# Patient Record
Sex: Male | Born: 1975 | Race: White | Hispanic: No | Marital: Married | Smoking: Current every day smoker
Health system: Southern US, Community
[De-identification: ages and names within clinical notes are randomized; demographics above are authoritative.]

## PROBLEM LIST (undated history)

## (undated) DIAGNOSIS — K6812 Psoas muscle abscess: Secondary | ICD-10-CM

## (undated) DIAGNOSIS — Z87828 Personal history of other (healed) physical injury and trauma: Secondary | ICD-10-CM

## (undated) DIAGNOSIS — A4902 Methicillin resistant Staphylococcus aureus infection, unspecified site: Secondary | ICD-10-CM

## (undated) DIAGNOSIS — F1990 Other psychoactive substance use, unspecified, uncomplicated: Secondary | ICD-10-CM

## (undated) DIAGNOSIS — R7881 Bacteremia: Secondary | ICD-10-CM

## (undated) DIAGNOSIS — A4159 Other Gram-negative sepsis: Secondary | ICD-10-CM

## (undated) DIAGNOSIS — B379 Candidiasis, unspecified: Secondary | ICD-10-CM

## (undated) HISTORY — DX: Other gram-negative sepsis: A41.59

## (undated) HISTORY — PX: SKIN BIOPSY: SHX1

## (undated) HISTORY — DX: Other psychoactive substance use, unspecified, uncomplicated: F19.90

## (undated) HISTORY — DX: Candidiasis, unspecified: B37.9

## (undated) HISTORY — PX: OTHER SURGICAL HISTORY: SHX169

---

## 2002-09-11 ENCOUNTER — Emergency Department (HOSPITAL_COMMUNITY): Admission: EM | Admit: 2002-09-11 | Discharge: 2002-09-11 | Payer: Self-pay | Admitting: Emergency Medicine

## 2003-05-25 ENCOUNTER — Inpatient Hospital Stay (HOSPITAL_COMMUNITY): Admission: AC | Admit: 2003-05-25 | Discharge: 2003-05-26 | Payer: Self-pay

## 2003-06-09 ENCOUNTER — Emergency Department (HOSPITAL_COMMUNITY): Admission: EM | Admit: 2003-06-09 | Discharge: 2003-06-09 | Payer: Self-pay | Admitting: Emergency Medicine

## 2003-07-23 ENCOUNTER — Encounter: Payer: Self-pay | Admitting: Emergency Medicine

## 2003-07-23 ENCOUNTER — Emergency Department (HOSPITAL_COMMUNITY): Admission: EM | Admit: 2003-07-23 | Discharge: 2003-07-23 | Payer: Self-pay | Admitting: Emergency Medicine

## 2007-08-30 ENCOUNTER — Emergency Department (HOSPITAL_COMMUNITY): Admission: EM | Admit: 2007-08-30 | Discharge: 2007-08-30 | Payer: Self-pay | Admitting: Emergency Medicine

## 2007-08-31 ENCOUNTER — Emergency Department (HOSPITAL_COMMUNITY): Admission: EM | Admit: 2007-08-31 | Discharge: 2007-08-31 | Payer: Self-pay | Admitting: Emergency Medicine
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
## 2012-02-10 ENCOUNTER — Emergency Department (HOSPITAL_COMMUNITY): Payer: Self-pay

## 2012-02-10 ENCOUNTER — Emergency Department (HOSPITAL_COMMUNITY)
Admission: EM | Admit: 2012-02-10 | Discharge: 2012-02-11 | Disposition: A | Discharge status: Home / Self Care | Payer: Self-pay | Attending: Emergency Medicine | Admitting: Emergency Medicine

## 2012-02-10 ENCOUNTER — Encounter (HOSPITAL_COMMUNITY): Payer: Self-pay | Admitting: *Deleted

## 2012-02-10 DIAGNOSIS — S8001XA Contusion of right knee, initial encounter: Secondary | ICD-10-CM

## 2012-02-10 DIAGNOSIS — F17200 Nicotine dependence, unspecified, uncomplicated: Secondary | ICD-10-CM | POA: Insufficient documentation

## 2012-02-10 DIAGNOSIS — S8000XA Contusion of unspecified knee, initial encounter: Secondary | ICD-10-CM | POA: Insufficient documentation

## 2012-02-10 DIAGNOSIS — Y990 Civilian activity done for income or pay: Secondary | ICD-10-CM | POA: Insufficient documentation

## 2012-02-10 DIAGNOSIS — Y9289 Other specified places as the place of occurrence of the external cause: Secondary | ICD-10-CM | POA: Insufficient documentation

## 2012-02-10 DIAGNOSIS — M25569 Pain in unspecified knee: Secondary | ICD-10-CM | POA: Insufficient documentation

## 2012-02-10 DIAGNOSIS — W138XXA Fall from, out of or through other building or structure, initial encounter: Secondary | ICD-10-CM | POA: Insufficient documentation

## 2012-02-10 NOTE — ED Notes (Signed)
Pt reports he fell off of a roof 2 days ago, no c/o pain to rt knee

## 2012-02-11 MED ORDER — HYDROCODONE-ACETAMINOPHEN 5-325 MG PO TABS
2.0000 | ORAL_TABLET | Freq: Once | ORAL | Status: AC
  Administered 2012-02-11: 2 via ORAL
  Filled 2012-02-11: qty 2

## 2012-02-11 MED ORDER — HYDROCODONE-ACETAMINOPHEN 5-500 MG PO TABS: 1.0000 | ORAL_TABLET | Freq: Four times a day (QID) | ORAL | Status: AC | PRN

## 2012-02-11 NOTE — ED Notes (Signed)
Discharge instructions given and reviewed with patient.  Prescription given for Vicodin; effects and use explained.  Patient verbalized understanding of sedating effects of Vicodin.  Patient ambulatory; discharged home in good condition. 

## 2012-02-11 NOTE — Discharge Instructions (Signed)
Bone Bruise  A bone bruise is a small hidden fracture of the bone. It typically occurs with bones located close to the surface of the skin.  SYMPTOMS  The pain lasts longer than a normal bruise.   The bruised area is difficult to use.   There may be discoloration or swelling of the bruised area.   When a bone bruise is found with injury to the anterior cruciate ligament (in the knee) there is often an increased:   Amount of fluid in the knee   Time the fluid in the knee lasts.   Number of days until you are walking normally and regaining the motion you had before the injury.   Number of days with pain from the injury.  DIAGNOSIS  It can only be seen on X-rays known as MRIs. This stands for magnetic resonance imaging. A regular X-ray taken of a bone bruise would appear to be normal. A bone bruise is a common injury in the knee and the heel bone (calcaneus). The problems are similar to those produced by stress fractures, which are bone injuries caused by overuse. A bone bruise may also be a sign of other injuries. For example, bone bruises are commonly found where an anterior cruciate ligament (ACL) in the knee has been pulled away from the bone (ruptured). A ligament is a tough fibrous material that connects bones together to make our joints stable. Bruises of the bone last a lot longer than bruises of the muscle or tissues beneath the skin. Bone bruises can last from days to months and are often more severe and painful than other bruises. TREATMENT Because bone bruises are sudden injuries you cannot often prevent them, other than by being extremely careful. Some things you can do to improve the condition are:  Apply ice to the sore area for 15 to 20 minutes, 3 to 4 times per day while awake for the first 2 days. Put the ice in a plastic bag, and place a towel between the bag of ice and your skin.   Keep your bruised area raised (elevated) when possible to lessen swelling.   For activity:     Use crutches when necessary; do not put weight on the injured leg until you are no longer tender.   You may walk on your affected part as the pain allows, or as instructed.   Start weight bearing gradually on the bruised part.   Continue to use crutches or a cane until you can stand without causing pain, or as instructed.   If a plaster splint was applied, wear the splint until you are seen for a follow-up examination. Rest it on nothing harder than a pillow the first 24 hours. Do not put weight on it. Do not get it wet. You may take it off to take a shower or bath.   If an air splint was applied, more air may be blown into or out of the splint as needed for comfort. You may take it off at night and to take a shower or bath.   Wiggle your toes in the splint several times per day if you are able.   You may have been given an elastic bandage to use with the plaster splint or alone. The splint is too tight if you have numbness, tingling or if your foot becomes cold and blue. Adjust the bandage to make it comfortable.   Only take over-the-counter or prescription medicines for pain, discomfort, or fever as directed by   discomfort, or fever as directed by your caregiver.   Follow all instructions for follow up with your caregiver. This includes any orthopedic referrals, physical therapy, and rehabilitation. Any delay in obtaining necessary care could result in a delay or failure of the bones to heal.  SEEK MEDICAL CARE IF:    You have an increase in bruising, swelling, or pain.   You notice coldness of your toes.   You do not get pain relief with medications.  SEEK IMMEDIATE MEDICAL CARE IF:    Your toes are numb or blue.   You have severe pain not controlled with medications.   If any of the problems that caused you to seek care are becoming worse.  Document Released: 01/29/2004 Document Revised: 10/28/2011 Document Reviewed: 06/12/2008  [employer] Patient Information 2012 [employer].

## 2012-02-11 NOTE — ED Provider Notes (Signed)
History     CSN: [id number]  Arrival date & time 02/10/12  2314   First MD Initiated Contact with Patient 02/11/12 0026      Chief Complaint  Patient presents with  . Knee Pain    (Consider location/radiation/quality/duration/timing/severity/associated sxs/prior treatment) HPI Comments: States fell off roof at work 2 days ago.  Landed on knee.  Pain getting worse.    Patient is a 36 y.o. male presenting with knee pain. The history is provided by the patient.  Knee Pain This is a new problem. The current episode started 2 days ago. The problem occurs constantly. The problem has been gradually worsening. The symptoms are aggravated by twisting and walking. The symptoms are relieved by nothing. He has tried nothing for the symptoms.    History reviewed. No pertinent past medical history.  History reviewed. No pertinent past surgical history.  No family history on file.  History  Substance Use Topics  . Smoking status: Current Everyday Smoker  . Smokeless tobacco: Not on file  . Alcohol Use: No      Review of Systems  All other systems reviewed and are negative.    Allergies  Review of patient's allergies indicates no known allergies.  Home Medications  No current outpatient prescriptions on file.  BP 129/77  Pulse 103  Temp(Src) 97.9 F (36.6 C) (Oral)  Resp 20  Ht 6' (1.829 m)  Wt 215 lb (97.523 kg)  BMI 29.16 kg/m2  SpO2 98%  Physical Exam  Nursing note and vitals reviewed. Constitutional: He is oriented to person, place, and time. He appears well-developed and well-nourished.  HENT:  Head: Normocephalic and atraumatic.  Neck: Normal range of motion. Neck supple.  Musculoskeletal:       The right knee appears grossly normal.  There is no deformity or effusion.  There is pain with full extension.  It is stable AP and laterally.  The AP drawer tests are negative.    Neurological: He is alert and oriented to person, place, and time.  Skin: Skin is warm  and dry.    ED Course  Procedures (including critical care time)  Labs Reviewed - No data to display No results found.   No diagnosis found.    MDM  To follow up if not improving in 1-2 weeks.        Geoffery Lyons, MD 02/11/12 [id number]

## 2012-02-24 ENCOUNTER — Encounter (HOSPITAL_COMMUNITY): Payer: Self-pay | Admitting: *Deleted

## 2012-02-24 ENCOUNTER — Emergency Department (HOSPITAL_COMMUNITY)
Admission: EM | Admit: 2012-02-24 | Discharge: 2012-02-24 | Disposition: A | Discharge status: Home / Self Care | Payer: Self-pay | Attending: Emergency Medicine | Admitting: Emergency Medicine

## 2012-02-24 DIAGNOSIS — M25569 Pain in unspecified knee: Secondary | ICD-10-CM | POA: Insufficient documentation

## 2012-02-24 DIAGNOSIS — M25561 Pain in right knee: Secondary | ICD-10-CM

## 2012-02-24 DIAGNOSIS — F17200 Nicotine dependence, unspecified, uncomplicated: Secondary | ICD-10-CM | POA: Insufficient documentation

## 2012-02-24 MED ORDER — PREDNISONE 10 MG PO TABS: ORAL_TABLET | ORAL | Status: DC

## 2012-02-24 MED ORDER — OXYCODONE-ACETAMINOPHEN 5-325 MG PO TABS
1.0000 | ORAL_TABLET | Freq: Once | ORAL | Status: AC
  Administered 2012-02-24: 1 via ORAL
  Filled 2012-02-24: qty 1

## 2012-02-24 MED ORDER — OXYCODONE-ACETAMINOPHEN 5-325 MG PO TABS: 1.0000 | ORAL_TABLET | ORAL | Status: DC | PRN

## 2012-02-24 NOTE — ED Provider Notes (Signed)
History     CSN: [id number]  Arrival date & time 02/24/12  2050   First MD Initiated Contact with Patient 02/24/12 2059      Chief Complaint  Patient presents with  . Knee Pain    (Consider location/radiation/quality/duration/timing/severity/associated sxs/prior treatment) HPI Comments: Patient fell off a roof last month landing directly on his right knee.  He was seen here for this injury and his initial xray was negative for fracture.  He continues to have pain in the knee,  Especially when he works,  The pain worsens after on his feet for several hours.   Patient is a 36 y.o. male presenting with knee pain. The history is provided by the patient.  Knee Pain This is a chronic problem. The current episode started more than 1 month ago. The problem occurs daily. The problem has been unchanged. Associated symptoms include arthralgias and joint swelling. Pertinent negatives include no abdominal pain, chest pain, fever, neck pain, numbness, rash or weakness. The symptoms are aggravated by walking, twisting and standing. He has tried oral narcotics and NSAIDs for the symptoms. The treatment provided mild relief.    History reviewed. No pertinent past medical history.  History reviewed. No pertinent past surgical history.  Family History  Problem Relation Age of Onset  . Cancer Father     History  Substance Use Topics  . Smoking status: Current Everyday Smoker  . Smokeless tobacco: Not on file  . Alcohol Use: No      Review of Systems  Constitutional: Negative for fever.  HENT: Negative for neck pain.   Respiratory: Negative for shortness of breath.   Cardiovascular: Negative for chest pain.  Gastrointestinal: Negative for abdominal pain.  Musculoskeletal: Positive for joint swelling and arthralgias.  Skin: Negative.  Negative for rash and wound.  Neurological: Negative for weakness and numbness.  Psychiatric/Behavioral: Negative.     Allergies  Vicodin  Home  Medications   Current Outpatient Rx  Name Route Sig Dispense Refill  . IBUPROFEN 200 MG PO TABS Oral Take 800 mg by mouth as needed. For pain    . OXYCODONE-ACETAMINOPHEN 5-325 MG PO TABS Oral Take 1 tablet by mouth every 4 (four) hours as needed for pain. 15 tablet 0  . PREDNISONE 10 MG PO TABS  6, 5, 4, 3, 2 then 1 tablet by mouth daily for 6 days total. 21 tablet 0    BP 139/84  Pulse 86  Temp(Src) 97.9 F (36.6 C) (Oral)  Resp 20  Ht 6' (1.829 m)  Wt 210 lb (95.255 kg)  BMI 28.48 kg/m2  SpO2 100%  Physical Exam  Nursing note and vitals reviewed. Constitutional: He is oriented to person, place, and time. He appears well-developed and well-nourished.  HENT:  Head: Normocephalic.  Eyes: Conjunctivae are normal.  Neck: Normal range of motion.  Cardiovascular: Normal rate and intact distal pulses.  Exam reveals no decreased pulses.   Pulses:      Dorsalis pedis pulses are 2+ on the right side, and 2+ on the left side.       Posterior tibial pulses are 2+ on the right side, and 2+ on the left side.  Pulmonary/Chest: Effort normal.  Musculoskeletal: He exhibits tenderness. He exhibits no edema.       Right knee: He exhibits bony tenderness. He exhibits normal range of motion, no effusion, no deformity, no LCL laxity and no MCL laxity. tenderness found. Lateral joint line tenderness noted.  Neurological: He is alert and oriented  to person, place, and time. No sensory deficit.  Skin: Skin is warm, dry and intact.    ED Course  Procedures (including critical care time)  Labs Reviewed - No data to display No results found.   1. Knee pain, right       MDM  Pt given prednisone taper,  Percocet for pain relief.  Referral to Dr. Hilda Lias for further management.          Candis Musa, PA 02/25/12 1151

## 2012-02-24 NOTE — ED Notes (Signed)
Fell off roof 1 month ago ,injurying rt knee.  Here because it continues to hurt.

## 2012-02-24 NOTE — Discharge Instructions (Signed)
Knee Pain The knee is the complex joint between your thigh and your lower leg. It is made up of bones, tendons, ligaments, and cartilage. The bones that make up the knee are:  The femur in the thigh.   The tibia and fibula in the lower leg.   The patella or kneecap riding in the groove on the lower femur.  CAUSES  Knee pain is a common complaint with many causes. A few of these causes are:  Injury, such as:   A ruptured ligament or tendon injury.   Torn cartilage.   Medical conditions, such as:   Gout   Arthritis   Infections   Overuse, over training or overdoing a physical activity.  Knee pain can be minor or severe. Knee pain can accompany debilitating injury. Minor knee problems often respond well to self-care measures or get well on their own. More serious injuries may need medical intervention or even surgery. SYMPTOMS The knee is complex. Symptoms of knee problems can vary widely. Some of the problems are:  Pain with movement and weight bearing.   Swelling and tenderness.   Buckling of the knee.   Inability to straighten or extend your knee.   Your knee locks and you cannot straighten it.   Warmth and redness with pain and fever.   Deformity or dislocation of the kneecap.  DIAGNOSIS  Determining what is wrong may be very straight forward such as when there is an injury. It can also be challenging because of the complexity of the knee. Tests to make a diagnosis may include:  Your caregiver taking a history and doing a physical exam.   Routine X-rays can be used to rule out other problems. X-rays will not reveal a cartilage tear. Some injuries of the knee can be diagnosed by:   Arthroscopy a surgical technique by which a small video camera is inserted through tiny incisions on the sides of the knee. This procedure is used to examine and repair internal knee joint problems. Tiny instruments can be used during arthroscopy to repair the torn knee cartilage  (meniscus).   Arthrography is a radiology technique. A contrast liquid is directly injected into the knee joint. Internal structures of the knee joint then become visible on X-ray film.   An MRI scan is a non x-ray radiology procedure in which magnetic fields and a computer produce two- or three-dimensional images of the inside of the knee. Cartilage tears are often visible using an MRI scanner. MRI scans have largely replaced arthrography in diagnosing cartilage tears of the knee.   Blood work.   Examination of the fluid that helps to lubricate the knee joint (synovial fluid). This is done by taking a sample out using a needle and a syringe.  TREATMENT The treatment of knee problems depends on the cause. Some of these treatments are:  Depending on the injury, proper casting, splinting, surgery or physical therapy care will be needed.   Give yourself adequate recovery time. Do not overuse your joints. If you begin to get sore during workout routines, back off. Slow down or do fewer repetitions.   For repetitive activities such as cycling or running, maintain your strength and nutrition.   Alternate muscle groups. For example if you are a weight lifter, work the upper body on one day and the lower body the next.   Either tight or weak muscles do not give the proper support for your knee. Tight or weak muscles do not absorb the stress placed   on the knee joint. Keep the muscles surrounding the knee strong.   Take care of mechanical problems.   If you have flat feet, orthotics or special shoes may help. See your caregiver if you need help.   Arch supports, sometimes with wedges on the inner or outer aspect of the heel, can help. These can shift pressure away from the side of the knee most bothered by osteoarthritis.   A brace called an "unloader" brace also may be used to help ease the pressure on the most arthritic side of the knee.   If your caregiver has prescribed crutches, braces,  wraps or ice, use as directed. The acronym for this is PRICE. This means protection, rest, ice, compression and elevation.   Nonsteroidal anti-inflammatory drugs (NSAID's), can help relieve pain. But if taken immediately after an injury, they may actually increase swelling. Take NSAID's with food in your stomach. Stop them if you develop stomach problems. Do not take these if you have a history of ulcers, stomach pain or bleeding from the bowel. Do not take without your caregiver's approval if you have problems with fluid retention, heart failure, or kidney problems.   For ongoing knee problems, physical therapy may be helpful.   Glucosamine and chondroitin are over-the-counter dietary supplements. Both may help relieve the pain of osteoarthritis in the knee. These medicines are different from the usual anti-inflammatory drugs. Glucosamine may decrease the rate of cartilage destruction.   Injections of a corticosteroid drug into your knee joint may help reduce the symptoms of an arthritis flare-up. They may provide pain relief that lasts a few months. You may have to wait a few months between injections. The injections do have a small increased risk of infection, water retention and elevated blood sugar levels.   Hyaluronic acid injected into damaged joints may ease pain and provide lubrication. These injections may work by reducing inflammation. A series of shots may give relief for as long as 6 months.   Topical painkillers. Applying certain ointments to your skin may help relieve the pain and stiffness of osteoarthritis. Ask your pharmacist for suggestions. Many over the-counter products are approved for temporary relief of arthritis pain.   In some countries, doctors often prescribe topical NSAID's for relief of chronic conditions such as arthritis and tendinitis. A review of treatment with NSAID creams found that they worked as well as oral medications but without the serious side effects.    PREVENTION  Maintain a healthy weight. Extra pounds put more strain on your joints.   Get strong, stay limber. Weak muscles are a common cause of knee injuries. Stretching is important. Include flexibility exercises in your workouts.   Be smart about exercise. If you have osteoarthritis, chronic knee pain or recurring injuries, you may need to change the way you exercise. This does not mean you have to stop being active. If your knees ache after jogging or playing basketball, consider switching to swimming, water aerobics or other low-impact activities, at least for a few days a week. Sometimes limiting high-impact activities will provide relief.   Make sure your shoes fit well. Choose footwear that is right for your sport.   Protect your knees. Use the proper gear for knee-sensitive activities. Use kneepads when playing volleyball or laying carpet. Buckle your seat belt every time you drive. Most shattered kneecaps occur in car accidents.   Rest when you are tired.  SEEK MEDICAL CARE IF:  You have knee pain that is continual and does not   seem to be getting better.  SEEK IMMEDIATE MEDICAL CARE IF:  Your knee joint feels hot to the touch and you have a high fever. MAKE SURE YOU:   Understand these instructions.   Will watch your condition.   Will get help right away if you are not doing well or get worse.  Document Released: 09/05/2007 Document Revised: 10/28/2011 Document Reviewed: 09/05/2007 [facility] Patient Information 2012 [place], [place].   As discussed call Dr. Hilda Lias for further management if your knee injury.  You may try the prednisone taper which may help relieve inflammation.  Use oxycodone before bed time for pain relief.  You may use at other times also, but use caution as this will make you drowsy.  Do not drive within 4 hours of taking this medication.

## 2012-02-27 NOTE — ED Provider Notes (Signed)
Evaluation and management procedures were performed by the PA/NP/Resident Physician under my supervision/collaboration.   Catarino Vold D Khamia Stambaugh, MD 02/27/12 1605 

## 2012-02-28 ENCOUNTER — Encounter (HOSPITAL_COMMUNITY): Payer: Self-pay | Admitting: *Deleted

## 2012-02-28 ENCOUNTER — Emergency Department (HOSPITAL_COMMUNITY)
Admission: EM | Admit: 2012-02-28 | Discharge: 2012-02-28 | Disposition: A | Discharge status: Home / Self Care | Payer: Self-pay | Attending: Emergency Medicine | Admitting: Emergency Medicine

## 2012-02-28 DIAGNOSIS — M25469 Effusion, unspecified knee: Secondary | ICD-10-CM | POA: Insufficient documentation

## 2012-02-28 DIAGNOSIS — S8990XA Unspecified injury of unspecified lower leg, initial encounter: Secondary | ICD-10-CM | POA: Insufficient documentation

## 2012-02-28 DIAGNOSIS — Y990 Civilian activity done for income or pay: Secondary | ICD-10-CM | POA: Insufficient documentation

## 2012-02-28 DIAGNOSIS — S8991XA Unspecified injury of right lower leg, initial encounter: Secondary | ICD-10-CM

## 2012-02-28 DIAGNOSIS — W138XXA Fall from, out of or through other building or structure, initial encounter: Secondary | ICD-10-CM | POA: Insufficient documentation

## 2012-02-28 DIAGNOSIS — M25569 Pain in unspecified knee: Secondary | ICD-10-CM | POA: Insufficient documentation

## 2012-02-28 DIAGNOSIS — F17200 Nicotine dependence, unspecified, uncomplicated: Secondary | ICD-10-CM | POA: Insufficient documentation

## 2012-02-28 MED ORDER — IBUPROFEN 600 MG PO TABS: 600.0000 mg | ORAL_TABLET | Freq: Four times a day (QID) | ORAL | Status: AC | PRN

## 2012-02-28 MED ORDER — OXYCODONE-ACETAMINOPHEN 5-325 MG PO TABS: 1.0000 | ORAL_TABLET | ORAL | Status: AC | PRN

## 2012-02-28 NOTE — ED Notes (Signed)
Here for recheck of knee, seen here for same and has been referred to ortho.

## 2012-02-28 NOTE — Discharge Instructions (Signed)
Expect to here from Camillia Herter,  Our nursing director,  Who may be able to assist you with getting the specialty orthopedic care you need.  Also,  You may want to contact Newman Pies ,  Our case manager at [phone] to see if you are eligible for a [facility] assistance program for this injury.

## 2012-02-28 NOTE — ED Provider Notes (Signed)
History     CSN: [id number]  Arrival date & time 02/28/12  1825   First MD Initiated Contact with Patient 02/28/12 1943      Chief Complaint  Patient presents with  . Knee Pain    (Consider location/radiation/quality/duration/timing/severity/associated sxs/prior treatment) HPI Comments: Patient presents with acute right knee pain which he sustained one month ago when he fell off a roof at work.  He has been trying to rest the knee as much as possible, and is wearing a brace which he purchased for support.  His pain is not improved he has significant swelling which is worse towards the end of the day and he feels that his knee is unstable at times.  He has attempted to see both orthopedists in [place],  but has been unable to come up with the co-pay in order to get his medical care.  He is unable to work secondary to pain and swelling.  He is very  frustrated today as he wants to go back to work but has been unable to get the medical care that he needs.  He denies any new injury since his original x-rays completed here.  He does take ibuprofen, has used a course of prednisone and uses when necessary oxycodone which has given a fleeting relief of pain.  Patient is a 36 y.o. male presenting with knee pain. The history is provided by the patient and the spouse.  Knee Pain This is a chronic problem. Episode onset: Patient presents for further assistance of right knee pain which he sustained an injury when he fell off a roof over one month ago. The problem occurs constantly. The problem has been unchanged. Associated symptoms include arthralgias and joint swelling. Pertinent negatives include no abdominal pain, chest pain, congestion, fever, headaches, nausea, neck pain, numbness, rash, sore throat or weakness. The symptoms are aggravated by twisting, walking and standing. He has tried NSAIDs and oral narcotics (He is also wearing a brace for support of his right knee.) for the symptoms. The treatment  provided no relief.    History reviewed. No pertinent past medical history.  History reviewed. No pertinent past surgical history.  Family History  Problem Relation Age of Onset  . Cancer Father     History  Substance Use Topics  . Smoking status: Current Everyday Smoker  . Smokeless tobacco: Not on file  . Alcohol Use: No      Review of Systems  Constitutional: Negative for fever.  HENT: Negative for congestion, sore throat and neck pain.   Eyes: Negative.   Respiratory: Negative for chest tightness and shortness of breath.   Cardiovascular: Negative for chest pain.  Gastrointestinal: Negative for nausea and abdominal pain.  Genitourinary: Negative.   Musculoskeletal: Positive for joint swelling and arthralgias.  Skin: Negative.  Negative for rash and wound.  Neurological: Negative for dizziness, weakness, light-headedness, numbness and headaches.  Hematological: Negative.   Psychiatric/Behavioral: Negative.     Allergies  Vicodin  Home Medications   Current Outpatient Rx  Name Route Sig Dispense Refill  . IBUPROFEN 200 MG PO TABS Oral Take 800 mg by mouth as needed. For pain    . IBUPROFEN 600 MG PO TABS Oral Take 1 tablet (600 mg total) by mouth every 6 (six) hours as needed for pain. 30 tablet 0  . OXYCODONE-ACETAMINOPHEN 5-325 MG PO TABS Oral Take 1 tablet by mouth every 4 (four) hours as needed for pain. 15 tablet 0  . OXYCODONE-ACETAMINOPHEN 5-325 MG PO TABS  Oral Take 1 tablet by mouth every 4 (four) hours as needed for pain. 30 tablet 0  . PREDNISONE 10 MG PO TABS  6, 5, 4, 3, 2 then 1 tablet by mouth daily for 6 days total. 21 tablet 0    BP 127/93  Pulse 100  Temp(Src) 98.5 F (36.9 C) (Oral)  Resp 20  Ht 6' (1.829 m)  Wt 210 lb (95.255 kg)  BMI 28.48 kg/m2  SpO2 100%  Physical Exam  Nursing note and vitals reviewed. Constitutional: He is oriented to person, place, and time. He appears well-developed and well-nourished.  HENT:  Head:  Normocephalic.  Eyes: Conjunctivae are normal.  Neck: Normal range of motion.  Cardiovascular: Normal rate and intact distal pulses.  Exam reveals no decreased pulses.   Pulses:      Dorsalis pedis pulses are 2+ on the right side, and 2+ on the left side.       Less than 2 second cap refill.  Pulmonary/Chest: Effort normal.  Musculoskeletal: He exhibits edema and tenderness.       Right knee: He exhibits bony tenderness. He exhibits normal meniscus. tenderness found. Lateral joint line tenderness noted. No MCL and no LCL tenderness noted.  Neurological: He is alert and oriented to person, place, and time. No sensory deficit.  Skin: Skin is warm, dry and intact.    ED Course  Procedures (including critical care time)  Labs Reviewed - No data to display No results found.   1. Right knee injury       MDM  Short-term assistance for patient given including renewal of prescription for oxycodone and ibuprofen 800 mg tablets.  Patient placed in knee immobilizer and given crutches for improved protection of knee while he is attempting to get resolution of this injury.  Also  advised patient that we will let our  nursing director be aware of his difficulty getting in with the specialist and she may be able to help him with this problem.  Patient also given [facility] Parents phone number for assistance for our hospital financial services.    Candis Musa, PA 02/28/12 2043  Candis Musa, PA 02/28/12 2044  Candis Musa, PA 02/28/12 2046

## 2012-02-29 NOTE — ED Provider Notes (Signed)
Medical screening examination/treatment/procedure(s) were performed by non-physician practitioner and as supervising physician I was immediately available for consultation/collaboration. Devoria Albe, MD, FACEP   Ward Givens, MD 02/29/12 [phone]

## 2012-03-10 ENCOUNTER — Encounter (HOSPITAL_COMMUNITY): Payer: Self-pay | Admitting: Emergency Medicine

## 2012-03-10 ENCOUNTER — Emergency Department (HOSPITAL_COMMUNITY)
Admission: EM | Admit: 2012-03-10 | Discharge: 2012-03-11 | Disposition: A | Discharge status: Home / Self Care | Payer: Self-pay | Attending: Emergency Medicine | Admitting: Emergency Medicine

## 2012-03-10 DIAGNOSIS — W138XXA Fall from, out of or through other building or structure, initial encounter: Secondary | ICD-10-CM | POA: Insufficient documentation

## 2012-03-10 DIAGNOSIS — F17200 Nicotine dependence, unspecified, uncomplicated: Secondary | ICD-10-CM | POA: Insufficient documentation

## 2012-03-10 DIAGNOSIS — S8990XA Unspecified injury of unspecified lower leg, initial encounter: Secondary | ICD-10-CM | POA: Insufficient documentation

## 2012-03-10 DIAGNOSIS — S8991XA Unspecified injury of right lower leg, initial encounter: Secondary | ICD-10-CM

## 2012-03-10 DIAGNOSIS — S99929A Unspecified injury of unspecified foot, initial encounter: Secondary | ICD-10-CM | POA: Insufficient documentation

## 2012-03-10 NOTE — ED Notes (Signed)
Pt reports R knee pain described as stabbing for a little over a month after falling off roof; pt reports it has been getting worse since injury; pt reports having xray done immediately after injury- reports was told it was bruised; pt has been using brace at home; was referred to orthopedic but unable to go d/t cost; pt able to bend knee; wants MRI

## 2012-03-11 MED ORDER — OXYCODONE-ACETAMINOPHEN 5-325 MG PO TABS: 2.0000 | ORAL_TABLET | ORAL | Status: DC | PRN

## 2012-03-11 NOTE — ED Provider Notes (Signed)
Medical screening examination/treatment/procedure(s) were performed by non-physician practitioner and as supervising physician I was immediately available for consultation/collaboration.  Aissata Wilmore, MD 03/11/12 0342 

## 2012-03-11 NOTE — ED Provider Notes (Signed)
History     CSN: [id number]  Arrival date & time 03/10/12  2319   First MD Initiated Contact with Patient 03/11/12 [phone]      Chief Complaint  Patient presents with  . Knee Pain    (Consider location/radiation/quality/duration/timing/severity/associated sxs/prior treatment) Patient is a 36 y.o. male presenting with knee pain. The history is provided by the patient and the spouse.  Knee Pain This is a new problem. The current episode started 1 to 4 weeks ago. Associated symptoms comments: He fell from a roof approximately one month ago and landed on flexed right knee. He was evaluated and reports the knee x-ray did not show any fractures. He continues to have severe pain in the knee with bending. He has been able to support his weight and uses the knee immobilizer daily while working. The pain is worse at night when he lies down, and when the knee remains bent for periods of time and he straightens. No swelling.Marland Kitchen    History reviewed. No pertinent past medical history.  Past Surgical History  Procedure Date  . Skin biopsy     Family History  Problem Relation Age of Onset  . Cancer Father     History  Substance Use Topics  . Smoking status: Current Everyday Smoker -- 1.0 packs/day  . Smokeless tobacco: Not on file  . Alcohol Use: No      Review of Systems  Respiratory: Negative.  Negative for shortness of breath.   Musculoskeletal:       See HPI  Skin: Negative.     Allergies  Vicodin  Home Medications   Current Outpatient Rx  Name Route Sig Dispense Refill  . ACETAMINOPHEN 500 MG PO TABS Oral Take 1,000 mg by mouth every 6 (six) hours as needed. For pain    . ASPIRIN-ACETAMINOPHEN-CAFFEINE 250-250-65 MG PO TABS Oral Take 1 tablet by mouth every 6 (six) hours as needed. For pain    . IBUPROFEN 600 MG PO TABS Oral Take 1,200 mg by mouth every 6 (six) hours as needed. For pain      BP 139/96  Pulse 82  Temp(Src) 98.2 F (36.8 C) (Oral)  Resp 20  SpO2  100%  Physical Exam  Constitutional: He appears well-developed and well-nourished.  Neck: Normal range of motion.  Pulmonary/Chest: Effort normal.  Musculoskeletal: Normal range of motion.       Right knee unremarkable in appearance. Joint stable. Point tenderness to lower portion of anterolateral aspect. No calf or thigh tenderness.   Neurological: He is alert.  Skin: Skin is warm and dry.  Psychiatric: He has a normal mood and affect.    ED Course  Procedures (including critical care time)  Labs Reviewed - No data to display No results found.   No diagnosis found. 1. Knee pain and injury   MDM  The patient continues to have significant pain and loss of function due to pain. He has attempted to follow up with orthopedic referral but it has been cost prohibitive. He is currently working with [facility] staff to obtain assistance for follow up as well as applying for Medicaid. No workman's comp. Insurance available. He shows diligence in attempting to obtain care. He uses Percocet only at night for pain relief as he continues to work during the day and cannot use it. Will attempt to arrange outpatient MRI for him to expedite reasonable care until he obtains assistance to be seen by orthopedics.  Rodena Medin, PA-C 03/11/12 0131

## 2012-03-11 NOTE — Discharge Instructions (Signed)
CONTINUE YOUR EFFORTS AT OBTAINING ASSISTANCE FOR ORTHOPEDIC FOLLOW UP THROUGH [facility]. AS AN ALTERNATIVE CHOICE, CALL DR. BLACKMAN'S OFFICE TO INQUIRE ABOUT AN APPOINTMENT FOR FURTHER CARE. YOU CAN EXPECT TO HEAR FROM RADIOLOGY AT  REGARDING THE SCHEDULING OF AN APPOINTMENT TO HAVE AN OUTPATIENT MRI OF YOUR RIGHT KNEE.

## 2012-03-13 ENCOUNTER — Other Ambulatory Visit (HOSPITAL_COMMUNITY): Payer: Self-pay | Admitting: Emergency Medicine

## 2012-03-13 DIAGNOSIS — R52 Pain, unspecified: Secondary | ICD-10-CM

## 2012-03-13 DIAGNOSIS — T1490XA Injury, unspecified, initial encounter: Secondary | ICD-10-CM

## 2012-03-13 NOTE — ED Notes (Signed)
Talked with Marlon Pel, PA about MRI and she states that it should be done without contrast. Calling outpt mri with order.

## 2012-03-16 ENCOUNTER — Ambulatory Visit (HOSPITAL_COMMUNITY)
Admission: RE | Admit: 2012-03-16 | Discharge: 2012-03-16 | Disposition: A | Discharge status: Home / Self Care | Payer: Self-pay | Source: Ambulatory Visit | Attending: Emergency Medicine | Admitting: Emergency Medicine

## 2012-03-16 ENCOUNTER — Other Ambulatory Visit (HOSPITAL_COMMUNITY): Payer: Self-pay | Admitting: Emergency Medicine

## 2012-03-16 DIAGNOSIS — Z9181 History of falling: Secondary | ICD-10-CM | POA: Insufficient documentation

## 2012-03-16 DIAGNOSIS — T1490XA Injury, unspecified, initial encounter: Secondary | ICD-10-CM

## 2012-03-16 DIAGNOSIS — R52 Pain, unspecified: Secondary | ICD-10-CM

## 2012-03-16 DIAGNOSIS — R609 Edema, unspecified: Secondary | ICD-10-CM | POA: Insufficient documentation

## 2012-03-16 DIAGNOSIS — M25569 Pain in unspecified knee: Secondary | ICD-10-CM | POA: Insufficient documentation

## 2012-03-16 DIAGNOSIS — M7120 Synovial cyst of popliteal space [Baker], unspecified knee: Secondary | ICD-10-CM | POA: Insufficient documentation

## 2012-03-17 ENCOUNTER — Emergency Department (HOSPITAL_COMMUNITY)
Admission: EM | Admit: 2012-03-17 | Discharge: 2012-03-17 | Disposition: A | Discharge status: Home / Self Care | Payer: Self-pay

## 2012-03-17 ENCOUNTER — Encounter (HOSPITAL_COMMUNITY): Payer: Self-pay | Admitting: *Deleted

## 2012-03-17 ENCOUNTER — Emergency Department (HOSPITAL_COMMUNITY)
Admission: EM | Admit: 2012-03-17 | Discharge: 2012-03-18 | Disposition: A | Discharge status: Home / Self Care | Payer: Self-pay | Attending: Emergency Medicine | Admitting: Emergency Medicine

## 2012-03-17 DIAGNOSIS — M2420 Disorder of ligament, unspecified site: Secondary | ICD-10-CM | POA: Insufficient documentation

## 2012-03-17 DIAGNOSIS — Z79899 Other long term (current) drug therapy: Secondary | ICD-10-CM | POA: Insufficient documentation

## 2012-03-17 DIAGNOSIS — M7630 Iliotibial band syndrome, unspecified leg: Secondary | ICD-10-CM

## 2012-03-17 DIAGNOSIS — M25569 Pain in unspecified knee: Secondary | ICD-10-CM | POA: Insufficient documentation

## 2012-03-17 DIAGNOSIS — M629 Disorder of muscle, unspecified: Secondary | ICD-10-CM | POA: Insufficient documentation

## 2012-03-17 NOTE — ED Notes (Signed)
No answer in waiting room 

## 2012-03-17 NOTE — ED Notes (Signed)
The pt has had rt knee pain for one month when he fell off a roof.   He has a mri  Report with him.  He is c/o pain and he wants something done for this

## 2012-03-17 NOTE — ED Notes (Signed)
No answer in waiting room X3 

## 2012-03-17 NOTE — ED Notes (Signed)
No answer in triage.

## 2012-03-18 MED ORDER — NAPROXEN 500 MG PO TABS: 500.0000 mg | ORAL_TABLET | Freq: Two times a day (BID) | ORAL | Status: DC

## 2012-03-18 MED ORDER — NAPROXEN 250 MG PO TABS
500.0000 mg | ORAL_TABLET | Freq: Once | ORAL | Status: AC
  Administered 2012-03-18: 500 mg via ORAL
  Filled 2012-03-18: qty 2

## 2012-03-18 MED ORDER — OXYCODONE-ACETAMINOPHEN 5-325 MG PO TABS: 2.0000 | ORAL_TABLET | ORAL | Status: AC | PRN

## 2012-03-18 MED ORDER — PREDNISONE 20 MG PO TABS: 60.0000 mg | ORAL_TABLET | Freq: Every day | ORAL | Status: AC

## 2012-03-18 MED ORDER — PREDNISONE 20 MG PO TABS
60.0000 mg | ORAL_TABLET | Freq: Once | ORAL | Status: AC
  Administered 2012-03-18: 60 mg via ORAL
  Filled 2012-03-18: qty 3

## 2012-03-18 MED ORDER — OXYCODONE-ACETAMINOPHEN 5-325 MG PO TABS
2.0000 | ORAL_TABLET | Freq: Once | ORAL | Status: AC
  Administered 2012-03-18: 2 via ORAL
  Filled 2012-03-18: qty 2

## 2012-03-18 NOTE — ED Provider Notes (Signed)
History     CSN: [id number]  Arrival date & time 03/17/12  2101   First MD Initiated Contact with Patient 03/17/12 2309      Chief Complaint  Patient presents with  . Knee Pain    (Consider location/radiation/quality/duration/timing/severity/associated sxs/prior treatment) HPI 36 year old male presents emergency department complaining of persistent right knee pain. Patient reports he fell off a roof approximately one month ago landing on a rock. Since that time patient has had persistent pain in the lateral aspect of his right knee. Pain is worse at night and worse with flexion and extension of the knee. Patient had MRI done yesterday which showed soft tissue swelling around the IT band. Patient has been unable to followup with orthopedics due to not having cash up front. Patient is frustrated with the medical system, is having persistent pain and limited mobility and "wants something done about it" History reviewed. No pertinent past medical history.  Past Surgical History  Procedure Date  . Skin biopsy     Family History  Problem Relation Age of Onset  . Cancer Father     History  Substance Use Topics  . Smoking status: Current Everyday Smoker -- 1.0 packs/day  . Smokeless tobacco: Not on file  . Alcohol Use: No      Review of Systems  All other systems reviewed and are negative.    Allergies  Vicodin  Home Medications   Current Outpatient Rx  Name Route Sig Dispense Refill  . ASPIRIN-ACETAMINOPHEN-CAFFEINE 250-250-65 MG PO TABS Oral Take 1 tablet by mouth every 6 (six) hours as needed. For pain    . IBUPROFEN 600 MG PO TABS Oral Take 600 mg by mouth every 6 (six) hours as needed. For pain    . OXYCODONE-ACETAMINOPHEN 5-325 MG PO TABS Oral Take 1 tablet by mouth once.    Marland Kitchen NAPROXEN 500 MG PO TABS Oral Take 1 tablet (500 mg total) by mouth 2 (two) times daily. 30 tablet 0  . OXYCODONE-ACETAMINOPHEN 5-325 MG PO TABS Oral Take 2 tablets by mouth every 4 (four)  hours as needed for pain. 30 tablet 0  . PREDNISONE 20 MG PO TABS Oral Take 3 tablets (60 mg total) by mouth daily. 15 tablet 0    BP 120/75  Pulse 94  Temp(Src) 98.1 F (36.7 C) (Oral)  Resp 18  SpO2 100%  Physical Exam  Constitutional: He appears well-developed and well-nourished.       Uncomfortable appearing  HENT:  Head: Normocephalic and atraumatic.  Musculoskeletal: He exhibits tenderness (tenderness to palpation over right patellar tendon and right lateral knee without effusion crepitus deformity noted. Patient with scar over right lateral knee without induration warmth or fluctuance pain with flexion and extension of the knee no laxity neg).  Skin: Skin is warm and dry. No rash noted. No erythema.  Psychiatric: He has a normal mood and affect. His behavior is normal. Judgment and thought content normal.  neg ant/post drawer  ED Course  Procedures (including critical care time)  Labs Reviewed - No data to display Dg Eye Foreign Body  03/16/2012  *RADIOLOGY REPORT*  Clinical Data: Metal working/exposure; clearance prior to MRI  ORBITS FOR FOREIGN BODY - 2 VIEW  Comparison:  None.  Findings:  There is no evidence of metallic foreign body within the orbits.  No significant bone abnormality identified.  IMPRESSION: No evidence of metallic foreign body within the orbits.  Original Report Authenticated By: Camelia Phenes, M.D.   Mr Knee Right  Wo Contrast  03/17/2012  *RADIOLOGY REPORT*  Clinical Data: Status post fall 1 month ago.  Continued pain, mainly in the lateral side.  MRI OF THE RIGHT KNEE WITHOUT CONTRAST  Technique:  Multiplanar, multisequence MR imaging was performed. No intravenous contrast was administered.  Comparison: Plain films 02/23/2012 and 02/10/2012.  Findings: The menisci, anterior and posterior cruciate ligaments, medial and lateral collateral ligament complexes and extensor mechanism of the knee are all intact.  There is soft tissue edema deep to the iliotibial  band which is likely due to contusion given history of trauma.  Bone marrow signal is normal.  Hyaline cartilage surfaces are preserved.  Only trace amount of joint fluid is present.  The patient has a very small Baker's cyst.  IMPRESSION:  1.  Soft tissue edema deep to the iliotibial band is likely due to contusion given history of trauma.  IT band syndrome is within the differential. 2.  Negative for meniscal or ligament tear or.  No fracture or bone contusion. 3.  Small Baker's cyst.  Original Report Authenticated By: Bernadene Bell. D'ALESSIO, M.D.     1. Knee pain   2. IT band syndrome       MDM  Patient's MRI shows no meniscal or ligamental damage. Patient with soft tissue swelling deep to the IT band as probable source of pain. I have encouraged patient to continue to try to see orthopedics. We'll place on short course of steroids and other anti-inflammatories as well as pain medication to help with healing        Olivia Mackie, MD 03/18/12 [phone]

## 2012-03-18 NOTE — ED Notes (Signed)
DR. Tollie Eth WITH PT. AT THIS TIME ON PLAN OF CARE.

## 2012-03-18 NOTE — Discharge Instructions (Signed)
Please take medications as prescribed. It is very important for you to followup with orthopedics for further treatment of your knee pain. Rest as much as possible. Given your MRI result is not expect that you'll require surgery and the soft tissue swelling will resolve with time.  Iliotibial Band Syndrome Iliotibial band syndrome is pain in the outer, upper thigh. The pain is due to an inflammation (soreness) of the iliotibial band. This is a band of thick fibrous tissue that runs down the outside of the thigh. The iliotibial band begins at the hip. It extends to the outer side of the shin bone (tibia) just below the knee joint. The band works with the thigh muscles. Together they provide stability to the outside of the knee joint. Iliotibial band syndrome (ITBS) occurs when there is inflammation to this band of tissue. The irritation usually occurs over the outside of the knee joint, at the lateral epicondyle (the end of the femur bone.) The iliotibial band crosses bone and muscle at this point. Between these structures is a bursa (a cushioning sac). The bursa should make possible a smooth gliding motion. However, when inflamed, the iliotibial band does not glide easily. When inflamed, there is pain with motion of the knee. Usually the pain worsens with continued movement and the pain goes away with rest. This problem usually arises when there is a sudden increase in sports activities involving the lower extremities (your legs). Running, and playing soccer or basketball are examples of activities causing this. Others who are prone to ITBS include individuals with mechanical problems such as leg length differences, abnormality of walking, bowed legs etc. This diagnosis (learning what is wrong) is made by examination. X-rays are usually normal if only soft tissue inflammation is present. Treatment of ITBS begins with proper footwear, icing the area of pain, stretching, and resting for a period of time.  Incorporating low-impact cross-training activities may also help. Your caregiver may prescribe anti-inflammatory medications as well. HOME CARE INSTRUCTIONS   Apply ice to the sore area for 15 to 20 minutes, 3 to 4 times per day. Put the ice in a plastic bag and place a towel between the bag of ice and your skin.   Limit excessive training or eliminate training until pain goes away.   While pain is present, you may use gentle range of motion. Do not resume regular use until instructed by your caregiver. Begin use gradually. Do not increase use to the point of pain. If pain does develop, decrease use and continue the above measures. Gradually increase activities that do not cause discomfort. Do this until you finally achieve normal use.   Perform low impact activities while pain is present. Wear proper footwear.   Only take over-the-counter or prescription medicines for pain, discomfort, or fever as directed by your caregiver.  SEEK MEDICAL CARE IF:   Your pain increases or pain is not controlled with medications.   You develop new, unexplained symptoms (problems), or an increase of the symptoms that brought you to your caregiver.   Your pain and symptoms are not improving or are getting worse.  Document Released: 04/30/2002 Document Revised: 10/28/2011 Document Reviewed: 11/08/2005 [facility] Patient Information 2012 [place], [place].

## 2012-03-18 NOTE — ED Notes (Signed)
Pt refused to have vitals updated. States there is nothing wrong with his vitals, and he needs to see a doctor.

## 2012-04-18 ENCOUNTER — Emergency Department (HOSPITAL_BASED_OUTPATIENT_CLINIC_OR_DEPARTMENT_OTHER)
Admission: EM | Admit: 2012-04-18 | Discharge: 2012-04-19 | Disposition: A | Discharge status: Home / Self Care | Payer: Self-pay | Attending: Emergency Medicine | Admitting: Emergency Medicine

## 2012-04-18 ENCOUNTER — Encounter (HOSPITAL_BASED_OUTPATIENT_CLINIC_OR_DEPARTMENT_OTHER): Payer: Self-pay | Admitting: *Deleted

## 2012-04-18 DIAGNOSIS — L089 Local infection of the skin and subcutaneous tissue, unspecified: Secondary | ICD-10-CM | POA: Insufficient documentation

## 2012-04-18 NOTE — ED Notes (Signed)
Abscess on the back of his neck for about a week. Painful, red, and hot to touch.

## 2012-04-19 MED ORDER — SULFAMETHOXAZOLE-TRIMETHOPRIM 800-160 MG PO TABS: 1.0000 | ORAL_TABLET | Freq: Two times a day (BID) | ORAL | Status: AC

## 2012-04-19 MED ORDER — CEPHALEXIN 250 MG PO CAPS
500.0000 mg | ORAL_CAPSULE | Freq: Once | ORAL | Status: AC
  Administered 2012-04-19: 500 mg via ORAL
  Filled 2012-04-19: qty 2

## 2012-04-19 MED ORDER — OXYCODONE-ACETAMINOPHEN 5-325 MG PO TABS
2.0000 | ORAL_TABLET | Freq: Once | ORAL | Status: AC
  Administered 2012-04-19: 2 via ORAL
  Filled 2012-04-19: qty 2

## 2012-04-19 MED ORDER — SULFAMETHOXAZOLE-TMP DS 800-160 MG PO TABS
1.0000 | ORAL_TABLET | Freq: Once | ORAL | Status: AC
  Administered 2012-04-19: 1 via ORAL
  Filled 2012-04-19: qty 1

## 2012-04-19 MED ORDER — OXYCODONE-ACETAMINOPHEN 5-325 MG PO TABS: 2.0000 | ORAL_TABLET | Freq: Four times a day (QID) | ORAL | Status: AC | PRN

## 2012-04-19 MED ORDER — CEPHALEXIN 500 MG PO CAPS: 500.0000 mg | ORAL_CAPSULE | Freq: Four times a day (QID) | ORAL | Status: AC

## 2012-04-19 NOTE — Discharge Instructions (Signed)
Skin Infections  Hold a warm clean washcloth over the painful area of your neck 4 times daily, 30 minutes at a time. Take Advil or Tylenol for mild pain, or the pain medicine prescribed for bad pain. Return in 2 days if you feel that you are not improving, or return sooner if you feel worse for any reason A skin infection usually develops as a result of disruption of the skin barrier.  CAUSES  A skin infection might occur following:  Trauma or an injury to the skin such as a cut or insect sting.   Inflammation (as in eczema).   Breaks in the skin between the toes (as in athlete's foot).   Swelling (edema).  SYMPTOMS  The legs are the most common site affected. Usually there is:  Redness.   Swelling.   Pain.   There may be red streaks in the area of the infection.  TREATMENT   Minor skin infections may be treated with topical antibiotics, but if the skin infection is severe, hospital care and intravenous (IV) antibiotic treatment may be needed.   Most often skin infections can be treated with oral antibiotic medicine as well as proper rest and elevation of the affected area until the infection improves.   If you are prescribed oral antibiotics, it is important to take them as directed and to take all the pills even if you feel better before you have finished all of the medicine.   You may apply warm compresses to the area for 20-30 minutes 4 times daily.  You might need a tetanus shot now if:  You have no idea when you had the last one.   You have never had a tetanus shot before.   Your wound had dirt in it.  If you need a tetanus shot and you decide not to get one, there is a rare chance of getting tetanus. Sickness from tetanus can be serious. If you get a tetanus shot, your arm may swell and become red and warm at the shot site. This is common and not a problem. SEEK MEDICAL CARE IF:  The pain and swelling from your infection do not improve within 2 days.  SEEK IMMEDIATE  MEDICAL CARE IF:  You develop a fever, chills, or other serious problems.  Document Released: 12/16/2004 Document Revised: 10/28/2011 Document Reviewed: 10/28/2008 [facility] Patient Information 2012 [place], [place].

## 2012-04-19 NOTE — ED Notes (Signed)
Dr. Jacubowitz at bedside 

## 2012-04-19 NOTE — ED Provider Notes (Signed)
History     CSN: [id number]  Arrival date & time 04/18/12  2205   First MD Initiated Contact with Patient 04/19/12 0024      Chief Complaint  Patient presents with  . Abscess    (Consider location/radiation/quality/duration/timing/severity/associated sxs/prior treatment) HPI Complains of painful swollen area to back of neck onset approximately one week ago. Pain is worse with palpation not improved by anything. He attempted to treat the area by squeezing it, no drainage or pus came out. No other associated symptoms no other complaint. Pain is nonradiating moderate in History reviewed. No pertinent past medical history.  Past Surgical History  Procedure Date  . Skin biopsy     Family History  Problem Relation Age of Onset  . Cancer Father     History  Substance Use Topics  . Smoking status: Current Everyday Smoker -- 1.0 packs/day  . Smokeless tobacco: Not on file  . Alcohol Use: No      Review of Systems  Constitutional: Negative.   HENT: Negative.   Respiratory: Negative.   Cardiovascular: Negative.   Gastrointestinal: Negative.   Musculoskeletal: Negative.   Skin: Positive for wound.  Neurological: Negative.   Hematological: Negative.   Psychiatric/Behavioral: Negative.   All other systems reviewed and are negative.    Allergies  Vicodin  Home Medications   Current Outpatient Rx  Name Route Sig Dispense Refill  . ASPIRIN-ACETAMINOPHEN-CAFFEINE 250-250-65 MG PO TABS Oral Take 2 tablets by mouth every 6 (six) hours as needed. For pain    . IBUPROFEN 200 MG PO TABS Oral Take 200 mg by mouth every 6 (six) hours as needed. For pain    . TRIPLE ANTIBIOTIC [phone] EX OINT Topical Apply 1 application topically 3 (three) times daily as needed. For wound on neck      BP 118/64  Pulse 73  Temp(Src) 98.2 F (36.8 C) (Oral)  Resp 20  SpO2 97%  Physical Exam  Nursing note and vitals reviewed. Constitutional: He appears well-developed and  well-nourished.  HENT:  Head: Normocephalic.  Eyes: EOM are normal.  Neck: Normal range of motion. Neck supple.       Is a dime-sized raised lesion with central scab approximately 2 mm in diameter, tender mildly reddened, not fluctuant,  Cardiovascular: Normal rate.   Pulmonary/Chest: Effort normal.  Abdominal: Soft. Bowel sounds are normal.  Musculoskeletal: Normal range of motion. He exhibits no edema.  Lymphadenopathy:    He has no cervical adenopathy.  Skin: Skin is warm.       Skin lesion at posterior neck as noted otherwise without lesion  Psychiatric: He has a normal mood and affect.    ED Course  Procedures (including critical care time)  Labs Reviewed - No data to display No results found.   No diagnosis found.    MDM  No obvious fluctuance Plan prescription Percocet, Keflex, Bactrim, warm soaks, recheck in 2 days Diagnosis soft tissue infection of neck        Doug Sou, MD 04/19/12 0221

## 2012-10-09 ENCOUNTER — Encounter (HOSPITAL_COMMUNITY): Payer: Self-pay | Admitting: Emergency Medicine

## 2012-10-09 ENCOUNTER — Emergency Department (HOSPITAL_COMMUNITY)
Admission: EM | Admit: 2012-10-09 | Discharge: 2012-10-09 | Disposition: A | Discharge status: Home / Self Care | Payer: Self-pay | Attending: Emergency Medicine | Admitting: Emergency Medicine

## 2012-10-09 DIAGNOSIS — L03211 Cellulitis of face: Secondary | ICD-10-CM | POA: Insufficient documentation

## 2012-10-09 DIAGNOSIS — L0201 Cutaneous abscess of face: Secondary | ICD-10-CM | POA: Insufficient documentation

## 2012-10-09 DIAGNOSIS — F17200 Nicotine dependence, unspecified, uncomplicated: Secondary | ICD-10-CM | POA: Insufficient documentation

## 2012-10-09 MED ORDER — OXYCODONE-ACETAMINOPHEN 5-325 MG PO TABS
2.0000 | ORAL_TABLET | Freq: Once | ORAL | Status: AC
  Administered 2012-10-09: 2 via ORAL
  Filled 2012-10-09: qty 2

## 2012-10-09 MED ORDER — LIDOCAINE HCL (PF) 1 % IJ SOLN
INTRAMUSCULAR | Status: AC
  Administered 2012-10-09: 04:00:00
  Filled 2012-10-09: qty 5

## 2012-10-09 MED ORDER — DOXYCYCLINE HYCLATE 100 MG PO CAPS: 100.0000 mg | ORAL_CAPSULE | Freq: Two times a day (BID) | ORAL | Status: DC

## 2012-10-09 MED ORDER — BACITRACIN 500 UNIT/GM EX OINT
1.0000 "application " | TOPICAL_OINTMENT | Freq: Two times a day (BID) | CUTANEOUS | Status: DC
  Administered 2012-10-09: 1 via TOPICAL
  Filled 2012-10-09 (×4): qty 0.9

## 2012-10-09 MED ORDER — IBUPROFEN 800 MG PO TABS
800.0000 mg | ORAL_TABLET | Freq: Once | ORAL | Status: AC
  Administered 2012-10-09: 800 mg via ORAL
  Filled 2012-10-09: qty 1

## 2012-10-09 MED ORDER — IBUPROFEN 800 MG PO TABS: 800.0000 mg | ORAL_TABLET | Freq: Three times a day (TID) | ORAL | Status: DC

## 2012-10-09 MED ORDER — DOXYCYCLINE HYCLATE 100 MG PO TABS
100.0000 mg | ORAL_TABLET | Freq: Once | ORAL | Status: AC
  Administered 2012-10-09: 100 mg via ORAL
  Filled 2012-10-09: qty 1

## 2012-10-09 MED ORDER — OXYCODONE-ACETAMINOPHEN 5-325 MG PO TABS: 2.0000 | ORAL_TABLET | ORAL | Status: DC | PRN

## 2012-10-09 NOTE — ED Provider Notes (Signed)
History     CSN: [id number]  Arrival date & time 10/09/12  0120   First MD Initiated Contact with Patient 10/09/12 0135      Chief Complaint  Patient presents with  . Abscess    right side of face    (Consider location/radiation/quality/duration/timing/severity/associated sxs/prior treatment) HPI   [name] is a 36 y.o. male who presents to the Emergency Department complaining of a raised red papule to the right side of his face that began 4 days ago and has gotten larger and more painful. He tried to squeeze it but got not drainage. Denies fever, chills.    History reviewed. No pertinent past medical history.  Past Surgical History  Procedure Date  . Skin biopsy     Family History  Problem Relation Age of Onset  . Cancer Father     History  Substance Use Topics  . Smoking status: Current Every Day Smoker -- 1.0 packs/day  . Smokeless tobacco: Not on file  . Alcohol Use: No      Review of Systems  Constitutional: Negative for fever.       10 Systems reviewed and are negative for acute change except as noted in the HPI.  HENT: Negative for congestion.   Eyes: Negative for discharge and redness.  Respiratory: Negative for cough and shortness of breath.   Cardiovascular: Negative for chest pain.  Gastrointestinal: Negative for vomiting and abdominal pain.  Musculoskeletal: Negative for back pain.  Skin: Negative for rash.       Facial abscess  Neurological: Negative for syncope, numbness and headaches.  Psychiatric/Behavioral:       No behavior change.    Allergies  Vicodin  Home Medications   Current Outpatient Rx  Name  Route  Sig  Dispense  Refill  . ASPIRIN-ACETAMINOPHEN-CAFFEINE 250-250-65 MG PO TABS   Oral   Take 2 tablets by mouth every 6 (six) hours as needed. For pain         . DOXYCYCLINE HYCLATE 100 MG PO CAPS   Oral   Take 1 capsule (100 mg total) by mouth 2 (two) times daily.   20 capsule   0   . IBUPROFEN 200 MG PO TABS  Oral   Take 200 mg by mouth every 6 (six) hours as needed. For pain         . IBUPROFEN 800 MG PO TABS   Oral   Take 1 tablet (800 mg total) by mouth 3 (three) times daily.   21 tablet   0   . TRIPLE ANTIBIOTIC [phone] EX OINT   Topical   Apply 1 application topically 3 (three) times daily as needed. For wound on neck         . OXYCODONE-ACETAMINOPHEN 5-325 MG PO TABS   Oral   Take 2 tablets by mouth every 4 (four) hours as needed for pain.   15 tablet   0     BP 135/83  Pulse 92  Temp 98.2 F (36.8 C) (Oral)  Resp 20  Ht 6' (1.829 m)  Wt 195 lb (88.451 kg)  BMI 26.45 kg/m2  SpO2 95%  Physical Exam  Nursing note and vitals reviewed. Constitutional: He appears well-developed and well-nourished.       Awake, alert, nontoxic appearance.  HENT:  Head: Atraumatic.  Right Ear: External ear normal.  Left Ear: External ear normal.  Mouth/Throat: Oropharynx is clear and moist.       2 cm x 1 cm abscess  ti right cheek. Patient is growing a beard.  Eyes: Right eye exhibits no discharge. Left eye exhibits no discharge.  Neck: Neck supple.  Cardiovascular: Normal heart sounds.   Pulmonary/Chest: Effort normal and breath sounds normal. He exhibits no tenderness.  Abdominal: Soft. There is no tenderness. There is no rebound.  Musculoskeletal: He exhibits no tenderness.       Baseline ROM, no obvious new focal weakness.  Neurological:       Mental status and motor strength appears baseline for patient and situation.  Skin: No rash noted.  Psychiatric: He has a normal mood and affect.    ED Course  Procedures (including critical care time)  INCISION AND DRAINAGE Performed by: Annamarie Dawley Consent: Verbal consent obtained. Risks and benefits: risks, benefits and alternatives were discussed Type: absces Body area: right facial cheek Anesthesia: local infiltration Local anesthetic: lidocaine 1%  W/o epinephrine Anesthetic total: 3 ml Complexity: complex Blunt  dissection to break up loculations Drainage: purulent Drainage amount: moderate Patient tolerance: Patient tolerated the procedure well with no immediate complications. Time 30 minutes    1. Facial abscess       MDM  Patient with facial abscess. I&D of abscess. Initiated antibiotic therapy for MRSA. Gi en ibuprofen and analgesic. Pt stable in ED with no significant deterioration in condition.The patient appears reasonably screened and/or stabilized for discharge and I doubt any other medical condition or other [facility] requiring further screening, evaluation, or treatment in the ED at this time prior to discharge.  MDM Reviewed: nursing note and vitals           Nicoletta Dress. Colon Branch, MD 10/09/12 [phone]

## 2012-10-09 NOTE — ED Notes (Signed)
Pt states noticed swelling to the right side of the face 4 days ago.  Has continued to swell and become red and painful.

## 2012-11-22 ENCOUNTER — Encounter (HOSPITAL_BASED_OUTPATIENT_CLINIC_OR_DEPARTMENT_OTHER): Payer: Self-pay | Admitting: *Deleted

## 2012-11-22 ENCOUNTER — Emergency Department (HOSPITAL_BASED_OUTPATIENT_CLINIC_OR_DEPARTMENT_OTHER)
Admission: EM | Admit: 2012-11-22 | Discharge: 2012-11-22 | Disposition: A | Discharge status: Home / Self Care | Payer: Self-pay | Attending: Emergency Medicine | Admitting: Emergency Medicine

## 2012-11-22 ENCOUNTER — Emergency Department (HOSPITAL_BASED_OUTPATIENT_CLINIC_OR_DEPARTMENT_OTHER): Payer: Self-pay

## 2012-11-22 DIAGNOSIS — Y9389 Activity, other specified: Secondary | ICD-10-CM | POA: Insufficient documentation

## 2012-11-22 DIAGNOSIS — Z791 Long term (current) use of non-steroidal anti-inflammatories (NSAID): Secondary | ICD-10-CM | POA: Insufficient documentation

## 2012-11-22 DIAGNOSIS — Z79899 Other long term (current) drug therapy: Secondary | ICD-10-CM | POA: Insufficient documentation

## 2012-11-22 DIAGNOSIS — R296 Repeated falls: Secondary | ICD-10-CM | POA: Insufficient documentation

## 2012-11-22 DIAGNOSIS — S39012A Strain of muscle, fascia and tendon of lower back, initial encounter: Secondary | ICD-10-CM

## 2012-11-22 DIAGNOSIS — S335XXA Sprain of ligaments of lumbar spine, initial encounter: Secondary | ICD-10-CM | POA: Insufficient documentation

## 2012-11-22 DIAGNOSIS — Z7982 Long term (current) use of aspirin: Secondary | ICD-10-CM | POA: Insufficient documentation

## 2012-11-22 DIAGNOSIS — Y929 Unspecified place or not applicable: Secondary | ICD-10-CM | POA: Insufficient documentation

## 2012-11-22 DIAGNOSIS — F17200 Nicotine dependence, unspecified, uncomplicated: Secondary | ICD-10-CM | POA: Insufficient documentation

## 2012-11-22 HISTORY — DX: Personal history of other (healed) physical injury and trauma: Z87.828

## 2012-11-22 MED ORDER — CYCLOBENZAPRINE HCL 10 MG PO TABS: 10.0000 mg | ORAL_TABLET | Freq: Two times a day (BID) | ORAL | Status: DC | PRN

## 2012-11-22 MED ORDER — HYDROMORPHONE HCL PF 2 MG/ML IJ SOLN
2.0000 mg | Freq: Once | INTRAMUSCULAR | Status: AC
  Administered 2012-11-22: 2 mg via INTRAMUSCULAR
  Filled 2012-11-22: qty 1

## 2012-11-22 MED ORDER — OXYCODONE-ACETAMINOPHEN 5-325 MG PO TABS: 2.0000 | ORAL_TABLET | ORAL | Status: AC | PRN

## 2012-11-22 MED ORDER — ONDANSETRON HCL 4 MG/2ML IJ SOLN
4.0000 mg | Freq: Once | INTRAMUSCULAR | Status: AC
  Administered 2012-11-22: 4 mg via INTRAMUSCULAR
  Filled 2012-11-22: qty 2

## 2012-11-22 NOTE — ED Provider Notes (Signed)
Medical screening examination/treatment/procedure(s) were performed by non-physician practitioner and as supervising physician I was immediately available for consultation/collaboration.   Gavin Pound. Oletta Lamas, MD 11/22/12 2339

## 2012-11-22 NOTE — ED Notes (Signed)
Pt states he was coming down out of a tree stand with a cable system and it snapped and he injured his right lower back. Pain radiates down right leg. Ambulatory to ED.

## 2012-11-22 NOTE — ED Provider Notes (Signed)
History     CSN: [id number]  Arrival date & time 11/22/12  1555   First MD Initiated Contact with Patient 11/22/12 1829      Chief Complaint  Patient presents with  . Back Injury    (Consider location/radiation/quality/duration/timing/severity/associated sxs/prior treatment) Patient is a 37 y.o. male presenting with back pain. The history is provided by the patient. No language interpreter was used.  Back Pain  This is a new problem. The current episode started 3 to 5 hours ago. The problem occurs constantly. The problem has been gradually worsening. The pain is associated with falling. The pain is present in the lumbar spine. The quality of the pain is described as stabbing, shooting and aching. The pain does not radiate. The pain is at a severity of 8/10. The pain is moderate. The pain is the same all the time. Stiffness is present all day. Pertinent negatives include no abdominal pain, no paresthesias, no paresis and no weakness. He has tried nothing for the symptoms.  Pt complains of pain in his low back.  Pt reports he fell about 6 foot down a tree while using a tree climber.   Pt reports bucket hit the ground and he twisted to the right   Past Medical History  Diagnosis Date  . H/O back injury     Past Surgical History  Procedure Date  . Skin biopsy     Family History  Problem Relation Age of Onset  . Cancer Father     History  Substance Use Topics  . Smoking status: Current Every Day Smoker -- 0.5 packs/day    Types: Cigarettes  . Smokeless tobacco: Never Used  . Alcohol Use: No      Review of Systems  Gastrointestinal: Negative for abdominal pain.  Musculoskeletal: Positive for back pain.  Neurological: Negative for weakness and paresthesias.  All other systems reviewed and are negative.    Allergies  Vicodin  Home Medications   Current Outpatient Rx  Name  Route  Sig  Dispense  Refill  . IBUPROFEN 200 MG PO TABS   Oral   Take 200 mg by mouth every  6 (six) hours as needed. For pain         . TRIPLE ANTIBIOTIC [phone] EX OINT   Topical   Apply 1 application topically 3 (three) times daily as needed. For wound on neck         . ASPIRIN-ACETAMINOPHEN-CAFFEINE 250-250-65 MG PO TABS   Oral   Take 2 tablets by mouth every 6 (six) hours as needed. For pain         . DOXYCYCLINE HYCLATE 100 MG PO CAPS   Oral   Take 1 capsule (100 mg total) by mouth 2 (two) times daily.   20 capsule   0   . IBUPROFEN 800 MG PO TABS   Oral   Take 1 tablet (800 mg total) by mouth 3 (three) times daily.   21 tablet   0   . OXYCODONE-ACETAMINOPHEN 5-325 MG PO TABS   Oral   Take 2 tablets by mouth every 4 (four) hours as needed for pain.   15 tablet   0     BP 130/82  Pulse 72  Temp 98.4 F (36.9 C) (Oral)  Resp 18  Ht 6' (1.829 m)  Wt 210 lb (95.255 kg)  BMI 28.48 kg/m2  SpO2 100%  Physical Exam  Nursing note and vitals reviewed. Constitutional: He is oriented to person, place, and time.  He appears well-developed and well-nourished.  HENT:  Head: Normocephalic and atraumatic.  Eyes: Conjunctivae normal and EOM are normal. Pupils are equal, round, and reactive to light.  Neck: Normal range of motion.  Cardiovascular: Normal rate.   Pulmonary/Chest: Effort normal.  Abdominal: Soft.  Musculoskeletal: He exhibits tenderness.       Tender low back l4-l5 sacral area,  Decreased range of motion,  nv and ns intact  Neurological: He is alert and oriented to person, place, and time. He has normal reflexes.  Skin: Skin is warm.    ED Course  Procedures (including critical care time)  Labs Reviewed - No data to display Dg Lumbar Spine Complete  11/22/2012  *RADIOLOGY REPORT*  Clinical Data: Back pain after falling out of tree scan today  LUMBAR SPINE - COMPLETE 4+ VIEW  Comparison: None.  Findings: AP, lateral and bilateral oblique views of the lumbosacral spine demonstrate no acute fracture, or malalignment. Vertebral body heights  and intervertebral disc spaces are maintained.  There is mild increased sclerosis of the facets at L5- S1 likely representing mild degenerative arthropathy.  The visualized bowel gas pattern is unremarkable.  On the lateral view, there is loss of the normal lumbar lordosis which can be seen with muscle spasm.  IMPRESSION:  1.  No acute fracture or malalignment. 2.  Mild straightening of the normal lumbar lordosis which can be seen with muscle spasm 3.  Probable mild L5-S1 facet arthropathy   Original Report Authenticated By: Malachy Moan, M.D.      No diagnosis found.    MDM  Percocet, flexeril.    Pt advised to schedule to see Dr. Pearletha Forge for recheck.  Return if any problems.        Lonia Skinner Brogan, [place] 11/22/12 1940

## 2012-11-22 NOTE — ED Notes (Signed)
Clydie Braun EDPA at bedside.

## 2013-01-02 ENCOUNTER — Emergency Department (HOSPITAL_COMMUNITY): Payer: Self-pay

## 2013-01-02 ENCOUNTER — Encounter (HOSPITAL_COMMUNITY): Payer: Self-pay | Admitting: Emergency Medicine

## 2013-01-02 ENCOUNTER — Emergency Department (HOSPITAL_COMMUNITY)
Admission: EM | Admit: 2013-01-02 | Discharge: 2013-01-03 | Disposition: A | Discharge status: Home / Self Care | Payer: Self-pay | Attending: Emergency Medicine | Admitting: Emergency Medicine

## 2013-01-02 DIAGNOSIS — M25569 Pain in unspecified knee: Secondary | ICD-10-CM

## 2013-01-02 DIAGNOSIS — W010XXA Fall on same level from slipping, tripping and stumbling without subsequent striking against object, initial encounter: Secondary | ICD-10-CM | POA: Insufficient documentation

## 2013-01-02 DIAGNOSIS — F17200 Nicotine dependence, unspecified, uncomplicated: Secondary | ICD-10-CM | POA: Insufficient documentation

## 2013-01-02 DIAGNOSIS — Z87828 Personal history of other (healed) physical injury and trauma: Secondary | ICD-10-CM | POA: Insufficient documentation

## 2013-01-02 DIAGNOSIS — S8990XA Unspecified injury of unspecified lower leg, initial encounter: Secondary | ICD-10-CM | POA: Insufficient documentation

## 2013-01-02 DIAGNOSIS — Z791 Long term (current) use of non-steroidal anti-inflammatories (NSAID): Secondary | ICD-10-CM | POA: Insufficient documentation

## 2013-01-02 DIAGNOSIS — Y9289 Other specified places as the place of occurrence of the external cause: Secondary | ICD-10-CM | POA: Insufficient documentation

## 2013-01-02 DIAGNOSIS — M549 Dorsalgia, unspecified: Secondary | ICD-10-CM | POA: Insufficient documentation

## 2013-01-02 DIAGNOSIS — S99919A Unspecified injury of unspecified ankle, initial encounter: Secondary | ICD-10-CM | POA: Insufficient documentation

## 2013-01-02 DIAGNOSIS — G8929 Other chronic pain: Secondary | ICD-10-CM | POA: Insufficient documentation

## 2013-01-02 DIAGNOSIS — Y9301 Activity, walking, marching and hiking: Secondary | ICD-10-CM | POA: Insufficient documentation

## 2013-01-02 DIAGNOSIS — Y990 Civilian activity done for income or pay: Secondary | ICD-10-CM | POA: Insufficient documentation

## 2013-01-02 NOTE — ED Notes (Signed)
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  He states that he fell about 6 feet today which he thinks may have inflammed the site.  No swelling noted.  There is a small abrasion on his R knee cap.

## 2013-01-02 NOTE — ED Notes (Signed)
PT. LOST BALANCED AND FELL THIS EVENING REPORTS PAIN AT RIGHT KNEE, STATES HISTORY AT OF RIGHT KNEE CYST .

## 2013-01-02 NOTE — ED Provider Notes (Signed)
History  This chart was scribed for [name], nurse practitioner working with Sunnie Nielsen, MD by Bennett Scrape, ED Scribe. This patient was seen in room TR10C/TR10C and the patient's care was started at 11:51 PM.  CSN: [id number]  Arrival date & time 01/02/13  2155   First MD Initiated Contact with Patient 01/02/13 2351      Chief Complaint  Patient presents with  . Knee Pain     Patient is a 37 y.o. male presenting with knee pain. The history is provided by the patient. No language interpreter was used.  Knee Pain Location:  Knee Injury: yes   Mechanism of injury: fall   Fall:    Fall occurred:  Walking   Impact surface:  [employer] of impact:  Knees   Entrapped after fall: no   Knee location:  R knee Pain details:    Quality:  Throbbing   Radiates to:  Does not radiate   Severity:  Moderate   Onset quality:  Sudden   Timing:  Constant   Progression:  Unchanged Chronicity:  Chronic Dislocation: no   Foreign body present:  No foreign bodies Prior injury to area:  Yes Worsened by:  Bearing weight and extension Ineffective treatments:  None tried Associated symptoms: no back pain     [name] is a 37 y.o. male who presents to the Emergency Department complaining of sudden onset, non-changing, constant right knee pain described as throbbing after a fall this evening at work. Pt states that he was walking, lost his balance and fell onto his right knee on gravel. He reports that he was seen in the ED for similar pain and diagnosed with a baker cyst through MRI. He states that he tried to follow up with Dr. Hilda Lias but states that he could not afford the visit. He reports that he took pain medication and antiinflammatories with resolvement in the pain until it returned after he fell onto the right knee this evening. He denies any other symptoms, head trauma and LOC as associated symptoms. He has a h/o chronic back pain and is a current everyday smoker but denies  alcohol use.  Past Medical History  Diagnosis Date  . H/O back injury     Past Surgical History  Procedure Laterality Date  . Skin biopsy      Family History  Problem Relation Age of Onset  . Cancer Father     History  Substance Use Topics  . Smoking status: Current Every Day Smoker -- 0.50 packs/day    Types: Cigarettes  . Smokeless tobacco: Never Used  . Alcohol Use: No      Review of Systems  Cardiovascular: Negative for chest pain.  Gastrointestinal: Negative for abdominal pain.  Musculoskeletal: Negative for back pain.       Positive for right knee pain  All other systems reviewed and are negative.    Allergies  Vicodin  Home Medications   Current Outpatient Rx  Name  Route  Sig  Dispense  Refill  . aspirin-acetaminophen-caffeine (EXCEDRIN MIGRAINE) 250-250-65 MG per tablet   Oral   Take 2 tablets by mouth every 6 (six) hours as needed. For pain         . ibuprofen (ADVIL,MOTRIN) 800 MG tablet   Oral   Take 1 tablet (800 mg total) by mouth 3 (three) times daily.   21 tablet   0     Triage Vitals: BP 119/83  Pulse 92  Temp(Src)  97.9 F (36.6 C) (Oral)  Resp 16  SpO2 99%  Physical Exam  Nursing note and vitals reviewed. Constitutional: He is oriented to person, place, and time. He appears well-developed and well-nourished. No distress.  HENT:  Head: Normocephalic and atraumatic.  Eyes: Conjunctivae and EOM are normal.  Neck: Neck supple. No tracheal deviation present.  Cardiovascular: Normal rate, regular rhythm and intact distal pulses.   Pulmonary/Chest: Effort normal and breath sounds normal. No respiratory distress.  Abdominal: Soft. Bowel sounds are normal.  Musculoskeletal: Normal range of motion.  Pain with extension of the right knee, no edema or ecchymosis noted, no ligament instability   Neurological: He is alert and oriented to person, place, and time. He has normal strength. No sensory deficit. Cranial nerve deficit:   He  displays no seizure activity.  Skin: Skin is warm and dry.  Psychiatric: He has a normal mood and affect.    ED Course  Procedures (including critical care time)  DIAGNOSTIC STUDIES: Oxygen Saturation is 99% on room air, normal by my interpretation.    COORDINATION OF CARE: 11:53 PM- Advised pt of radiology results. Discussed discharge plan which includes antiinflammatories and ice with pt and pt agreed to plan. Also advised pt to follow up with orthopedist and pt agreed.   Labs Reviewed - No data to display Dg Knee Complete 4 Views Right  01/02/2013  *RADIOLOGY REPORT*  Clinical Data: Patient fell 6 feet onto a bed of rocks.  RIGHT KNEE - COMPLETE 4+ VIEW  Comparison: None.  Findings: No effusion.  No fractures.  Joint spaces normally maintained.  No bone or joint or soft tissue abnormalities.  IMPRESSION: No evidence of acute trauma.   Original Report Authenticated By: Sander Radon, M.D.      No diagnosis found.    MDM      I personally performed the services described in this documentation, which was scribed in my presence. The recorded information has been reviewed and is accurate.     Jimmye Norman, NP 01/03/13 [phone]

## 2013-01-03 MED ORDER — OXYCODONE-ACETAMINOPHEN 5-325 MG PO TABS: 1.0000 | ORAL_TABLET | Freq: Four times a day (QID) | ORAL | Status: DC | PRN

## 2013-01-03 MED ORDER — NAPROXEN 500 MG PO TABS: 500.0000 mg | ORAL_TABLET | Freq: Two times a day (BID) | ORAL | Status: DC

## 2013-01-03 NOTE — Progress Notes (Signed)
Orthopedic Tech Progress Note Patient Details:  [name] 1976-09-22 [id number]  Ortho Devices Type of Ortho Device: Knee Immobilizer   [name] 01/03/2013, 12:11 AM

## 2013-01-03 NOTE — ED Provider Notes (Signed)
Medical screening examination/treatment/procedure(s) were performed by non-physician practitioner and as supervising physician I was immediately available for consultation/collaboration.  Vedanth Sirico, MD 01/03/13 0528 

## 2013-01-27 ENCOUNTER — Emergency Department (HOSPITAL_COMMUNITY)
Admission: EM | Admit: 2013-01-27 | Discharge: 2013-01-28 | Disposition: A | Discharge status: Home / Self Care | Payer: Self-pay | Attending: Emergency Medicine | Admitting: Emergency Medicine

## 2013-01-27 ENCOUNTER — Encounter (HOSPITAL_COMMUNITY): Payer: Self-pay

## 2013-01-27 DIAGNOSIS — M25569 Pain in unspecified knee: Secondary | ICD-10-CM | POA: Insufficient documentation

## 2013-01-27 DIAGNOSIS — G8929 Other chronic pain: Secondary | ICD-10-CM | POA: Insufficient documentation

## 2013-01-27 DIAGNOSIS — F17200 Nicotine dependence, unspecified, uncomplicated: Secondary | ICD-10-CM | POA: Insufficient documentation

## 2013-01-27 NOTE — ED Provider Notes (Signed)
History  This chart was scribed for Adrian Nielsen, MD by [name], ED Scribe. This patient was seen in room APA12/APA12 and the patient's care was started at 23:21.   CSN: [id number]  Arrival date & time 01/27/13  2253   First MD Initiated Contact with Patient 01/27/13 2321      Chief Complaint  Patient presents with  . Knee Pain    (Consider location/radiation/quality/duration/timing/severity/associated sxs/prior treatment) The history is provided by the patient. No language interpreter was used.   [name] is a 37 y.o. male who presents to the Emergency Department complaining of chronic pounding right knee pain, aggravated since yesterday. Pt has been evaluated several times for the same complaint. He had an MRI 6 months ago that showed damage and a cyst but no need for surgery. Pt has been treating the pain with a heating pad. Pt has no h/o heart or lung issues. He smokes cigarettes.  Past Medical History  Diagnosis Date  . H/O back injury     Past Surgical History  Procedure Laterality Date  . Skin biopsy      Family History  Problem Relation Age of Onset  . Cancer Father     History  Substance Use Topics  . Smoking status: Current Every Day Smoker -- 0.50 packs/day    Types: Cigarettes  . Smokeless tobacco: Never Used  . Alcohol Use: No      Review of Systems A complete 10 system review of systems was obtained and all systems are negative except as noted in the HPI and PMH.    Allergies  Vicodin  Home Medications   Current Outpatient Rx  Name  Route  Sig  Dispense  Refill  . aspirin-acetaminophen-caffeine (EXCEDRIN MIGRAINE) 250-250-65 MG per tablet   Oral   Take 2 tablets by mouth every 6 (six) hours as needed. For pain         . ibuprofen (ADVIL,MOTRIN) 800 MG tablet   Oral   Take 1 tablet (800 mg total) by mouth 3 (three) times daily.   21 tablet   0   . naproxen (NAPROSYN) 500 MG tablet   Oral   Take 1 tablet (500 mg total) by mouth  2 (two) times daily with a meal.   20 tablet   0   . oxyCODONE-acetaminophen (PERCOCET/ROXICET) 5-325 MG per tablet   Oral   Take 1 tablet by mouth every 6 (six) hours as needed for pain.   10 tablet   0     BP 127/76  Pulse 78  Temp(Src) 98.1 F (36.7 C) (Oral)  Resp 18  Ht 6' (1.829 m)  Wt 210 lb (95.255 kg)  BMI 28.47 kg/m2  SpO2 95%  Physical Exam  Nursing note and vitals reviewed. Constitutional: He is oriented to person, place, and time. He appears well-developed and well-nourished. No distress.  HENT:  Head: Normocephalic and atraumatic.  Eyes: EOM are normal. Pupils are equal, round, and reactive to light.  Neck: Neck supple. No tracheal deviation present.  Cardiovascular: Normal rate, regular rhythm and normal heart sounds.   Pulmonary/Chest: Effort normal and breath sounds normal. No respiratory distress. He has no wheezes.  Abdominal: Soft. He exhibits no distension.  Musculoskeletal: Normal range of motion. He exhibits no edema.  Right knee: tenderness over patella, mild effusion, erythema, no increased wartm to touch. Decreased ROM secondary to pain. No calf tenderness. Distally neurovascular intact.  Neurological: He is alert and oriented to person, place, and time.  Skin: Skin is warm and dry.  Psychiatric: He has a normal mood and affect.    ED Course  Injection of joint Date/Time: 01/28/2013 3:40 AM Performed by: [name] Authorized by: [name] Consent: Verbal consent obtained. Risks and benefits: risks, benefits and alternatives were discussed Consent given by: patient Patient understanding: patient states understanding of the procedure being performed Patient consent: the patient's understanding of the procedure matches consent given Procedure consent: procedure consent matches procedure scheduled Required items: required blood products, implants, devices, and special equipment available Patient identity confirmed: verbally with  patient Time out: Immediately prior to procedure a "time out" was called to verify the correct patient, procedure, equipment, support staff and site/side marked as required. Preparation: Patient was prepped and draped in the usual sterile fashion. Local anesthesia used: yes Anesthesia: local infiltration Local anesthetic: lidocaine 1% without epinephrine Anesthetic total: 2 ml Patient tolerance: Patient tolerated the procedure well with no immediate complications. Comments: R knee sterile prep betadine, local injection lido and superiorlateral approach injection of 40mg  Depo-medrol IA. No bleeding or complications.     (including critical care time) DIAGNOSTIC STUDIES: Oxygen Saturation is 95% on room air, adequate by my interpretation.    COORDINATION OF CARE: 23:54--I evaluated the patient and we discussed a treatment plan including knee x-ray and steroid injection to which the pt agreed.    Dg Knee Complete 4 Views Right  01/28/2013  *RADIOLOGY REPORT*  Clinical Data: Right knee pain.  RIGHT KNEE - COMPLETE 4+ VIEW  Comparison: Right knee radiographs performed 01/02/2013  Findings: There is no evidence of fracture or dislocation.  The joint spaces are preserved.  No significant degenerative change is seen; the patellofemoral joint is grossly unremarkable in appearance.  A small knee joint effusion is seen.  The visualized soft tissues are otherwise unremarkable in appearance.  IMPRESSION:  1.  No evidence of fracture or dislocation. 2.  Small knee joint effusion noted.   Original Report Authenticated By: [name], M.D.     Plan RX and outpatient referral. PT has knee sleeve/ brace with him. Will continue ice and alieve   MDM  R knee recurrent pain no trauma  Xray  Intra-articular steroid injection  VS and nursing notes and old records reviewed    I personally performed the services described in this documentation, which was scribed in my presence. The recorded information  has been reviewed and is accurate.       Adrian Nielsen, MD 01/28/13 [phone]

## 2013-01-27 NOTE — ED Notes (Signed)
Right knee pain, had a cyst in there and it will go away, but then it will come back per pt.

## 2013-01-28 ENCOUNTER — Emergency Department (HOSPITAL_COMMUNITY): Payer: Self-pay

## 2013-01-28 MED ORDER — NAPROXEN 500 MG PO TABS: 500.0000 mg | ORAL_TABLET | Freq: Two times a day (BID) | ORAL | Status: DC

## 2013-01-28 MED ORDER — OXYCODONE-ACETAMINOPHEN 5-325 MG PO TABS: 1.0000 | ORAL_TABLET | Freq: Four times a day (QID) | ORAL | Status: DC | PRN

## 2013-01-28 MED ORDER — LIDOCAINE HCL (PF) 1 % IJ SOLN
INTRAMUSCULAR | Status: AC
  Administered 2013-01-28: 02:00:00
  Filled 2013-01-28: qty 5

## 2013-01-28 MED ORDER — METHYLPREDNISOLONE ACETATE 40 MG/ML IJ SUSP
40.0000 mg | Freq: Once | INTRAMUSCULAR | Status: AC
  Administered 2013-01-28: 40 mg via INTRA_ARTICULAR
  Filled 2013-01-28: qty 5

## 2013-01-28 NOTE — ED Notes (Signed)
Items requested by MD at bedside.

## 2013-02-05 ENCOUNTER — Emergency Department (HOSPITAL_BASED_OUTPATIENT_CLINIC_OR_DEPARTMENT_OTHER)
Admission: EM | Admit: 2013-02-05 | Discharge: 2013-02-05 | Disposition: A | Discharge status: Home / Self Care | Payer: Self-pay | Attending: Emergency Medicine | Admitting: Emergency Medicine

## 2013-02-05 ENCOUNTER — Encounter (HOSPITAL_BASED_OUTPATIENT_CLINIC_OR_DEPARTMENT_OTHER): Payer: Self-pay | Admitting: *Deleted

## 2013-02-05 ENCOUNTER — Emergency Department (HOSPITAL_COMMUNITY)
Admission: EM | Admit: 2013-02-05 | Discharge: 2013-02-05 | Discharge status: Against Medical Advice | Payer: Self-pay | Attending: Emergency Medicine | Admitting: Emergency Medicine

## 2013-02-05 DIAGNOSIS — K089 Disorder of teeth and supporting structures, unspecified: Secondary | ICD-10-CM | POA: Insufficient documentation

## 2013-02-05 DIAGNOSIS — Z87828 Personal history of other (healed) physical injury and trauma: Secondary | ICD-10-CM | POA: Insufficient documentation

## 2013-02-05 DIAGNOSIS — F17200 Nicotine dependence, unspecified, uncomplicated: Secondary | ICD-10-CM | POA: Insufficient documentation

## 2013-02-05 DIAGNOSIS — K029 Dental caries, unspecified: Secondary | ICD-10-CM | POA: Insufficient documentation

## 2013-02-05 MED ORDER — OXYCODONE-ACETAMINOPHEN 5-325 MG PO TABS: 1.0000 | ORAL_TABLET | Freq: Four times a day (QID) | ORAL | Status: DC | PRN

## 2013-02-05 MED ORDER — PENICILLIN V POTASSIUM 250 MG PO TABS: 250.0000 mg | ORAL_TABLET | Freq: Four times a day (QID) | ORAL | Status: AC

## 2013-02-05 MED ORDER — BUPIVACAINE-EPINEPHRINE (PF) 0.5% -1:200000 IJ SOLN
INTRAMUSCULAR | Status: AC
  Administered 2013-02-05: 9 mg
  Filled 2013-02-05: qty 1.8

## 2013-02-05 NOTE — ED Notes (Signed)
Needs pain medication for toothache x the past year.

## 2013-02-05 NOTE — ED Notes (Signed)
Tooth pain for 1 yr

## 2013-02-05 NOTE — ED Provider Notes (Cosign Needed)
Patient left AMA before provider could evaluate.  Arthor Captain, PA-C 02/05/13 1537

## 2013-02-05 NOTE — ED Provider Notes (Signed)
History     CSN: [id number]  Arrival date & time 02/05/13  1319   First MD Initiated Contact with Patient 02/05/13 1349      Chief Complaint  Patient presents with  . Dental Pain    (Consider location/radiation/quality/duration/timing/severity/associated sxs/prior treatment) Patient is a 37 y.o. male presenting with tooth pain. The history is provided by the patient.  Dental PainThe primary symptoms include mouth pain. Primary symptoms do not include headaches, fever, sore throat or angioedema. The symptoms began 2 days ago. The symptoms are worsening. The symptoms are new. The symptoms occur constantly.  Additional symptoms include: dental sensitivity to temperature, gum tenderness and jaw pain. Additional symptoms do not include: gum swelling, trismus, facial swelling, trouble swallowing, pain with swallowing, drooling and ear pain. Medical issues include: smoking and periodontal disease.    Past Medical History  Diagnosis Date  . H/O back injury     Past Surgical History  Procedure Laterality Date  . Skin biopsy      Family History  Problem Relation Age of Onset  . Cancer Father     History  Substance Use Topics  . Smoking status: Current Every Day Smoker -- 0.50 packs/day    Types: Cigarettes  . Smokeless tobacco: Never Used  . Alcohol Use: No      Review of Systems  Constitutional: Negative for fever.  HENT: Negative for ear pain, sore throat, facial swelling, drooling and trouble swallowing.   Neurological: Negative for headaches.    Allergies  Vicodin  Home Medications   Current Outpatient Rx  Name  Route  Sig  Dispense  Refill  . aspirin-acetaminophen-caffeine (EXCEDRIN MIGRAINE) 250-250-65 MG per tablet   Oral   Take 2 tablets by mouth every 6 (six) hours as needed. For pain         . ibuprofen (ADVIL,MOTRIN) 800 MG tablet   Oral   Take 1 tablet (800 mg total) by mouth 3 (three) times daily.   21 tablet   0   . naproxen (NAPROSYN) 500 MG  tablet   Oral   Take 1 tablet (500 mg total) by mouth 2 (two) times daily with a meal.   20 tablet   0   . naproxen (NAPROSYN) 500 MG tablet   Oral   Take 1 tablet (500 mg total) by mouth 2 (two) times daily.   30 tablet   0   . oxyCODONE-acetaminophen (PERCOCET/ROXICET) 5-325 MG per tablet   Oral   Take 1 tablet by mouth every 6 (six) hours as needed for pain.   10 tablet   0   . oxyCODONE-acetaminophen (PERCOCET/ROXICET) 5-325 MG per tablet   Oral   Take 1 tablet by mouth every 6 (six) hours as needed for pain.   10 tablet   0     BP 122/86  Pulse 86  Temp(Src) 98 F (36.7 C) (Oral)  Resp 20  Wt 210 lb (95.255 kg)  BMI 28.47 kg/m2  SpO2 97%  Physical Exam  Nursing note and vitals reviewed. Constitutional: He is oriented to person, place, and time. He appears well-developed and well-nourished. He appears distressed.  HENT:  Head: Normocephalic and atraumatic. No trismus in the jaw.  Mouth/Throat: No oral lesions. Dental caries present. No edematous.    Eyes: EOM are normal. Pupils are equal, round, and reactive to light.  Cardiovascular: Normal rate.   Pulmonary/Chest: Effort normal.  Lymphadenopathy:    He has no cervical adenopathy.  Neurological: He is alert and  oriented to person, place, and time.  Skin: Skin is warm and dry. No rash noted. No erythema.  Psychiatric: He has a normal mood and affect.    ED Course  Dental Date/Time: 02/05/2013 2:11 PM Performed by: Gwyneth Sprout Authorized by: Gwyneth Sprout Consent: Verbal consent obtained. Risks and benefits: risks, benefits and alternatives were discussed Consent given by: patient Local anesthesia used: yes Anesthesia: local infiltration Local anesthetic: bupivacaine 0.5% without epinephrine Anesthetic total: 2 ml Patient sedated: no Patient tolerance: Patient tolerated the procedure well with no immediate complications. Comments: Improvement in pain after procedure   (including  critical care time)  Labs Reviewed - No data to display No results found.   1. Dental caries       MDM    Pt with dental caries and no facial swelling.  No signs of ludwig's angina or difficulty swallowing and no systemic symptoms. Will treat with PCN and have pt f/u with dentist.         Gwyneth Sprout, MD 02/05/13 [phone]

## 2013-02-06 MED FILL — Bupivacaine Inj 0.5% w/ Epinephrine 1:200000 (PF): INTRAMUSCULAR | Qty: 1.8 | Status: AC

## 2013-03-01 ENCOUNTER — Encounter (HOSPITAL_COMMUNITY): Payer: Self-pay

## 2013-03-01 ENCOUNTER — Emergency Department (HOSPITAL_COMMUNITY)
Admission: EM | Admit: 2013-03-01 | Discharge: 2013-03-01 | Disposition: A | Discharge status: Home / Self Care | Payer: Self-pay | Attending: Emergency Medicine | Admitting: Emergency Medicine

## 2013-03-01 DIAGNOSIS — K0889 Other specified disorders of teeth and supporting structures: Secondary | ICD-10-CM

## 2013-03-01 DIAGNOSIS — Z87828 Personal history of other (healed) physical injury and trauma: Secondary | ICD-10-CM | POA: Insufficient documentation

## 2013-03-01 DIAGNOSIS — K089 Disorder of teeth and supporting structures, unspecified: Secondary | ICD-10-CM | POA: Insufficient documentation

## 2013-03-01 DIAGNOSIS — G8929 Other chronic pain: Secondary | ICD-10-CM | POA: Insufficient documentation

## 2013-03-01 DIAGNOSIS — F17200 Nicotine dependence, unspecified, uncomplicated: Secondary | ICD-10-CM | POA: Insufficient documentation

## 2013-03-01 DIAGNOSIS — K1370 Unspecified lesions of oral mucosa: Secondary | ICD-10-CM | POA: Insufficient documentation

## 2013-03-01 MED ORDER — OXYCODONE-ACETAMINOPHEN 5-325 MG PO TABS
1.0000 | ORAL_TABLET | Freq: Once | ORAL | Status: AC
  Administered 2013-03-01: 1 via ORAL
  Filled 2013-03-01: qty 1

## 2013-03-01 MED ORDER — TRAMADOL HCL 50 MG PO TABS: 50.0000 mg | ORAL_TABLET | Freq: Four times a day (QID) | ORAL | Status: DC | PRN

## 2013-03-01 NOTE — ED Provider Notes (Signed)
History     CSN: [id number]  Arrival date & time 03/01/13  1301   First MD Initiated Contact with Patient 03/01/13 1311      Chief Complaint  Patient presents with  . Dental Pain    (Consider location/radiation/quality/duration/timing/severity/associated sxs/prior treatment) Patient is a 37 y.o. male presenting with tooth pain. The history is provided by the patient.  Dental PainThe primary symptoms include mouth pain. Primary symptoms do not include dental injury, oral bleeding, oral lesions, headaches, fever, shortness of breath, sore throat, angioedema or cough. The symptoms began more than 1 month ago. The symptoms are worsening. The symptoms are chronic. The symptoms occur constantly.  Mouth pain occurs constantly. Mouth pain is worsening. Affected locations include: teeth.  Additional symptoms include: dental sensitivity to temperature and gum tenderness. Additional symptoms do not include: gum swelling, purulent gums, trismus, jaw pain, facial swelling, trouble swallowing, drooling and swollen glands. Medical issues include: smoking and periodontal disease.    Past Medical History  Diagnosis Date  . H/O back injury     Past Surgical History  Procedure Laterality Date  . Skin biopsy      Family History  Problem Relation Age of Onset  . Cancer Father     History  Substance Use Topics  . Smoking status: Current Every Day Smoker -- 0.50 packs/day    Types: Cigarettes  . Smokeless tobacco: Never Used  . Alcohol Use: No      Review of Systems  Constitutional: Negative for fever and appetite change.  HENT: Positive for dental problem. Negative for congestion, sore throat, facial swelling, drooling, trouble swallowing, neck pain and neck stiffness.   Eyes: Negative for pain and visual disturbance.  Respiratory: Negative for cough and shortness of breath.   Neurological: Negative for dizziness, facial asymmetry and headaches.  Hematological: Negative for adenopathy.    All other systems reviewed and are negative.    Allergies  Vicodin  Home Medications   Current Outpatient Rx  Name  Route  Sig  Dispense  Refill  . ibuprofen (ADVIL,MOTRIN) 800 MG tablet   Oral   Take 1 tablet (800 mg total) by mouth 3 (three) times daily.   21 tablet   0     BP 137/87  Pulse 97  Temp(Src) 97.7 F (36.5 C) (Oral)  Resp 20  Ht 6' (1.829 m)  Wt 220 lb (99.791 kg)  BMI 29.83 kg/m2  SpO2 100%  Physical Exam  Nursing note and vitals reviewed. Constitutional: He is oriented to person, place, and time. He appears well-developed and well-nourished. No distress.  HENT:  Head: Normocephalic and atraumatic. No trismus in the jaw.  Right Ear: Tympanic membrane and ear canal normal.  Left Ear: Tympanic membrane and ear canal normal.  Mouth/Throat: Uvula is midline, oropharynx is clear and moist and mucous membranes are normal. Dental caries present. No dental abscesses or edematous.    Dental caries w/o abscess, trismus facial edema or sublingual abnml.    Neck: Normal range of motion. Neck supple.  Cardiovascular: Normal rate, regular rhythm and normal heart sounds.   No murmur heard. Pulmonary/Chest: Effort normal and breath sounds normal.  Musculoskeletal: Normal range of motion.  Lymphadenopathy:    He has no cervical adenopathy.  Neurological: He is alert and oriented to person, place, and time. He exhibits normal muscle tone. Coordination normal.  Skin: Skin is warm and dry.    ED Course  Procedures (including critical care time)  Labs Reviewed - No data  to display No results found.      MDM   Previous ED charts reviewed by me.  Patient also reviewed on the [place] narcotics database,   Pt advised to f/u with dentist.  referal info given  The patient appears reasonably screened and/or stabilized for discharge and I doubt any other medical condition or other [facility] requiring further screening, evaluation, or treatment in the ED at this time prior  to discharge.       Jameelah Watts L. Janifer Gieselman, PA-C 03/04/13 1326

## 2013-03-01 NOTE — ED Notes (Signed)
Pt reports dental pain for 2 months, has no dentist. Pain worse for last few days.

## 2013-03-04 NOTE — ED Provider Notes (Signed)
Medical screening examination/treatment/procedure(s) were performed by non-physician practitioner and as supervising physician I was immediately available for consultation/collaboration.  Almalik Weissberg, MD 03/04/13 1423 

## 2013-04-23 ENCOUNTER — Emergency Department (HOSPITAL_COMMUNITY)
Admission: EM | Admit: 2013-04-23 | Discharge: 2013-04-24 | Disposition: A | Discharge status: Home / Self Care | Payer: Self-pay | Attending: Emergency Medicine | Admitting: Emergency Medicine

## 2013-04-23 ENCOUNTER — Encounter (HOSPITAL_COMMUNITY): Payer: Self-pay | Admitting: *Deleted

## 2013-04-23 DIAGNOSIS — F17200 Nicotine dependence, unspecified, uncomplicated: Secondary | ICD-10-CM | POA: Insufficient documentation

## 2013-04-23 DIAGNOSIS — L03116 Cellulitis of left lower limb: Secondary | ICD-10-CM

## 2013-04-23 DIAGNOSIS — Z87828 Personal history of other (healed) physical injury and trauma: Secondary | ICD-10-CM | POA: Insufficient documentation

## 2013-04-23 DIAGNOSIS — L02419 Cutaneous abscess of limb, unspecified: Secondary | ICD-10-CM | POA: Insufficient documentation

## 2013-04-23 DIAGNOSIS — L02416 Cutaneous abscess of left lower limb: Secondary | ICD-10-CM

## 2013-04-23 MED ORDER — SULFAMETHOXAZOLE-TMP DS 800-160 MG PO TABS
1.0000 | ORAL_TABLET | Freq: Once | ORAL | Status: AC
  Administered 2013-04-24: 1 via ORAL
  Filled 2013-04-23: qty 1

## 2013-04-23 MED ORDER — CEPHALEXIN 500 MG PO CAPS
500.0000 mg | ORAL_CAPSULE | Freq: Once | ORAL | Status: AC
  Administered 2013-04-24: 500 mg via ORAL
  Filled 2013-04-23: qty 1

## 2013-04-23 NOTE — ED Provider Notes (Signed)
History  This chart was scribed for Jaynie Crumble, PA-C working with Flint Melter, MD by Jiles Prows, ED scribe. This patient was seen in room WTR7/WTR7 and the patient's care was started at 11:34 PM.  CSN: [id number]  Arrival date & time 04/23/13  2245  Chief Complaint  Patient presents with  . Abscess   The history is provided by the patient and medical records. No language interpreter was used.   HPI Comments: [name] is a 37 y.o. male who presents to the Emergency Department complaining of constant worsening pain to upper left thigh that began 4 days ago.  He states ibuprofen and warm compresses have not offered pain relief. He states he is unable to wear pants other than swim shorts dur to irritation of fabric on wound.  Pt thinks it could be a bug bite, as he was camping in the woods last week.  He denies headache, diaphoresis, fever, chills, nausea, vomiting, diarrhea, weakness, cough, SOB and any other pain.   Past Medical History  Diagnosis Date  . H/O back injury     Past Surgical History  Procedure Laterality Date  . Skin biopsy      Family History  Problem Relation Age of Onset  . Cancer Father     History  Substance Use Topics  . Smoking status: Current Every Day Smoker -- 0.50 packs/day    Types: Cigarettes  . Smokeless tobacco: Never Used  . Alcohol Use: No     Review of Systems  Constitutional: Negative for fever and chills.  HENT: Negative for congestion, sore throat and rhinorrhea.   Respiratory: Negative for cough and shortness of breath.   Gastrointestinal: Negative for nausea, vomiting and diarrhea.  Skin: Positive for wound (left upper thigh).  Neurological: Negative for dizziness and weakness.  All other systems reviewed and are negative.   Allergies  Vicodin  Home Medications  No current outpatient prescriptions on file.  Triage Vitals: BP 145/85  Pulse 103  Temp(Src) 98.4 F (36.9 C) (Oral)  Resp 20  Ht 6' (1.829 m)  Wt  220 lb (99.791 kg)  BMI 29.83 kg/m2  SpO2 100%  Physical Exam  Nursing note and vitals reviewed. Constitutional: He is oriented to person, place, and time. He appears well-developed and well-nourished. No distress.  HENT:  Head: Normocephalic and atraumatic.  Eyes: EOM are normal.  Neck: Neck supple. No tracheal deviation present.  Cardiovascular: Normal rate.   Pulmonary/Chest: Effort normal. No respiratory distress.  Musculoskeletal: Normal range of motion.  Neurological: He is alert and oriented to person, place, and time.  Skin: Skin is warm and dry.  4 x 4 cm abscess to left anterior thigh with surrounding cellulitis.  No drainage.  Psychiatric: He has a normal mood and affect. His behavior is normal.    ED Course  Procedures (including critical care time) DIAGNOSTIC STUDIES: Oxygen Saturation is 100% on RA, normnal by my interpretation.    COORDINATION OF CARE: 11:45 PM - Discussed ED treatment with pt at bedside including antibiotics and abscess drainage and pt agrees.  11:53 PM - Drainage of abscess.  INCISION AND DRAINAGE Performed by: Jaynie Crumble A Consent: Verbal consent obtained. Risks and benefits: risks, benefits and alternatives were discussed Type: abscess  Body area: left thigh Anesthesia: local infiltration  Incision was made with a scalpel.  Local anesthetic: lidocaine 2 w/pinephrine  Anesthetic total: 3ml  Complexity: complex Blunt dissection to break up loculations  Drainage: purulent  Drainage amount:  large Packing material: 1/4 in iodoform gauze  Patient tolerance: Patient tolerated the procedure well with no immediate complications.     1. Abscess of left thigh   2. Cellulitis of left thigh      MDM  Pt with abscess to left thigh with surrounding cellulitis. Abscess I&Ded. Cellulitic area marked with a permanent marker. Pt is afebrile, non toxic. Home with ibuprofen, ultram, keflex and bactrim. Follow up in  2 days.    Filed Vitals:   04/23/13 2314  BP: 145/85  Pulse: 103  Temp: 98.4 F (36.9 C)  TempSrc: Oral  Resp: 20  Height: 6' (1.829 m)  Weight: 220 lb (99.791 kg)  SpO2: 100%    I personally performed the services described in this documentation, which was scribed in my presence. The recorded information has been reviewed and is accurate.   Lottie Mussel, PA-C 04/24/13 [phone]

## 2013-04-23 NOTE — ED Notes (Signed)
Pt in c/o abscess or possible bug bite to left upper thigh first noted 4 days ago, raised area with redness and swelling

## 2013-04-24 MED ORDER — SULFAMETHOXAZOLE-TRIMETHOPRIM 800-160 MG PO TABS: 1.0000 | ORAL_TABLET | Freq: Two times a day (BID) | ORAL | Status: DC

## 2013-04-24 MED ORDER — TRAMADOL HCL 50 MG PO TABS: 50.0000 mg | ORAL_TABLET | Freq: Four times a day (QID) | ORAL | Status: DC | PRN

## 2013-04-24 MED ORDER — IBUPROFEN 800 MG PO TABS: 800.0000 mg | ORAL_TABLET | Freq: Three times a day (TID) | ORAL | Status: DC

## 2013-04-24 MED ORDER — CEPHALEXIN 500 MG PO CAPS: 500.0000 mg | ORAL_CAPSULE | Freq: Two times a day (BID) | ORAL | Status: DC

## 2013-04-24 NOTE — ED Provider Notes (Signed)
Medical screening examination/treatment/procedure(s) were performed by non-physician practitioner and as supervising physician I was immediately available for consultation/collaboration.  Flint Melter, MD 04/24/13 1530

## 2013-06-13 ENCOUNTER — Encounter (HOSPITAL_BASED_OUTPATIENT_CLINIC_OR_DEPARTMENT_OTHER): Payer: Self-pay | Admitting: *Deleted

## 2013-06-13 ENCOUNTER — Emergency Department (HOSPITAL_BASED_OUTPATIENT_CLINIC_OR_DEPARTMENT_OTHER)
Admission: EM | Admit: 2013-06-13 | Discharge: 2013-06-13 | Disposition: A | Discharge status: Home / Self Care | Payer: Self-pay | Attending: Emergency Medicine | Admitting: Emergency Medicine

## 2013-06-13 DIAGNOSIS — L02419 Cutaneous abscess of limb, unspecified: Secondary | ICD-10-CM | POA: Insufficient documentation

## 2013-06-13 DIAGNOSIS — Z87828 Personal history of other (healed) physical injury and trauma: Secondary | ICD-10-CM | POA: Insufficient documentation

## 2013-06-13 DIAGNOSIS — F17200 Nicotine dependence, unspecified, uncomplicated: Secondary | ICD-10-CM | POA: Insufficient documentation

## 2013-06-13 MED ORDER — OXYCODONE-ACETAMINOPHEN 5-325 MG PO TABS
2.0000 | ORAL_TABLET | Freq: Once | ORAL | Status: AC
  Administered 2013-06-13: 2 via ORAL
  Filled 2013-06-13 (×2): qty 2

## 2013-06-13 MED ORDER — OXYCODONE-ACETAMINOPHEN 5-325 MG PO TABS: 1.0000 | ORAL_TABLET | Freq: Four times a day (QID) | ORAL | Status: DC | PRN

## 2013-06-13 MED ORDER — SULFAMETHOXAZOLE-TMP DS 800-160 MG PO TABS
2.0000 | ORAL_TABLET | Freq: Once | ORAL | Status: AC
  Administered 2013-06-13: 2 via ORAL
  Filled 2013-06-13: qty 2

## 2013-06-13 MED ORDER — SULFAMETHOXAZOLE-TRIMETHOPRIM 800-160 MG PO TABS: 2.0000 | ORAL_TABLET | Freq: Two times a day (BID) | ORAL | Status: DC

## 2013-06-13 NOTE — ED Provider Notes (Signed)
   History    CSN: [id number] Arrival date & time 06/13/13  0254  First MD Initiated Contact with Patient 06/13/13 [phone]     Chief Complaint  Patient presents with  . Abscess   (Consider location/radiation/quality/duration/timing/severity/associated sxs/prior Treatment) HPI Is a 37 year old male with a tender swollen area to the inferior aspect of his left calf. There is associated surrounding erythema and edema. The lesion is very painful and worse with palpation, movement or ambulation.  Past Medical History  Diagnosis Date  . H/O back injury    Past Surgical History  Procedure Laterality Date  . Skin biopsy     Family History  Problem Relation Age of Onset  . Cancer Father    History  Substance Use Topics  . Smoking status: Current Every Day Smoker -- 0.50 packs/day    Types: Cigarettes  . Smokeless tobacco: Never Used  . Alcohol Use: No    Review of Systems  All other systems reviewed and are negative.    Allergies  Vicodin  Home Medications   Current Outpatient Rx  Name  Route  Sig  Dispense  Refill  . cephALEXin (KEFLEX) 500 MG capsule   Oral   Take 1 capsule (500 mg total) by mouth 2 (two) times daily.   20 capsule   0   . ibuprofen (ADVIL,MOTRIN) 800 MG tablet   Oral   Take 1 tablet (800 mg total) by mouth 3 (three) times daily.   21 tablet   0   . sulfamethoxazole-trimethoprim (SEPTRA DS) 800-160 MG per tablet   Oral   Take 1 tablet by mouth every 12 (twelve) hours.   20 tablet   0   . traMADol (ULTRAM) 50 MG tablet   Oral   Take 1 tablet (50 mg total) by mouth every 6 (six) hours as needed for pain.   15 tablet   0    BP 130/83  Pulse 103  Temp(Src) 98.7 F (37.1 C) (Oral)  Resp 18  Ht 6' (1.829 m)  Wt 205 lb (92.987 kg)  BMI 27.8 kg/m2  SpO2 100%  Physical Exam General: Well-developed, well-nourished male in no acute distress; appearance consistent with age of record HENT: normocephalic, atraumatic Eyes: Normal  appearance Neck: supple Heart: regular rate and rhythm Lungs: Normal respiratory effort and excursion Abdomen: soft; nondistended Extremities: No deformity; full range of motion Neurologic: Awake, alert and oriented; motor function intact in all extremities and symmetric; no facial droop Skin: Pointing abscess left distal calf with surrounding erythema, tenderness and edema Psychiatric: Normal mood and affect    ED Course  Procedures (including critical care time)  INCISION AND DRAINAGE Performed by: Paula Libra L Consent: Verbal consent obtained. Risks and benefits: risks, benefits and alternatives were discussed Type: abscess  Body area: Left distal calf  Anesthesia: local infiltration  Incision was made with a scalpel.  Local anesthetic: lidocaine 2 % with epinephrine  Anesthetic total: 1 ml  Complexity: complex Blunt dissection to break up loculations  Drainage: purulent  Drainage amount: Moderate   Packing material: 1/4 in iodoform gauze  Patient tolerance: Patient tolerated the procedure well with no immediate complications.     MDM  Because of surrounding erythema consistent with cellulitis we will treat with Bactrim for likely MRSA infection. He was advised to remove the packing in 48 hours. He was advised to return should symptoms worsen instead of improve during that time.  Hanley Seamen, MD 06/13/13 [phone]

## 2013-06-13 NOTE — ED Notes (Signed)
MD at bedside. 

## 2013-06-13 NOTE — ED Notes (Signed)
Pt c/o abscess to left lower, posterior leg x2 days. Same is red and warm to the touch.

## 2013-07-10 ENCOUNTER — Emergency Department (HOSPITAL_BASED_OUTPATIENT_CLINIC_OR_DEPARTMENT_OTHER)
Admission: EM | Admit: 2013-07-10 | Discharge: 2013-07-10 | Disposition: A | Discharge status: Home / Self Care | Payer: Self-pay | Attending: Emergency Medicine | Admitting: Emergency Medicine

## 2013-07-10 ENCOUNTER — Encounter (HOSPITAL_BASED_OUTPATIENT_CLINIC_OR_DEPARTMENT_OTHER): Payer: Self-pay | Admitting: Emergency Medicine

## 2013-07-10 DIAGNOSIS — L03115 Cellulitis of right lower limb: Secondary | ICD-10-CM

## 2013-07-10 DIAGNOSIS — Z87828 Personal history of other (healed) physical injury and trauma: Secondary | ICD-10-CM | POA: Insufficient documentation

## 2013-07-10 DIAGNOSIS — F17200 Nicotine dependence, unspecified, uncomplicated: Secondary | ICD-10-CM | POA: Insufficient documentation

## 2013-07-10 DIAGNOSIS — L02419 Cutaneous abscess of limb, unspecified: Secondary | ICD-10-CM | POA: Insufficient documentation

## 2013-07-10 MED ORDER — VANCOMYCIN HCL IN DEXTROSE 1-5 GM/200ML-% IV SOLN
INTRAVENOUS | Status: AC
  Administered 2013-07-10: 1000 mg via INTRAVENOUS
  Filled 2013-07-10: qty 200

## 2013-07-10 MED ORDER — SULFAMETHOXAZOLE-TRIMETHOPRIM 800-160 MG PO TABS: 2.0000 | ORAL_TABLET | Freq: Two times a day (BID) | ORAL | Status: DC

## 2013-07-10 MED ORDER — OXYCODONE-ACETAMINOPHEN 5-325 MG PO TABS
1.0000 | ORAL_TABLET | Freq: Once | ORAL | Status: AC
  Administered 2013-07-10: 1 via ORAL
  Filled 2013-07-10 (×2): qty 1

## 2013-07-10 MED ORDER — OXYCODONE-ACETAMINOPHEN 5-325 MG PO TABS: 1.0000 | ORAL_TABLET | Freq: Four times a day (QID) | ORAL | Status: DC | PRN

## 2013-07-10 MED ORDER — VANCOMYCIN HCL IN DEXTROSE 1-5 GM/200ML-% IV SOLN
1000.0000 mg | Freq: Once | INTRAVENOUS | Status: AC
  Administered 2013-07-10: 1000 mg via INTRAVENOUS

## 2013-07-10 NOTE — ED Provider Notes (Signed)
CSN: [id number]     Arrival date & time 07/10/13  0306 History     First MD Initiated Contact with Patient 07/10/13 0319     Chief Complaint  Patient presents with  . 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 He is here with a tender swollen lesion of the right anterior knee. It began 4 days ago and has gradually worsened. There is surrounding erythema. This lesion is tender, pain worse wit palpation or flexion of the right knee. The pain is moderate to severe at its worst. He has not had any systemic symptoms such as fever or chills.  Past Medical History  Diagnosis Date  . H/O back injury    Past Surgical History  Procedure Laterality Date  . Skin biopsy     Family History  Problem Relation Age of Onset  . Cancer Father    History  Substance Use Topics  . Smoking status: Current Every Day Smoker -- 0.50 packs/day    Types: Cigarettes  . Smokeless tobacco: Never Used  . Alcohol Use: No    Review of Systems  All other systems reviewed and are negative.    Allergies  Vicodin  Home Medications   Current Outpatient Rx  Name  Route  Sig  Dispense  Refill  . cephALEXin (KEFLEX) 500 MG capsule   Oral   Take 1 capsule (500 mg total) by mouth 2 (two) times daily.   20 capsule   0   . ibuprofen (ADVIL,MOTRIN) 800 MG tablet   Oral   Take 1 tablet (800 mg total) by mouth 3 (three) times daily.   21 tablet   0   . oxyCODONE-acetaminophen (PERCOCET) 5-325 MG per tablet   Oral   Take 1-2 tablets by mouth every 6 (six) hours as needed for pain.   10 tablet   0   . sulfamethoxazole-trimethoprim (SEPTRA DS) 800-160 MG per tablet   Oral   Take 2 tablets by mouth 2 (two) times daily.   20 tablet   0   . traMADol (ULTRAM) 50 MG tablet   Oral   Take 1 tablet (50 mg total) by mouth every 6 (six) hours as  needed for pain.   15 tablet   0    BP 125/81  Pulse 81  Temp(Src) 98.4 F (36.9 C) (Oral)  Resp 20  SpO2 100%  Physical Exam General: Well-developed, well-nourished male in no acute distress; appearance consistent with age of record HENT: normocephalic, atraumatic Eyes: pupils equal round and reactive to light; extraocular muscles intact Neck: supple Heart: regular rate and rhythm Lungs: clear to auscultation bilaterally Abdomen: soft; nondistended Extremities: No deformity; full range of motion; pulses normal Neurologic: Awake, alert and oriented; motor function intact in all extremities and symmetric; no facial droop Skin: Warm and dry; tender, indurated lesion right anterior knee consistent with early abscess with surrounding irregularly shaped erythema approximately 15cm x 15 cm Psychiatric: Normal mood and affect    ED Course   Procedures (including critical care time)  No pus pocket identified with bedside ultrasound. I&D not indicated at this time. We will administer 1g of vancomycin IV in the ED and discharge him on Bactrim. He has had successful treatment with Bactrim in the past.   MDM  We will have him return in 48 hours if symptoms are worsening his I&D may be required at that time.  Hanley Seamen, MD 07/10/13 [phone]

## 2013-07-10 NOTE — ED Notes (Signed)
Edematous area to right proximal anterior knee, erythematous,  Pt reports having area abscessed to right calf that was lanced and drained 1 month ago

## 2013-07-26 ENCOUNTER — Ambulatory Visit: Payer: Self-pay | Admitting: Internal Medicine

## 2013-08-31 ENCOUNTER — Encounter (HOSPITAL_BASED_OUTPATIENT_CLINIC_OR_DEPARTMENT_OTHER): Payer: Self-pay | Admitting: Emergency Medicine

## 2013-08-31 ENCOUNTER — Emergency Department (HOSPITAL_BASED_OUTPATIENT_CLINIC_OR_DEPARTMENT_OTHER)
Admission: EM | Admit: 2013-08-31 | Discharge: 2013-08-31 | Disposition: A | Discharge status: Home / Self Care | Payer: Self-pay | Attending: Emergency Medicine | Admitting: Emergency Medicine

## 2013-08-31 DIAGNOSIS — Z87828 Personal history of other (healed) physical injury and trauma: Secondary | ICD-10-CM | POA: Insufficient documentation

## 2013-08-31 DIAGNOSIS — L0390 Cellulitis, unspecified: Secondary | ICD-10-CM

## 2013-08-31 DIAGNOSIS — L0201 Cutaneous abscess of face: Secondary | ICD-10-CM | POA: Insufficient documentation

## 2013-08-31 DIAGNOSIS — F17200 Nicotine dependence, unspecified, uncomplicated: Secondary | ICD-10-CM | POA: Insufficient documentation

## 2013-08-31 DIAGNOSIS — L03211 Cellulitis of face: Secondary | ICD-10-CM | POA: Insufficient documentation

## 2013-08-31 MED ORDER — TRAMADOL HCL 50 MG PO TABS: 50.0000 mg | ORAL_TABLET | Freq: Four times a day (QID) | ORAL | Status: DC | PRN

## 2013-08-31 MED ORDER — CLINDAMYCIN HCL 300 MG PO CAPS: 300.0000 mg | ORAL_CAPSULE | Freq: Four times a day (QID) | ORAL | Status: DC

## 2013-08-31 MED ORDER — GUAIFENESIN ER 600 MG PO TB12: 1200.0000 mg | ORAL_TABLET | Freq: Two times a day (BID) | ORAL | Status: DC

## 2013-08-31 MED ORDER — GUAIFENESIN 100 MG/5ML PO LIQD
100.0000 mg | ORAL | Status: DC | PRN
Start: 2013-08-31 — End: 2013-08-31

## 2013-08-31 MED ORDER — IBUPROFEN 600 MG PO TABS: 600.0000 mg | ORAL_TABLET | Freq: Four times a day (QID) | ORAL | Status: DC | PRN

## 2013-08-31 NOTE — ED Provider Notes (Signed)
CSN: [id number]     Arrival date & time 08/31/13  0013 History   First MD Initiated Contact with Patient 08/31/13 0050     Chief Complaint  Patient presents with  . Abscess   (Consider location/radiation/quality/duration/timing/severity/associated sxs/prior Treatment) Patient is a 37 y.o. male presenting with abscess. The history is provided by the patient.  Abscess Location:  Head/neck and face Facial abscess location:  Nose Abscess quality: induration and painful   Abscess quality: not draining, no fluctuance and not weeping   Red streaking: no   Progression:  Worsening Pain details:    Quality:  Aching   Timing:  Constant   Progression:  Worsening Context: not diabetes   Relieved by:  Nothing Worsened by:  Nothing tried Ineffective treatments:  None tried Associated symptoms: no anorexia     Patient has pain and states right side of the noses feels swollen.    Past Medical History  Diagnosis Date  . H/O back injury    Past Surgical History  Procedure Laterality Date  . Skin biopsy     Family History  Problem Relation Age of Onset  . Cancer Father    History  Substance Use Topics  . Smoking status: Current Every Day Smoker -- 0.50 packs/day    Types: Cigarettes  . Smokeless tobacco: Never Used  . Alcohol Use: No    Review of Systems  Gastrointestinal: Negative for anorexia.  All other systems reviewed and are negative.    Allergies  Vicodin and Adhesive  Home Medications   Current Outpatient Rx  Name  Route  Sig  Dispense  Refill  . cephALEXin (KEFLEX) 500 MG capsule   Oral   Take 1 capsule (500 mg total) by mouth 2 (two) times daily.   20 capsule   0   . clindamycin (CLEOCIN) 300 MG capsule   Oral   Take 1 capsule (300 mg total) by mouth 4 (four) times daily. X 7 days   28 capsule   0   . guaiFENesin (ROBITUSSIN) 100 MG/5ML liquid   Oral   Take 5-10 mLs (100-200 mg total) by mouth every 4 (four) hours as needed for cough.   60 mL   0   . ibuprofen (ADVIL,MOTRIN) 600 MG tablet   Oral   Take 1 tablet (600 mg total) by mouth every 6 (six) hours as needed for pain.   30 tablet   0   . ibuprofen (ADVIL,MOTRIN) 800 MG tablet   Oral   Take 1 tablet (800 mg total) by mouth 3 (three) times daily.   21 tablet   0   . oxyCODONE-acetaminophen (PERCOCET) 5-325 MG per tablet   Oral   Take 1-2 tablets by mouth every 6 (six) hours as needed for pain.   10 tablet   0   . sulfamethoxazole-trimethoprim (SEPTRA DS) 800-160 MG per tablet   Oral   Take 2 tablets by mouth 2 (two) times daily.   28 tablet   0   . traMADol (ULTRAM) 50 MG tablet   Oral   Take 1 tablet (50 mg total) by mouth every 6 (six) hours as needed for pain.   15 tablet   0    BP 133/82  Pulse 99  Temp(Src) 98.3 F (36.8 C) (Oral)  Resp 16  Ht 6' (1.829 m)  Wt 212 lb (96.163 kg)  BMI 28.75 kg/m2  SpO2 100% Physical Exam  Constitutional: He is oriented to person, place, and time. He appears well-developed  and well-nourished. No distress.  HENT:  Head: Normocephalic and atraumatic.  Nose: Mucosal edema and sinus tenderness present. No nasal deformity or nasal septal hematoma.    Mouth/Throat: Oropharynx is clear and moist.  Boggy nasal turbimnates   Eyes: Conjunctivae are normal. Pupils are equal, round, and reactive to light.  Neck: Normal range of motion. Neck supple.  Cardiovascular: Normal rate and regular rhythm.   Pulmonary/Chest: Effort normal and breath sounds normal. He has no wheezes. He has no rales.  Abdominal: Soft. Bowel sounds are normal.  Musculoskeletal: Normal range of motion.  Neurological: He is alert and oriented to person, place, and time.  Skin: Skin is warm and dry.  Psychiatric: He has a normal mood and affect.    ED Course  Procedures (including critical care time) Labs Review Labs Reviewed - No data to display Imaging Review No results found.  EKG Interpretation   None       MDM   1. Cellulitis     Will treat for cellulitis, has inflamed boggy turbinates.  Patient thinks that is an abscess.  No abscess at this time    Adrian Speers K Zigmond Trela-Rasch, MD 08/31/13 0110

## 2013-08-31 NOTE — ED Notes (Signed)
Swelling to right side of nose.  Pt sts he believes it is an abscess.  Hx of many abscesses.

## 2013-10-14 ENCOUNTER — Encounter (HOSPITAL_BASED_OUTPATIENT_CLINIC_OR_DEPARTMENT_OTHER): Payer: Self-pay | Admitting: Emergency Medicine

## 2013-10-14 ENCOUNTER — Emergency Department (HOSPITAL_BASED_OUTPATIENT_CLINIC_OR_DEPARTMENT_OTHER)
Admission: EM | Admit: 2013-10-14 | Discharge: 2013-10-14 | Disposition: A | Discharge status: Home / Self Care | Payer: Self-pay | Attending: Emergency Medicine | Admitting: Emergency Medicine

## 2013-10-14 DIAGNOSIS — L0291 Cutaneous abscess, unspecified: Secondary | ICD-10-CM

## 2013-10-14 DIAGNOSIS — IMO0002 Reserved for concepts with insufficient information to code with codable children: Secondary | ICD-10-CM | POA: Insufficient documentation

## 2013-10-14 DIAGNOSIS — Z87828 Personal history of other (healed) physical injury and trauma: Secondary | ICD-10-CM | POA: Insufficient documentation

## 2013-10-14 DIAGNOSIS — F17200 Nicotine dependence, unspecified, uncomplicated: Secondary | ICD-10-CM | POA: Insufficient documentation

## 2013-10-14 MED ORDER — SULFAMETHOXAZOLE-TRIMETHOPRIM 800-160 MG PO TABS: 1.0000 | ORAL_TABLET | Freq: Two times a day (BID) | ORAL | Status: AC

## 2013-10-14 MED ORDER — OXYCODONE-ACETAMINOPHEN 5-325 MG PO TABS: 1.0000 | ORAL_TABLET | ORAL | Status: DC | PRN

## 2013-10-14 NOTE — ED Notes (Signed)
Patient here with left axilla abscess and left ac abscess. Axilla draining, hx of same. Pain and swelling to both x 1 week.

## 2013-10-14 NOTE — ED Provider Notes (Signed)
CSN: [id number]     Arrival date & time 10/14/13  1610 History   First MD Initiated Contact with Patient 10/14/13 1656     Chief Complaint  Patient presents with  . Abscess   (Consider location/radiation/quality/duration/timing/severity/associated sxs/prior Treatment) The history is provided by the patient.   Abscess: Patient presents for evaluation of  2 cutaneous abscess. Lesion is located in the left axilla and Left cubital fossa. Onset was a few days ago. Symptoms have gradually worsened. Abscess has associated symptoms of pain. Patient does have previous history of cutaneous abscesses. Patient does not have diabetes. Patient denies any fever, chest pain, or dyspnea. Denies IVDU.   Past Medical History  Diagnosis Date  . H/O back injury    Past Surgical History  Procedure Laterality Date  . Skin biopsy     Family History  Problem Relation Age of Onset  . Cancer Father    History  Substance Use Topics  . Smoking status: Current Every Day Smoker -- 0.50 packs/day    Types: Cigarettes  . Smokeless tobacco: Never Used  . Alcohol Use: No    Review of Systems  Constitutional: Negative for fever and chills.  Respiratory: Negative for shortness of breath.   Cardiovascular: Negative for chest pain.  Skin: Positive for wound.  Neurological: Negative for dizziness and light-headedness.  All other systems reviewed and are negative.    Allergies  Vicodin and Adhesive  Home Medications   Current Outpatient Rx  Name  Route  Sig  Dispense  Refill  . oxyCODONE-acetaminophen (PERCOCET/ROXICET) 5-325 MG per tablet   Oral   Take 1-2 tablets by mouth every 4 (four) hours as needed for severe pain.   15 tablet   0   . sulfamethoxazole-trimethoprim (BACTRIM DS,SEPTRA DS) 800-160 MG per tablet   Oral   Take 1 tablet by mouth 2 (two) times daily.   20 tablet   0    BP 121/75  Pulse 117  Temp(Src) 99.5 F (37.5 C) (Oral)  Resp 16  SpO2 97% Physical Exam    Constitutional: He is oriented to person, place, and time. He appears well-developed and well-nourished. No distress.  HENT:  Head: Normocephalic and atraumatic.  Neck: Normal range of motion. Neck supple.  Cardiovascular: Normal rate and regular rhythm.  Exam reveals no gallop and no friction rub.   No murmur heard. Pulmonary/Chest: Effort normal and breath sounds normal.  Musculoskeletal: Normal range of motion.       Left elbow: He exhibits normal range of motion.  Neurological: He is alert and oriented to person, place, and time.  Skin: Skin is warm and dry.     Psychiatric: He has a normal mood and affect. His behavior is normal.    ED Course  Procedures (including critical care time) Labs Review Labs Reviewed  WOUND CULTURE   Imaging Review No results found.  EKG Interpretation   None      INCISION AND DRAINAGE Performed by: Rudene Anda Consent: Verbal consent obtained. Risks and benefits: risks, benefits and alternatives were discussed Type: abscess  Body area: Left antecubital fossa   Anesthesia: local infiltration  Incision was made with a scalpel.  Local anesthetic: lidocaine 2% w/o epinephrine  Anesthetic total:  3 ml  Complexity: complex Blunt dissection to break up loculations  Drainage: purulent  Drainage amount: 5 ml  Packing material: 1/4 in iodoform gauze  Patient tolerance: Patient tolerated the procedure well with no immediate complications.  Culture obtained.  INCISION AND DRAINAGE Performed by: Rudene Anda Consent: Verbal consent obtained. Risks and benefits: risks, benefits and alternatives were discussed Type: abscess  Body area: Left axilla  Anesthesia: local infiltration  Incision was made with a scalpel.  Local anesthetic: lidocaine 2% w/o epinephrine  Anesthetic total: 2 ml  Complexity: complex Blunt dissection to break up loculations  Drainage: purulent  Drainage amount: 2 ml  Packing  material: 1/4 in iodoform gauze  Patient tolerance: Patient tolerated the procedure well with no immediate complications.     MDM   1. Abscess    Patient presents for I&D of 2 abscesses (Left axilla; Left antecubital fossa). Consent was obtained prior to procedure. Both abscesses were drained successfully without complication. Patient tolerated procedure well. Culture obtained. Patient vitals rechecked prior to discharge. HR returned to pre-procedure baseline. Discussed good hygiene and use of antibacterial soaps with patient. Pt scheduled to follow up in 2 days for wound recheck and packing removal. Pt discharged with pain medicine and antibiotics to cover for MRSA. Advised to return to ED sooner if should develop fever, chills, or symptoms worsen.     Rudene Anda, PA-C 10/16/13 0031  Rudene Anda, PA-C 10/16/13 [phone]

## 2013-10-16 NOTE — ED Provider Notes (Signed)
Medical screening examination/treatment/procedure(s) were performed by non-physician practitioner and as supervising physician I was immediately available for consultation/collaboration.  EKG Interpretation   None         Charles B. Sheldon, MD 10/16/13 2018 

## 2013-10-18 LAB — WOUND CULTURE

## 2013-10-19 ENCOUNTER — Telehealth (HOSPITAL_COMMUNITY): Payer: Self-pay | Admitting: Emergency Medicine

## 2013-10-19 NOTE — ED Notes (Signed)
Post ED Visit - Positive Culture Follow-up  Culture report reviewed by antimicrobial stewardship pharmacist: []  Wes Dulaney, Pharm.D., BCPS [x]  Celedonio Miyamoto, Pharm.D., BCPS []  Georgina Pillion, Pharm.D., BCPS []  [place], [place].D., BCPS, AAHIVP []  Estella Husk, Pharm.D., BCPS, AAHIVP  Positive wound culture Treated with Sulfa-Trimeth, organism sensitive to the same and no further patient follow-up is required at this time.  Kylie A Holland 10/19/2013, 11:00 AM

## 2013-11-01 ENCOUNTER — Encounter (HOSPITAL_BASED_OUTPATIENT_CLINIC_OR_DEPARTMENT_OTHER): Payer: Self-pay | Admitting: Emergency Medicine

## 2013-11-01 ENCOUNTER — Emergency Department (HOSPITAL_BASED_OUTPATIENT_CLINIC_OR_DEPARTMENT_OTHER)
Admission: EM | Admit: 2013-11-01 | Discharge: 2013-11-01 | Disposition: A | Discharge status: Home / Self Care | Payer: Self-pay | Attending: Emergency Medicine | Admitting: Emergency Medicine

## 2013-11-01 ENCOUNTER — Emergency Department (HOSPITAL_BASED_OUTPATIENT_CLINIC_OR_DEPARTMENT_OTHER): Payer: Self-pay

## 2013-11-01 DIAGNOSIS — F17200 Nicotine dependence, unspecified, uncomplicated: Secondary | ICD-10-CM | POA: Insufficient documentation

## 2013-11-01 DIAGNOSIS — Z8614 Personal history of Methicillin resistant Staphylococcus aureus infection: Secondary | ICD-10-CM | POA: Insufficient documentation

## 2013-11-01 DIAGNOSIS — S6732XA Crushing injury of left wrist, initial encounter: Secondary | ICD-10-CM

## 2013-11-01 DIAGNOSIS — Y990 Civilian activity done for income or pay: Secondary | ICD-10-CM | POA: Insufficient documentation

## 2013-11-01 DIAGNOSIS — Y9389 Activity, other specified: Secondary | ICD-10-CM | POA: Insufficient documentation

## 2013-11-01 DIAGNOSIS — Y9289 Other specified places as the place of occurrence of the external cause: Secondary | ICD-10-CM | POA: Insufficient documentation

## 2013-11-01 DIAGNOSIS — S6730XA Crushing injury of unspecified wrist, initial encounter: Secondary | ICD-10-CM | POA: Insufficient documentation

## 2013-11-01 DIAGNOSIS — W230XXA Caught, crushed, jammed, or pinched between moving objects, initial encounter: Secondary | ICD-10-CM | POA: Insufficient documentation

## 2013-11-01 HISTORY — DX: Methicillin resistant Staphylococcus aureus infection, unspecified site: A49.02

## 2013-11-01 MED ORDER — OXYCODONE-ACETAMINOPHEN 5-325 MG PO TABS
1.0000 | ORAL_TABLET | Freq: Once | ORAL | Status: AC
  Administered 2013-11-01: 1 via ORAL
  Filled 2013-11-01: qty 1

## 2013-11-01 MED ORDER — OXYCODONE-ACETAMINOPHEN 5-325 MG PO TABS: 1.0000 | ORAL_TABLET | Freq: Four times a day (QID) | ORAL | Status: DC | PRN

## 2013-11-01 NOTE — ED Provider Notes (Signed)
CSN: [id number]     Arrival date & time 11/01/13  2233 History  This chart was scribed for Hanley Seamen, MD by Carl Best, ED Scribe. This patient was seen in room MH06/MH06 and the patient's care was started at 11:01 PM.     Chief Complaint  Patient presents with  . Wrist Injury    Patient is a 37 y.o. male presenting with wrist injury. The history is provided by the patient. No language interpreter was used.  Wrist Injury  HPI Comments: OVILA LEPAGE is a 37 y.o. male who presents to the Emergency Department complaining of constant swelling and pain to his to his dorsal left wrist that started yesterday morning at 9 AM after the patient jammed his left wrist with a 24 foot scaffold while the wrist was partially extended.  The patient states that movement and extension aggravates the pain. Pain is moderate and worse with movement or palpation. The patient is right hand dominant.     Past Medical History  Diagnosis Date  . H/O back injury   . MRSA (methicillin resistant Staphylococcus aureus)    Past Surgical History  Procedure Laterality Date  . Skin biopsy     Family History  Problem Relation Age of Onset  . Cancer Father    History  Substance Use Topics  . Smoking status: Heavy Tobacco Smoker -- 2.00 packs/day    Types: Cigarettes  . Smokeless tobacco: Never Used  . Alcohol Use: No    Review of Systems  Musculoskeletal: Positive for arthralgias (left wrist) and joint swelling (left hand and wrist).  All other systems reviewed and are negative.    Allergies  Vicodin and Adhesive  Home Medications   Current Outpatient Rx  Name  Route  Sig  Dispense  Refill  . oxyCODONE-acetaminophen (PERCOCET/ROXICET) 5-325 MG per tablet   Oral   Take 1-2 tablets by mouth every 4 (four) hours as needed for severe pain.   15 tablet   0    Triage Vitals: BP 135/74  Pulse 88  Temp(Src) 99.1 F (37.3 C) (Oral)  Resp 18  Ht 6' (1.829 m)  Wt 210 lb (95.255 kg)  BMI  28.47 kg/m2  SpO2 99%  Physical Exam  Nursing note and vitals reviewed. Constitutional: He is oriented to person, place, and time. He appears well-developed and well-nourished. No distress.  HENT:  Head: Normocephalic and atraumatic.  Right Ear: External ear normal.  Left Ear: External ear normal.  Nose: Nose normal.  Mouth/Throat: Oropharynx is clear and moist.  Eyes: EOM are normal. Pupils are equal, round, and reactive to light.  Neck: Normal range of motion. Neck supple. No tracheal deviation present.  Cardiovascular: Normal rate and regular rhythm.  Exam reveals no gallop and no friction rub.   No murmur heard. Good capillary refill.  Pulmonary/Chest: Effort normal and breath sounds normal. No respiratory distress.  Abdominal: Soft. There is no tenderness. There is no rebound.  Musculoskeletal: Normal range of motion.  Snuff box tenderness on the left.  Pain on movement of thumb.  Thumb is non-tender.  Tenderness of dorsal wrist.  Pain with flexion of the second and third finger of the left hand.  Neurological: He is alert and oriented to person, place, and time.  Good sensation intact.  Skin: Skin is warm and dry.  Psychiatric: He has a normal mood and affect. His behavior is normal.    ED Course  Procedures (including critical care time)  DIAGNOSTIC STUDIES:  Oxygen Saturation is 99% on room air, normal by my interpretation.    COORDINATION OF CARE: 11:03 PM- Discussed a clinical suspicion of the patient's Navicular bone in his left wrist with the patient.  Discussed referring the patient to a hand specialist to have it rex-rayed in a week.  The patient agreed to the treatment plan.     MDM  Nursing notes and vitals signs, including pulse oximetry, reviewed.  Summary of this visit's results, reviewed by myself:  Imaging Studies: Dg Wrist Complete Left  11/16/2013   CLINICAL DATA:  Injury, mid posterior wrist pain  EXAM: LEFT WRIST - COMPLETE 3+ VIEW  COMPARISON:   None.  FINDINGS: There is no evidence of fracture or dislocation. There is no evidence of arthropathy or other focal bone abnormality. Soft tissues are unremarkable.  IMPRESSION: Negative.   Electronically Signed   By: Rise Mu M.D.   On: 16-Nov-2013 23:15      I personally performed the services described in this documentation, which was scribed in my presence.  The recorded information has been reviewed and is accurate.   Hanley Seamen, MD 11-16-13 [phone]

## 2013-11-01 NOTE — ED Notes (Signed)
Dropped heavy object on left wrist yesterday at work. Swelling of hand and wrist. CMS intact to fingers.

## 2014-02-02 ENCOUNTER — Encounter (HOSPITAL_BASED_OUTPATIENT_CLINIC_OR_DEPARTMENT_OTHER): Payer: Self-pay | Admitting: Emergency Medicine

## 2014-02-02 ENCOUNTER — Emergency Department (HOSPITAL_BASED_OUTPATIENT_CLINIC_OR_DEPARTMENT_OTHER)
Admission: EM | Admit: 2014-02-02 | Discharge: 2014-02-03 | Disposition: A | Discharge status: Home / Self Care | Payer: Self-pay | Attending: Emergency Medicine | Admitting: Emergency Medicine

## 2014-02-02 DIAGNOSIS — L02419 Cutaneous abscess of limb, unspecified: Secondary | ICD-10-CM

## 2014-02-02 DIAGNOSIS — IMO0002 Reserved for concepts with insufficient information to code with codable children: Secondary | ICD-10-CM | POA: Insufficient documentation

## 2014-02-02 DIAGNOSIS — Z8614 Personal history of Methicillin resistant Staphylococcus aureus infection: Secondary | ICD-10-CM | POA: Insufficient documentation

## 2014-02-02 DIAGNOSIS — Z87828 Personal history of other (healed) physical injury and trauma: Secondary | ICD-10-CM | POA: Insufficient documentation

## 2014-02-02 DIAGNOSIS — F17200 Nicotine dependence, unspecified, uncomplicated: Secondary | ICD-10-CM | POA: Insufficient documentation

## 2014-02-02 NOTE — ED Notes (Signed)
Abscess under left arm.  Denies fever.  Onset three days ago.

## 2014-02-03 MED ORDER — OXYCODONE-ACETAMINOPHEN 5-325 MG PO TABS: 1.0000 | ORAL_TABLET | ORAL | Status: DC | PRN

## 2014-02-03 MED ORDER — SULFAMETHOXAZOLE-TMP DS 800-160 MG PO TABS: 1.0000 | ORAL_TABLET | Freq: Two times a day (BID) | ORAL | Status: DC

## 2014-02-03 MED ORDER — SULFAMETHOXAZOLE-TMP DS 800-160 MG PO TABS
1.0000 | ORAL_TABLET | Freq: Once | ORAL | Status: AC
  Administered 2014-02-03: 1 via ORAL
  Filled 2014-02-03: qty 1

## 2014-02-03 MED ORDER — OXYCODONE-ACETAMINOPHEN 5-325 MG PO TABS
1.0000 | ORAL_TABLET | Freq: Once | ORAL | Status: AC
  Administered 2014-02-03: 1 via ORAL
  Filled 2014-02-03: qty 1

## 2014-02-03 NOTE — Discharge Instructions (Signed)
Abscess  An abscess is an infected area that contains a collection of pus and debris.It can occur in almost any part of the body. An abscess is also known as a furuncle or boil.  CAUSES   An abscess occurs when tissue gets infected. This can occur from blockage of oil or sweat glands, infection of hair follicles, or a minor injury to the skin. As the body tries to fight the infection, pus collects in the area and creates pressure under the skin. This pressure causes pain. People with weakened immune systems have difficulty fighting infections and get certain abscesses more often.   SYMPTOMS  Usually an abscess develops on the skin and becomes a painful mass that is red, warm, and tender. If the abscess forms under the skin, you may feel a moveable soft area under the skin. Some abscesses break open (rupture) on their own, but most will continue to get worse without care. The infection can spread deeper into the body and eventually into the bloodstream, causing you to feel ill.   DIAGNOSIS   Your caregiver will take your medical history and perform a physical exam. A sample of fluid may also be taken from the abscess to determine what is causing your infection.  TREATMENT   Your caregiver may prescribe antibiotic medicines to fight the infection. However, taking antibiotics alone usually does not cure an abscess. Your caregiver may need to make a small cut (incision) in the abscess to drain the pus. In some cases, gauze is packed into the abscess to reduce pain and to continue draining the area.  HOME CARE INSTRUCTIONS    Only take over-the-counter or prescription medicines for pain, discomfort, or fever as directed by your caregiver.   If you were prescribed antibiotics, take them as directed. Finish them even if you start to feel better.   If gauze is used, follow your caregiver's directions for changing the gauze.   To avoid spreading the infection:   Keep your draining abscess covered with a  bandage.   Wash your hands well.   Do not share personal care items, towels, or whirlpools with others.   Avoid skin contact with others.   Keep your skin and clothes clean around the abscess.   Keep all follow-up appointments as directed by your caregiver.  SEEK MEDICAL CARE IF:    You have increased pain, swelling, redness, fluid drainage, or bleeding.   You have muscle aches, chills, or a general ill feeling.   You have a fever.  MAKE SURE YOU:    Understand these instructions.   Will watch your condition.   Will get help right away if you are not doing well or get worse.  Document Released: 08/18/2005 Document Revised: 05/09/2012 Document Reviewed: 01/21/2012  [employer] Patient Information 2014 [employer]  MRSA Overview  MRSA stands for methicillin-resistant Staphylococcus aureus. It is a type of bacteria that is resistant to some common antibiotics. It can cause infections in the skin and many other places in the body. Staphylococcus aureus, often called "staph," is a bacteria that normally lives on the skin or in the nose. Staph on the surface of the skin or in the nose does not cause problems. However, if the staph enters the body through a cut, wound, or break in the skin, an infection can happen.  Up until recently, infections with the MRSA type of staph mainly occurred in hospitals and other healthcare settings. There are now increasing problems with MRSA infections in the   community as well. Infections with MRSA may be very serious or even life-threatening. Most MRSA infections are acquired in one of two ways:   Healthcare-associated MRSA (HA-MRSA)   This can be acquired by people in any healthcare setting. MRSA can be a big problem for hospitalized people, people in nursing homes, people in rehabilitation facilities, people with weakened immune systems, dialysis patients, and those who have had surgery.   Community-associated MRSA (CA-MRSA)   Community spread of MRSA is becoming more  common. It is known to spread in crowded settings, in jails and prisons, and in situations where there is close skin-to-skin contact, such as during sporting events or in locker rooms. MRSA can be spread through shared items, such as children's toys, razors, towels, or sports equipment.  CAUSES   All staph, including MRSA, are normally harmless unless they enter the body through a scratch, cut, or wound, such as with surgery. All staph, including MRSA, can be spread from person-to-person by touching contaminated objects or through direct contact.  SPECIAL GROUPS  MRSA can present problems for special groups of people. Some of these groups include:   Breastfeeding women.   The most common problem is MRSA infection of the breast (mastitis). There is evidence that MRSA can be passed to an infant from infected breast milk. Your caregiver may recommend that you stop breastfeeding until the mastitis is under control.   If you are breastfeeding and have a MRSA infection in a place other than the breast, you may usually continue breastfeeding while under treatment. If taking antibiotics, ask your caregiver if it is safe to continue breastfeeding while taking your prescribed medicines.   Neonates (babies from birth to 1 month old) and infants (babies from 1 month to 1 year old).   There is evidence that MRSA can be passed to a newborn at birth if the mother has MRSA on the skin, in or around the birth canal, or an infection in the uterus, cervix, or vagina. MRSA infection can have the same appearance as a normal newborn or infant rash or several other skin infections. This can make it hard to diagnose MRSA.   Immune compromised people.   If you have an immune system problem, you may have a higher chance of developing a MRSA infection.   People after any type of surgery.   Staph in general, including MRSA, is the most common cause of infections occurring at the site of recent surgery.   People on long-term steroid  medicines.   These kinds of medicines can lower your resistance to infection. This can increase your chance of getting MRSA.   People who have had frequent hospitalizations, live in nursing homes or other residential care facilities, have venous or urinary catheters, or have taken multiple courses of antibiotic therapy for any reason.  DIAGNOSIS   Diagnosis of MRSA is done by cultures of fluid samples that may come from:   Swabs taken from cuts or wounds in infected areas.   Nasal swabs.   Saliva or deep cough specimens from the lungs (sputum).   Urine.   Blood.  Many people are "colonized" with MRSA but have no signs of infection. This means that people carry the MRSA germ on their skin or in their nose and may never develop MRSA infection.   TREATMENT   Treatment varies and is based on how serious, how deep, or how extensive the infection is. For example:   Some skin infections, such as a small boil   or abscess, may be treated by draining yellowish-white fluid (pus) from the site of the infection.   Deeper or more widespread soft tissue infections are usually treated with surgery to drain pus and with antibiotic medicine given by vein or by mouth. This may be recommended even if you are pregnant.   Serious infections may require a hospital stay.  If antibiotics are given, they may be needed for several weeks.  PREVENTION   Because many people are colonized with staph, including MRSA, preventing the spread of the bacteria from person-to-person is most important. The best way to prevent the spread of bacteria and other germs is through proper hand washing or by using alcohol-based hand disinfectants. The following are other ways to help prevent MRSA infection within the hospital and community settings.    Healthcare settings:   Strict hand washing or hand disinfection procedures need to be followed before and after touching every patient.   Patients infected with MRSA are placed in isolation to prevent  the spread of the bacteria.   Healthcare workers need to wear disposable gowns and gloves when touching or caring for patients infected with MRSA. Visitors may also be asked to wear a gown and gloves.   Hospital surfaces need to be disinfected frequently.   Community settings:   Wash your hands frequently with soap and water for at least 15 seconds. Otherwise, use alcohol-based hand disinfectants when soap and water is not available.   Make sure people who live with you wash their hands often, too.   Do not share personal items. For example, avoid sharing razors and other personal hygiene items, towels, clothing, and athletic equipment.   Wash and dry your clothes and bedding at the warmest temperatures recommended on the labels.   Keep wounds covered. Pus from infected sores may contain MRSA and other bacteria. Keep cuts and abrasions clean and covered with germ-free (sterile), dry bandages until they are healed.   If you have a wound that appears infected, ask your caregiver if a culture for MRSA and other bacteria should be done.   If you are breastfeeding, talk to your caregiver about MRSA. You may be asked to temporarily stop breastfeeding.  HOME CARE INSTRUCTIONS    Take your antibiotics as directed. Finish them even if you start to feel better.   Avoid close contact with those around you as much as possible. Do not use towels, razors, toothbrushes, bedding, or other items that will be used by others.   To fight the infection, follow your caregiver's instructions for wound care. Wash your hands before and after changing your bandages.   If you have an intravascular device, such as a catheter, make sure you know how to care for it.   Be sure to tell any healthcare providers that you have MRSA so they are aware of your infection.  SEEK IMMEDIATE MEDICAL CARE IF:    The infection appears to be getting worse. Signs include:   Increased warmth, redness, or tenderness around the wound site.   A red  line that extends from the infection site.   A dark color in the area around the infection.   Wound drainage that is tan, yellow, or green.   A bad smell coming from the wound.   You feel sick to your stomach (nauseous) and throw up (vomit) or cannot keep medicine down.   You have a fever.   Your baby is older than 3 months with a rectal temperature of 102   F (38.9 C) or higher.   Your baby is 3 months old or younger with a rectal temperature of 100.4 F (38 C) or higher.   You have difficulty breathing.  MAKE SURE YOU:    Understand these instructions.   Will watch your condition.   Will get help right away if you are not doing well or get worse.  Document Released: 11/08/2005 Document Revised: 01/31/2012 Document Reviewed: 02/10/2011  [employer] Patient Information 2014 [employer]

## 2014-02-03 NOTE — ED Provider Notes (Signed)
CSN: [id number]     Arrival date & time 02/02/14  2147 History   First MD Initiated Contact with Patient 02/02/14 2240     Chief Complaint  Patient presents with  . Abscess     (Consider location/radiation/quality/duration/timing/severity/associated sxs/prior Treatment) HPI Comments: Patient is 38 year old male with PMHx significant for recurrent MRSA infections and abscesses presents with 3 day history of left axillary abscess.  States started with small pimple - reports increase in pain and swelling - no drainage at this time - denies fever, chills, nausea, vomiting, headaches.  [name] presenting with abscess. The history is provided by the patient. No language interpreter was used.  Abscess Location:  Torso Torso abscess location:  L axilla Abscess quality: fluctuance, induration, painful and redness   Abscess quality: not draining and not weeping   Red streaking: no   Duration:  3 days Progression:  Worsening Pain details:    Quality:  Sharp and throbbing   Severity:  Moderate   Duration:  3 days   Timing:  Constant   Progression:  Worsening Chronicity:  Recurrent Relieved by:  Nothing Worsened by:  Nothing tried Ineffective treatments:  None tried Associated symptoms: no anorexia, no fatigue, no fever, no headaches, no nausea and no vomiting   Risk factors: hx of MRSA and prior abscess     Past Medical History  Diagnosis Date  . H/O back injury   . MRSA (methicillin resistant Staphylococcus aureus)    Past Surgical History  Procedure Laterality Date  . Skin biopsy     Family History  Problem Relation Age of Onset  . Cancer Father    History  Substance Use Topics  . Smoking status: Heavy Tobacco Smoker -- 2.00 packs/day    Types: Cigarettes  . Smokeless tobacco: Never Used  . Alcohol Use: No    Review of Systems  Constitutional: Negative for fever and fatigue.  Gastrointestinal: Negative for nausea, vomiting and anorexia.   Neurological: Negative for headaches.  All other systems reviewed and are negative.      Allergies  Vicodin and Adhesive  Home Medications   Current Outpatient Rx  Name  Route  Sig  Dispense  Refill  . oxyCODONE-acetaminophen (PERCOCET/ROXICET) 5-325 MG per tablet   Oral   Take 1-2 tablets by mouth every 6 (six) hours as needed (for pain).   20 tablet   0    BP 128/77  Pulse 98  Temp(Src) 98.8 F (37.1 C) (Oral)  Resp 14  Ht 6' (1.829 m)  Wt 200 lb (90.719 kg)  BMI 27.12 kg/m2  SpO2 99% Physical Exam  Nursing note and vitals reviewed. Constitutional: He is oriented to person, place, and time. He appears well-developed and well-nourished. No distress.  HENT:  Head: Normocephalic and atraumatic.  Mouth/Throat: Oropharynx is clear and moist.  Eyes: Conjunctivae are normal. No scleral icterus.  Pulmonary/Chest: Effort normal.  Musculoskeletal: Normal range of motion. He exhibits no edema and no tenderness.  Neurological: He is alert and oriented to person, place, and time. He exhibits normal muscle tone. Coordination normal.  Skin: Skin is warm and dry. No rash noted. There is erythema. No pallor.  2cm area of erythema and induration with fluctuance.  Psychiatric: He has a normal mood and affect. His behavior is normal. Judgment and thought content normal.    ED Course  Procedures (including critical care time) Labs Review Labs Reviewed - No data to display Imaging Review No results  found.   EKG Interpretation None     INCISION AND DRAINAGE Performed by: Cherrie DistanceSANFORD,Baylei Siebels C. Consent: Verbal consent obtained. Risks and benefits: risks, benefits and alternatives were discussed Type: abscess  Body area: left axilla  Anesthesia: local infiltration  Incision was made with a scalpel.  Local anesthetic: lidocaine 1% without epinephrine  Anesthetic total: 3 ml  Complexity: complex Blunt dissection to break up loculations  Drainage: purulent  Drainage  amount: large  Packing material: 1/4 in iodoform gauze  Patient tolerance: Patient tolerated the procedure well with no immediate complications.   MDM   Left axillary abscess  Patient here with left axillary abscess - history of same, I&D without difficulty.  No fever,chills, signs of systemic infection.    Izola PriceFrances C. Marisue HumbleSanford, PA-C 02/03/14 [phone]

## 2014-02-03 NOTE — ED Notes (Signed)
D/c home with ride- wound care discussed- Rx x 2 given at d/c for percocet and bactrim- Pt medicated for pain prior to d/c

## 2014-02-03 NOTE — ED Provider Notes (Signed)
Medical screening examination/treatment/procedure(s) were performed by non-physician practitioner and as supervising physician I was immediately available for consultation/collaboration.   EKG Interpretation None       [name] K Marcell Pfeifer-Rasch, MD 02/03/14 [phone]

## 2014-02-03 NOTE — ED Notes (Signed)
PA at bedside for I & D.

## 2014-12-11 ENCOUNTER — Emergency Department (HOSPITAL_BASED_OUTPATIENT_CLINIC_OR_DEPARTMENT_OTHER)
Admission: EM | Admit: 2014-12-11 | Discharge: 2014-12-11 | Disposition: A | Discharge status: Home / Self Care | Payer: Self-pay | Attending: Emergency Medicine | Admitting: Emergency Medicine

## 2014-12-11 ENCOUNTER — Encounter (HOSPITAL_BASED_OUTPATIENT_CLINIC_OR_DEPARTMENT_OTHER): Payer: Self-pay | Admitting: *Deleted

## 2014-12-11 DIAGNOSIS — Z720 Tobacco use: Secondary | ICD-10-CM | POA: Insufficient documentation

## 2014-12-11 DIAGNOSIS — L03113 Cellulitis of right upper limb: Secondary | ICD-10-CM | POA: Insufficient documentation

## 2014-12-11 DIAGNOSIS — Z8614 Personal history of Methicillin resistant Staphylococcus aureus infection: Secondary | ICD-10-CM | POA: Insufficient documentation

## 2014-12-11 DIAGNOSIS — Z792 Long term (current) use of antibiotics: Secondary | ICD-10-CM | POA: Insufficient documentation

## 2014-12-11 DIAGNOSIS — Z79899 Other long term (current) drug therapy: Secondary | ICD-10-CM | POA: Insufficient documentation

## 2014-12-11 DIAGNOSIS — Z87828 Personal history of other (healed) physical injury and trauma: Secondary | ICD-10-CM | POA: Insufficient documentation

## 2014-12-11 DIAGNOSIS — G5601 Carpal tunnel syndrome, right upper limb: Secondary | ICD-10-CM | POA: Insufficient documentation

## 2014-12-11 MED ORDER — OXYCODONE-ACETAMINOPHEN 5-325 MG PO TABS: 1.0000 | ORAL_TABLET | Freq: Four times a day (QID) | ORAL | Status: DC | PRN

## 2014-12-11 MED ORDER — CEPHALEXIN 500 MG PO CAPS: 500.0000 mg | ORAL_CAPSULE | Freq: Four times a day (QID) | ORAL | Status: DC

## 2014-12-11 NOTE — ED Notes (Signed)
Pt c/o right hand swelling and redness x 1 day denies injury

## 2014-12-11 NOTE — ED Provider Notes (Signed)
CSN: [id number]     Arrival date & time 12/11/14  1539 History   First MD Initiated Contact with Patient 12/11/14 1550     Chief Complaint  Patient presents with  . Hand Pain     (Consider location/radiation/quality/duration/timing/severity/associated sxs/prior Treatment) HPI   PCP: Pcp Not In System Blood pressure 130/85, pulse 90, temperature 98 F (36.7 C), temperature source Oral, resp. rate 16, height 6' (1.829 m), weight 200 lb (90.719 kg), SpO2 100 %.  [name] is a 39 y.o.male with a significant PMH of MRSA, h/o back injury presents to the ER with complaints of right wrist pain and hand redness. He reports needing to use his hand for same repetitive motion recently for work. After a long day of work he came home and his wrist has been significantly hurting since then. He does not want to use ROM. He has been placing heat on it extensively for pain control because ibuprofen and tylenol have not been working. He denies any acute injury to the wrist. The redness does not overlay the area of pain. The redness is hot but not tender to the touch. He has not had any fevers, nausea, vomiting, diarrhea, weakness. Denies hx of pain to the right wrist.    Past Medical History  Diagnosis Date  . H/O back injury   . MRSA (methicillin resistant Staphylococcus aureus)    Past Surgical History  Procedure Laterality Date  . Skin biopsy     Family History  Problem Relation Age of Onset  . Cancer Father    History  Substance Use Topics  . Smoking status: Heavy Tobacco Smoker -- 2.00 packs/day    Types: Cigarettes  . Smokeless tobacco: Never Used  . Alcohol Use: No    Review of Systems  10 Systems reviewed and are negative for acute change except as noted in the HPI.   Allergies  Vicodin and Adhesive  Home Medications   Prior to Admission medications   Medication Sig Start Date End Date Taking? Authorizing Provider  cephALEXin (KEFLEX) 500 MG capsule Take 1 capsule (500  mg total) by mouth 4 (four) times daily. 12/11/14   Dorthula Matas, PA-C  oxyCODONE-acetaminophen (PERCOCET/ROXICET) 5-325 MG per tablet Take 1-2 tablets by mouth every 6 (six) hours as needed (for pain). 11/01/13   Carlisle Beers Molpus, MD  oxyCODONE-acetaminophen (PERCOCET/ROXICET) 5-325 MG per tablet Take 1 tablet by mouth every 4 (four) hours as needed for severe pain. 02/03/14   Izola Price. Sanford, PA-C  oxyCODONE-acetaminophen (PERCOCET/ROXICET) 5-325 MG per tablet Take 1 tablet by mouth every 6 (six) hours as needed for severe pain. 12/11/14   Hildegard Hlavac Irine Seal, PA-C  sulfamethoxazole-trimethoprim (BACTRIM DS) 800-160 MG per tablet Take 1 tablet by mouth 2 (two) times daily. 02/03/14   Izola Price. Sanford, PA-C   BP 130/85 mmHg  Pulse 90  Temp(Src) 98 F (36.7 C) (Oral)  Resp 16  Ht 6' (1.829 m)  Wt 200 lb (90.719 kg)  BMI 27.12 kg/m2  SpO2 100% Physical Exam  Constitutional: He appears well-developed and well-nourished. No distress.  HENT:  Head: Normocephalic and atraumatic.  Eyes: Pupils are equal, round, and reactive to light.  Neck: Normal range of motion. Neck supple.  Cardiovascular: Normal rate and regular rhythm.   Pulmonary/Chest: Effort normal.  Abdominal: Soft.  Musculoskeletal:       Right wrist: He exhibits decreased range of motion (due to pain), tenderness and bony tenderness. He exhibits no swelling, no effusion, no  crepitus, no deformity and no laceration.  Pt has 4 x 3 inch area of induration and erythema to posterior palm. No tenderness to palpation or swelling associated.  He has pain the anterior and posterior wrist. CR < 3 seconds in all 5 fingers. Strong radial pulses present. No area of fluctuance, crepitus, or wound formation.  Neurological: He is alert.  Skin: Skin is warm and dry.  Nursing note and vitals reviewed.   ED Course  Procedures (including critical care time) Labs Review Labs Reviewed - No data to display  Imaging Review No results found.    EKG Interpretation None      MDM   Final diagnoses:  Cellulitis of right upper extremity  Carpal tunnel syndrome of right wrist    Wrist splint applied in ED Referral to HAND given for follow-up Rx Abx and pain medications for superficial skin infection and carpal tunnel symptoms.  39 y.o.Adrian LavPaul B Walker's evaluation in the Emergency Department is complete. It has been determined that no acute conditions requiring further emergency intervention are present at this time. The patient/guardian have been advised of the diagnosis and plan. We have discussed signs and symptoms that warrant return to the ED, such as changes or worsening in symptoms.  Vital signs are stable at discharge. Filed Vitals:   12/11/14 1545  BP: 130/85  Pulse: 90  Temp: 98 F (36.7 C)  Resp: 16    Patient/guardian has voiced understanding and agreed to follow-up with the PCP or specialist.     Dorthula Matasiffany G Jehu Mccauslin, PA-C 12/11/14 1750  Linwood DibblesJon Knapp, MD 12/12/14 1531

## 2014-12-11 NOTE — Discharge Instructions (Signed)

## 2015-04-23 DIAGNOSIS — R7881 Bacteremia: Secondary | ICD-10-CM

## 2015-04-23 DIAGNOSIS — K6812 Psoas muscle abscess: Secondary | ICD-10-CM

## 2015-04-23 HISTORY — DX: Psoas muscle abscess: K68.12

## 2015-04-23 HISTORY — DX: Bacteremia: R78.81

## 2015-05-04 ENCOUNTER — Encounter (HOSPITAL_BASED_OUTPATIENT_CLINIC_OR_DEPARTMENT_OTHER): Payer: Self-pay

## 2015-05-04 ENCOUNTER — Emergency Department (HOSPITAL_BASED_OUTPATIENT_CLINIC_OR_DEPARTMENT_OTHER)
Admission: EM | Admit: 2015-05-04 | Discharge: 2015-05-04 | Disposition: A | Discharge status: Home / Self Care | Payer: Medicaid Other | Attending: Emergency Medicine | Admitting: Emergency Medicine

## 2015-05-04 DIAGNOSIS — R2689 Other abnormalities of gait and mobility: Secondary | ICD-10-CM | POA: Insufficient documentation

## 2015-05-04 DIAGNOSIS — Z720 Tobacco use: Secondary | ICD-10-CM | POA: Diagnosis not present

## 2015-05-04 DIAGNOSIS — Z8614 Personal history of Methicillin resistant Staphylococcus aureus infection: Secondary | ICD-10-CM | POA: Diagnosis not present

## 2015-05-04 DIAGNOSIS — M5432 Sciatica, left side: Secondary | ICD-10-CM

## 2015-05-04 DIAGNOSIS — Z87828 Personal history of other (healed) physical injury and trauma: Secondary | ICD-10-CM | POA: Diagnosis not present

## 2015-05-04 DIAGNOSIS — M25552 Pain in left hip: Secondary | ICD-10-CM | POA: Diagnosis present

## 2015-05-04 MED ORDER — MELOXICAM 7.5 MG PO TABS: 7.5000 mg | ORAL_TABLET | Freq: Every day | ORAL | Status: DC

## 2015-05-04 MED ORDER — CYCLOBENZAPRINE HCL 5 MG PO TABS: 5.0000 mg | ORAL_TABLET | Freq: Three times a day (TID) | ORAL | Status: DC | PRN

## 2015-05-04 MED ORDER — KETOROLAC TROMETHAMINE 30 MG/ML IJ SOLN
30.0000 mg | Freq: Once | INTRAMUSCULAR | Status: AC
  Administered 2015-05-04: 30 mg via INTRAVENOUS
  Filled 2015-05-04: qty 1

## 2015-05-04 NOTE — ED Notes (Addendum)
Patient reports increasing left hip and buttocks pain after [place] ride [place], no bruising, no obvious deformity. Reports that the pain is becoming severe and unable to ambulate

## 2015-05-04 NOTE — ED Provider Notes (Signed)
CSN: [id number]     Arrival date & time 05/04/15  1622 History   First MD Initiated Contact with Patient 05/04/15 1700     Chief Complaint  Patient presents with  . Hip Pain     (Consider location/radiation/quality/duration/timing/severity/associated sxs/prior Treatment) Patient is a 39 y.o. male presenting with hip pain. The history is provided by the patient.  Hip Pain This is a new problem. The current episode started in the past 7 days. The problem occurs constantly. The problem has been gradually worsening. Pertinent negatives include no chest pain, chills, coughing, fever, nausea, numbness, rash, vomiting or weakness. The symptoms are aggravated by walking and bending. He has tried nothing for the symptoms.     [name] is a 40 yo M presenting with pain in his left buttock and leg.  The pain started on Thursday after [place]. The pain has gotten worse and is throbbing in nature. Rated as 10/10 and worse with movements and valsalva maneuvers.  He denies any weakness or numbness his his left lower leg. Denies any prior injury to his back, hip or leg. No bowel or bladder incontinence. No IV drug use and no fever, nausea, vomiting, constipation or diarrhea.    Past Medical History  Diagnosis Date  . H/O back injury   . MRSA (methicillin resistant Staphylococcus aureus)    Past Surgical History  Procedure Laterality Date  . Skin biopsy     Family History  Problem Relation Age of Onset  . Cancer Father    History  Substance Use Topics  . Smoking status: Heavy Tobacco Smoker -- 2.00 packs/day    Types: Cigarettes  . Smokeless tobacco: Never Used  . Alcohol Use: No    Review of Systems  Constitutional: Negative for fever and chills.  Respiratory: Negative for cough.   Cardiovascular: Negative for chest pain.  Gastrointestinal: Negative for nausea, vomiting, diarrhea and constipation.  Musculoskeletal: Positive for gait  problem.  Skin: Negative for rash.  Neurological: Negative for weakness and numbness.      Allergies  Vicodin and Adhesive  Home Medications   Prior to Admission medications   Medication Sig Start Date End Date Taking? Authorizing Provider  cyclobenzaprine (FLEXERIL) 5 MG tablet Take 1 tablet (5 mg total) by mouth 3 (three) times daily as needed for muscle spasms. 05/04/15   Myra Rude, MD  meloxicam (MOBIC) 7.5 MG tablet Take 1 tablet (7.5 mg total) by mouth daily. 05/04/15   Myra Rude, MD   BP 124/73 mmHg  Pulse 79  Temp(Src) 97.9 F (36.6 C) (Oral)  Resp 18  Ht 6' (1.829 m)  Wt 200 lb (90.719 kg)  BMI 27.12 kg/m2  SpO2 98% Physical Exam  Constitutional: He is oriented to person, place, and time. He appears well-developed and well-nourished.  HENT:  Head: Normocephalic and atraumatic.  Eyes: Conjunctivae and EOM are normal.  Neck: Normal range of motion.  Cardiovascular: Normal rate, regular rhythm and normal heart sounds.   No murmur heard. Pulmonary/Chest: Effort normal and breath sounds normal. He has no wheezes. He has no rales.  Abdominal: Soft. Bowel sounds are normal.  Musculoskeletal:  Normal plantar and dorsi flexion  Normal sensation in LE b/l  Neurovascularly intact  Positive straight leg test   Neurological: He is alert and oriented to person, place, and time.  Skin: Skin is warm. No rash noted.    ED Course  Procedures (including  critical care time) Labs Review Labs Reviewed - No data to display  Imaging Review No results found.   EKG Interpretation None      MDM   Final diagnoses:  Sciatica, left   [name] is p/w with left sided buttock pain that radiates down his posterior thigh most c/w with radiculopathy. Will give Toradol 30 mg IM.   Patient was sleeping upon re-examination. Will provide mobic and flexeril. Advised to f/u with PCP if pain continues. Note given to keep out of work until Thursday. Patient agreeable with  plan and discharge.    Myra Rude, MD PGY-2, [facility] 05/04/2015, 6:57 PM    Myra Rude, MD 05/04/15 1857  Nelva Nay, MD 05/04/15 Windell Moment

## 2015-05-04 NOTE — Discharge Instructions (Signed)

## 2015-05-05 ENCOUNTER — Emergency Department (HOSPITAL_BASED_OUTPATIENT_CLINIC_OR_DEPARTMENT_OTHER)
Admission: EM | Admit: 2015-05-05 | Discharge: 2015-05-05 | Disposition: A | Discharge status: Home / Self Care | Payer: Medicaid Other | Attending: Emergency Medicine | Admitting: Emergency Medicine

## 2015-05-05 ENCOUNTER — Encounter (HOSPITAL_BASED_OUTPATIENT_CLINIC_OR_DEPARTMENT_OTHER): Payer: Self-pay | Admitting: Emergency Medicine

## 2015-05-05 DIAGNOSIS — M545 Low back pain: Secondary | ICD-10-CM | POA: Diagnosis present

## 2015-05-05 DIAGNOSIS — Z8614 Personal history of Methicillin resistant Staphylococcus aureus infection: Secondary | ICD-10-CM | POA: Diagnosis not present

## 2015-05-05 DIAGNOSIS — Z720 Tobacco use: Secondary | ICD-10-CM | POA: Diagnosis not present

## 2015-05-05 DIAGNOSIS — M5432 Sciatica, left side: Secondary | ICD-10-CM

## 2015-05-05 DIAGNOSIS — Z87828 Personal history of other (healed) physical injury and trauma: Secondary | ICD-10-CM | POA: Insufficient documentation

## 2015-05-05 DIAGNOSIS — Z791 Long term (current) use of non-steroidal anti-inflammatories (NSAID): Secondary | ICD-10-CM | POA: Insufficient documentation

## 2015-05-05 MED ORDER — PREDNISONE 10 MG PO TABS: 20.0000 mg | ORAL_TABLET | Freq: Two times a day (BID) | ORAL | Status: DC

## 2015-05-05 MED ORDER — DEXAMETHASONE SODIUM PHOSPHATE 10 MG/ML IJ SOLN
INTRAMUSCULAR | Status: DC
Start: 2015-05-05 — End: 2015-05-06
  Filled 2015-05-05: qty 1

## 2015-05-05 MED ORDER — KETOROLAC TROMETHAMINE 60 MG/2ML IM SOLN
60.0000 mg | Freq: Once | INTRAMUSCULAR | Status: AC
  Administered 2015-05-05: 60 mg via INTRAMUSCULAR
  Filled 2015-05-05: qty 2

## 2015-05-05 MED ORDER — DEXAMETHASONE SODIUM PHOSPHATE 10 MG/ML IJ SOLN
10.0000 mg | Freq: Once | INTRAMUSCULAR | Status: AC
Start: 2015-05-05 — End: 2015-05-05
  Administered 2015-05-05: 10 mg via INTRAMUSCULAR

## 2015-05-05 NOTE — ED Notes (Addendum)
Patient yelling during triage "I need to see a doctor now, put me in a room".  Explained to patient that I could not put him in a room at this time and I understand that he is hurting and that we will get him to a room as soon as possible.  Patient states "no you don't understand".  Told patient that I was sorry he was in so much pain but that we do have a wait a present and I cannot put him in a room at the present time.  Patient placed in lobby in wheelchair. Significant other with patient.

## 2015-05-05 NOTE — ED Notes (Signed)
Patient reports back pain which began approximately 4 days ago. Patient was seen here last night.

## 2015-05-05 NOTE — ED Notes (Signed)
MD at bedside. 

## 2015-05-05 NOTE — ED Provider Notes (Signed)
CSN: [id number]     Arrival date & time 05/05/15  2115 History  This chart was scribed for Geoffery Lyons, MD by Lyndel Safe, ED Scribe. This patient was seen in room MHT13/MHT13 and the patient's care was started 10:07 PM.   Chief Complaint  Patient presents with  . Back Pain   The history is provided by the patient. No language interpreter was used.    HPI Comments: [name] is a 39 y.o. male, with a PMhx of back pain and hip pain, who presents to the Emergency Department complaining of constant, moderate lower back pain that radiates down his left leg. The pt was seen here at [facility] yesterday for hip pain and given toradol 30 mg in the ED and prescribed meloxicam and flexeril. He denies fever, chills, bowel or bladder incontinence, difficulty ambulating, or any recent injury/trauma.  Past Medical History  Diagnosis Date  . H/O back injury   . MRSA (methicillin resistant Staphylococcus aureus)    Past Surgical History  Procedure Laterality Date  . Skin biopsy     Family History  Problem Relation Age of Onset  . Cancer Father    History  Substance Use Topics  . Smoking status: Heavy Tobacco Smoker -- 2.00 packs/day    Types: Cigarettes  . Smokeless tobacco: Never Used  . Alcohol Use: No    Review of Systems  Constitutional: Negative for fever and chills.  Musculoskeletal: Positive for back pain and arthralgias. Negative for gait problem.  A complete 10 system review of systems was obtained and is otherwise negative except at noted in the HPI and PMH. Allergies  Vicodin and Adhesive  Home Medications   Prior to Admission medications   Medication Sig Start Date End Date Taking? Authorizing Provider  cyclobenzaprine (FLEXERIL) 5 MG tablet Take 1 tablet (5 mg total) by mouth 3 (three) times daily as needed for muscle spasms. 05/04/15   Myra Rude, MD  meloxicam (MOBIC) 7.5 MG tablet Take 1 tablet (7.5 mg total) by mouth daily. 05/04/15   Myra Rude, MD   BP  142/81 mmHg  Pulse 103  Temp(Src) 98.6 F (37 C) (Oral)  Resp 18  Ht 6' (1.829 m)  Wt 200 lb (90.719 kg)  BMI 27.12 kg/m2  SpO2 97% Physical Exam  Constitutional: He is oriented to person, place, and time. He appears well-developed and well-nourished. No distress.  Pt appears uncomfortable and is very dramatic.   HENT:  Head: Normocephalic.  Right Ear: External ear normal.  Left Ear: External ear normal.  Mouth/Throat: No oropharyngeal exudate.  Eyes: Pupils are equal, round, and reactive to light. Right eye exhibits no discharge. Left eye exhibits no discharge. No scleral icterus.  Neck: No JVD present.  Cardiovascular: Normal rate, regular rhythm and normal heart sounds.   Pulmonary/Chest: Effort normal and breath sounds normal. No respiratory distress.  Musculoskeletal: He exhibits no edema.  There is TTP in the left buttock.  Lymphadenopathy:    He has no cervical adenopathy.  Neurological: He is alert and oriented to person, place, and time.  Patellar and achilles DTRs are 2+ and symmetrical. Strength 5/5 in BLE. He is ambulatory, however he limps secondary to pain.   Skin: Skin is warm. No rash noted. No erythema. No pallor.  Psychiatric: He has a normal mood and affect. His behavior is normal.  Nursing note and vitals reviewed.  ED Course  Procedures  DIAGNOSTIC STUDIES: Oxygen Saturation is 97% on RA, normal by my  interpretation.    COORDINATION OF CARE: 10:14 PM Discussed treatment plan with pt. Pt acknowledges and agrees to plan.   Labs Review Labs Reviewed - No data to display  Imaging Review No results found.   EKG Interpretation None      MDM   Final diagnoses:  None    Patient presents here with complaints of severe left lower back pain he states is radiating into his leg. He was seen yesterday for similar complaint. Patient has been very dramatic and demanding from the time he arrived. His physical examination reveals symmetrical reflexes and  no deficits in strength. He is not endorsing any bowel or bladder issues and I do not feel as though there is an emergently surgical issue. He was given IM Decadron and Toradol and now feels better. He will be discharged with prednisone in addition to the medications he was prescribed yesterday.  I personally performed the services described in this documentation, which was scribed in my presence. The recorded information has been reviewed and is accurate.      Geoffery Lyons, MD 05/05/15 2308

## 2015-05-05 NOTE — Discharge Instructions (Signed)
Prednisone as prescribed.  Continue your medications as before.   Sciatica Sciatica is pain, weakness, numbness, or tingling along the path of the sciatic nerve. The nerve starts in the lower back and runs down the back of each leg. The nerve controls the muscles in the lower leg and in the back of the knee, while also providing sensation to the back of the thigh, lower leg, and the sole of your foot. Sciatica is a symptom of another medical condition. For instance, nerve damage or certain conditions, such as a herniated disk or bone spur on the spine, pinch or put pressure on the sciatic nerve. This causes the pain, weakness, or other sensations normally associated with sciatica. Generally, sciatica only affects one side of the body. CAUSES   Herniated or slipped disc.  Degenerative disk disease.  A pain disorder involving the narrow muscle in the buttocks (piriformis syndrome).  Pelvic injury or fracture.  Pregnancy.  Tumor (rare). SYMPTOMS  Symptoms can vary from mild to very severe. The symptoms usually travel from the low back to the buttocks and down the back of the leg. Symptoms can include:  Mild tingling or dull aches in the lower back, leg, or hip.  Numbness in the back of the calf or sole of the foot.  Burning sensations in the lower back, leg, or hip.  Sharp pains in the lower back, leg, or hip.  Leg weakness.  Severe back pain inhibiting movement. These symptoms may get worse with coughing, sneezing, laughing, or prolonged sitting or standing. Also, being overweight may worsen symptoms. DIAGNOSIS  Your caregiver will perform a physical exam to look for common symptoms of sciatica. He or she may ask you to do certain movements or activities that would trigger sciatic nerve pain. Other tests may be performed to find the cause of the sciatica. These may include:  Blood tests.  X-rays.  Imaging tests, such as an MRI or CT scan. TREATMENT  Treatment is directed at  the cause of the sciatic pain. Sometimes, treatment is not necessary and the pain and discomfort goes away on its own. If treatment is needed, your caregiver may suggest:  Over-the-counter medicines to relieve pain.  Prescription medicines, such as anti-inflammatory medicine, muscle relaxants, or narcotics.  Applying heat or ice to the painful area.  Steroid injections to lessen pain, irritation, and inflammation around the nerve.  Reducing activity during periods of pain.  Exercising and stretching to strengthen your abdomen and improve flexibility of your spine. Your caregiver may suggest losing weight if the extra weight makes the back pain worse.  Physical therapy.  Surgery to eliminate what is pressing or pinching the nerve, such as a bone spur or part of a herniated disk. HOME CARE INSTRUCTIONS   Only take over-the-counter or prescription medicines for pain or discomfort as directed by your caregiver.  Apply ice to the affected area for 20 minutes, 3-4 times a day for the first 48-72 hours. Then try heat in the same way.  Exercise, stretch, or perform your usual activities if these do not aggravate your pain.  Attend physical therapy sessions as directed by your caregiver.  Keep all follow-up appointments as directed by your caregiver.  Do not wear high heels or shoes that do not provide proper support.  Check your mattress to see if it is too soft. A firm mattress may lessen your pain and discomfort. SEEK IMMEDIATE MEDICAL CARE IF:   You lose control of your bowel or bladder (incontinence).  You have increasing weakness in the lower back, pelvis, buttocks, or legs.  You have redness or swelling of your back.  You have a burning sensation when you urinate.  You have pain that gets worse when you lie down or awakens you at night.  Your pain is worse than you have experienced in the past.  Your pain is lasting longer than 4 weeks.  You are suddenly losing weight  without reason. MAKE SURE YOU:  Understand these instructions.  Will watch your condition.  Will get help right away if you are not doing well or get worse. Document Released: 11/02/2001 Document Revised: 05/09/2012 Document Reviewed: 03/19/2012 [facility] Patient Information 2015 [place], [place]. This information is not intended to replace advice given to you by your health care provider. Make sure you discuss any questions you have with your health care provider.

## 2015-05-15 ENCOUNTER — Emergency Department (HOSPITAL_COMMUNITY): Payer: Medicaid Other

## 2015-05-15 ENCOUNTER — Encounter (HOSPITAL_COMMUNITY): Payer: Self-pay | Admitting: Emergency Medicine

## 2015-05-15 ENCOUNTER — Inpatient Hospital Stay (HOSPITAL_COMMUNITY)
Admission: EM | Admit: 2015-05-15 | Discharge: 2015-05-23 | DRG: 372 | Disposition: A | Discharge status: Home / Self Care | Payer: Medicaid Other | Attending: Internal Medicine | Admitting: Internal Medicine

## 2015-05-15 DIAGNOSIS — R233 Spontaneous ecchymoses: Secondary | ICD-10-CM | POA: Diagnosis present

## 2015-05-15 DIAGNOSIS — D72829 Elevated white blood cell count, unspecified: Secondary | ICD-10-CM

## 2015-05-15 DIAGNOSIS — Z23 Encounter for immunization: Secondary | ICD-10-CM | POA: Diagnosis not present

## 2015-05-15 DIAGNOSIS — D649 Anemia, unspecified: Secondary | ICD-10-CM | POA: Diagnosis present

## 2015-05-15 DIAGNOSIS — K6812 Psoas muscle abscess: Principal | ICD-10-CM | POA: Diagnosis present

## 2015-05-15 DIAGNOSIS — R102 Pelvic and perineal pain: Secondary | ICD-10-CM

## 2015-05-15 DIAGNOSIS — O26899 Other specified pregnancy related conditions, unspecified trimester: Secondary | ICD-10-CM

## 2015-05-15 DIAGNOSIS — Z885 Allergy status to narcotic agent status: Secondary | ICD-10-CM | POA: Diagnosis not present

## 2015-05-15 DIAGNOSIS — F17210 Nicotine dependence, cigarettes, uncomplicated: Secondary | ICD-10-CM | POA: Diagnosis present

## 2015-05-15 DIAGNOSIS — R21 Rash and other nonspecific skin eruption: Secondary | ICD-10-CM | POA: Diagnosis present

## 2015-05-15 DIAGNOSIS — L0291 Cutaneous abscess, unspecified: Secondary | ICD-10-CM

## 2015-05-15 DIAGNOSIS — E871 Hypo-osmolality and hyponatremia: Secondary | ICD-10-CM

## 2015-05-15 DIAGNOSIS — K5900 Constipation, unspecified: Secondary | ICD-10-CM | POA: Diagnosis present

## 2015-05-15 DIAGNOSIS — M79606 Pain in leg, unspecified: Secondary | ICD-10-CM | POA: Diagnosis present

## 2015-05-15 DIAGNOSIS — Z79899 Other long term (current) drug therapy: Secondary | ICD-10-CM

## 2015-05-15 DIAGNOSIS — R7881 Bacteremia: Secondary | ICD-10-CM | POA: Insufficient documentation

## 2015-05-15 DIAGNOSIS — Z91048 Other nonmedicinal substance allergy status: Secondary | ICD-10-CM | POA: Diagnosis not present

## 2015-05-15 DIAGNOSIS — M5430 Sciatica, unspecified side: Secondary | ICD-10-CM

## 2015-05-15 DIAGNOSIS — M7989 Other specified soft tissue disorders: Secondary | ICD-10-CM

## 2015-05-15 LAB — CBC WITH DIFFERENTIAL/PLATELET
BASOS PCT: 0 % (ref 0–1)
Basophils Absolute: 0 10*3/uL (ref 0.0–0.1)
EOS PCT: 0 % (ref 0–5)
Eosinophils Absolute: 0 10*3/uL (ref 0.0–0.7)
HEMATOCRIT: 35.1 % — AB (ref 39.0–52.0)
HEMOGLOBIN: 11.8 g/dL — AB (ref 13.0–17.0)
LYMPHS PCT: 7 % — AB (ref 12–46)
Lymphs Abs: 1.8 10*3/uL (ref 0.7–4.0)
MCH: 27.8 pg (ref 26.0–34.0)
MCHC: 33.6 g/dL (ref 30.0–36.0)
MCV: 82.6 fL (ref 78.0–100.0)
MONOS PCT: 7 % (ref 3–12)
Monocytes Absolute: 1.8 10*3/uL — ABNORMAL HIGH (ref 0.1–1.0)
NEUTROS ABS: 22.7 10*3/uL — AB (ref 1.7–7.7)
Neutrophils Relative %: 86 % — ABNORMAL HIGH (ref 43–77)
Platelets: 508 10*3/uL — ABNORMAL HIGH (ref 150–400)
RBC: 4.25 MIL/uL (ref 4.22–5.81)
RDW: 14.7 % (ref 11.5–15.5)
WBC: 26.3 10*3/uL — AB (ref 4.0–10.5)

## 2015-05-15 LAB — COMPREHENSIVE METABOLIC PANEL
ALBUMIN: 2.3 g/dL — AB (ref 3.5–5.0)
ALK PHOS: 381 U/L — AB (ref 38–126)
ALT: 174 U/L — ABNORMAL HIGH (ref 17–63)
AST: 69 U/L — ABNORMAL HIGH (ref 15–41)
Anion gap: 10 (ref 5–15)
BUN: 15 mg/dL (ref 6–20)
CO2: 27 mmol/L (ref 22–32)
Calcium: 8.5 mg/dL — ABNORMAL LOW (ref 8.9–10.3)
Chloride: 92 mmol/L — ABNORMAL LOW (ref 101–111)
Creatinine, Ser: 0.71 mg/dL (ref 0.61–1.24)
GFR calc Af Amer: >60 mL/min (ref >60)
GFR calc non Af Amer: >60 mL/min (ref >60)
Glucose, Bld: 113 mg/dL — ABNORMAL HIGH (ref 65–99)
POTASSIUM: 4.5 mmol/L (ref 3.5–5.1)
SODIUM: 129 mmol/L — AB (ref 135–145)
TOTAL PROTEIN: 7.6 g/dL (ref 6.5–8.1)
Total Bilirubin: 1.2 mg/dL (ref 0.3–1.2)

## 2015-05-15 LAB — URINE MICROSCOPIC-ADD ON

## 2015-05-15 LAB — URINALYSIS, ROUTINE W REFLEX MICROSCOPIC
Bilirubin Urine: NEGATIVE
GLUCOSE, UA: NEGATIVE mg/dL
KETONES UR: NEGATIVE mg/dL
LEUKOCYTES UA: NEGATIVE
Nitrite: NEGATIVE
Protein, ur: 30 mg/dL — AB
Specific Gravity, Urine: 1.025 (ref 1.005–1.030)
UROBILINOGEN UA: 4 mg/dL — AB (ref 0.0–1.0)
pH: 5.5 (ref 5.0–8.0)

## 2015-05-15 LAB — PROTIME-INR
INR: 1.33 (ref 0.00–1.49)
PROTHROMBIN TIME: 16.6 s — AB (ref 11.6–15.2)

## 2015-05-15 LAB — SEDIMENTATION RATE: Sed Rate: 83 mm/hr — ABNORMAL HIGH (ref 0–16)

## 2015-05-15 LAB — I-STAT CG4 LACTIC ACID, ED
LACTIC ACID, VENOUS: 1.06 mmol/L (ref 0.5–2.0)
LACTIC ACID, VENOUS: 1.15 mmol/L (ref 0.5–2.0)

## 2015-05-15 LAB — LIPASE, BLOOD: LIPASE: 18 U/L — AB (ref 22–51)

## 2015-05-15 MED ORDER — IOHEXOL 300 MG/ML  SOLN
25.0000 mL | Freq: Once | INTRAMUSCULAR | Status: AC | PRN
  Administered 2015-05-15: 25 mL via ORAL

## 2015-05-15 MED ORDER — KETOROLAC TROMETHAMINE 30 MG/ML IJ SOLN
30.0000 mg | Freq: Once | INTRAMUSCULAR | Status: AC
  Administered 2015-05-15: 30 mg via INTRAVENOUS
  Filled 2015-05-15: qty 1

## 2015-05-15 MED ORDER — PIPERACILLIN-TAZOBACTAM 3.375 G IVPB
3.3750 g | Freq: Three times a day (TID) | INTRAVENOUS | Status: DC
  Administered 2015-05-16 (×2): 3.375 g via INTRAVENOUS
  Filled 2015-05-15 (×5): qty 50

## 2015-05-15 MED ORDER — SODIUM CHLORIDE 0.9 % IV BOLUS (SEPSIS)
1000.0000 mL | Freq: Once | INTRAVENOUS | Status: AC
  Administered 2015-05-15: 1000 mL via INTRAVENOUS

## 2015-05-15 MED ORDER — FENTANYL CITRATE (PF) 100 MCG/2ML IJ SOLN
50.0000 ug | Freq: Once | INTRAMUSCULAR | Status: AC
  Administered 2015-05-15: 50 ug via INTRAVENOUS
  Filled 2015-05-15: qty 2

## 2015-05-15 MED ORDER — SODIUM CHLORIDE 0.9 % IV SOLN
INTRAVENOUS | Status: DC
  Administered 2015-05-15 – 2015-05-18 (×4): via INTRAVENOUS

## 2015-05-15 MED ORDER — MORPHINE SULFATE 4 MG/ML IJ SOLN
4.0000 mg | INTRAMUSCULAR | Status: DC | PRN
  Administered 2015-05-15 – 2015-05-16 (×3): 4 mg via INTRAVENOUS
  Filled 2015-05-15 (×3): qty 1

## 2015-05-15 MED ORDER — ONDANSETRON HCL 4 MG/2ML IJ SOLN: 4.0000 mg | Freq: Four times a day (QID) | INTRAMUSCULAR | Status: DC | PRN

## 2015-05-15 MED ORDER — PIPERACILLIN-TAZOBACTAM 3.375 G IVPB 30 MIN
3.3750 g | Freq: Once | INTRAVENOUS | Status: AC
  Administered 2015-05-15: 3.375 g via INTRAVENOUS
  Filled 2015-05-15: qty 50

## 2015-05-15 MED ORDER — VANCOMYCIN HCL IN DEXTROSE 1-5 GM/200ML-% IV SOLN
1000.0000 mg | Freq: Three times a day (TID) | INTRAVENOUS | Status: DC
  Administered 2015-05-16 – 2015-05-19 (×10): 1000 mg via INTRAVENOUS
  Filled 2015-05-15 (×13): qty 200

## 2015-05-15 MED ORDER — IOHEXOL 300 MG/ML  SOLN
100.0000 mL | Freq: Once | INTRAMUSCULAR | Status: AC | PRN
  Administered 2015-05-15: 100 mL via INTRAVENOUS

## 2015-05-15 MED ORDER — ONDANSETRON HCL 4 MG PO TABS: 4.0000 mg | ORAL_TABLET | Freq: Four times a day (QID) | ORAL | Status: DC | PRN

## 2015-05-15 MED ORDER — ONDANSETRON HCL 4 MG/2ML IJ SOLN
4.0000 mg | Freq: Once | INTRAMUSCULAR | Status: AC
  Administered 2015-05-15: 4 mg via INTRAVENOUS
  Filled 2015-05-15: qty 2

## 2015-05-15 MED ORDER — VANCOMYCIN HCL IN DEXTROSE 1-5 GM/200ML-% IV SOLN
1000.0000 mg | INTRAVENOUS | Status: AC
  Administered 2015-05-15: 1000 mg via INTRAVENOUS
  Filled 2015-05-15: qty 200

## 2015-05-15 NOTE — ED Notes (Signed)
Pt has multiple complaints. Pt c/o swelling and rash to bilateral feet. Pt also c/o back pain from sciatic nerve damage. Pt c/o trouble eating-causes pain in lower abdomen. Denies n/v. Some diarrhea. Pt also c/o dry mouth and states "I feel like my body is shutting down".

## 2015-05-15 NOTE — H&P (Addendum)
[facility] History and Physical  [name] ZOX:[id number] DOB: 07-29-1976 DOA: 05/15/2015  Referring physician: ER PCP: Pcp Not In System   Chief Complaint: Left back and leg pain.  HPI: [name] is a 39 y.o. male  This is a 39 year old man who has had onset of left back and leg pain for approximately the last couple weeks. Patient was seen in the emergency room in [facility] 12 and 13 days ago and was treated with steroids. He has not improved. He denies any specific fever or chills. There is no nausea or vomiting or abdominal pain. He says that the pain is so severe that he has not really been able to walk. In addition to the leg pain also appears to have swelling in both his legs and on the left than the right with a rash that has developed below the knees bilaterally in the last few days. 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 He has never had back problems in the past. Evaluation in the emergency room with a CT scan of the abdomen shows iliopsoas abscesses on the left side. He is now being admitted for further management.   Review of Systems:  Apart from symptoms above, all systems negative.  Past Medical History  Diagnosis Date  . H/O back injury   . MRSA (methicillin resistant Staphylococcus aureus)    Past Surgical History  Procedure Laterality Date  . Skin biopsy     Social History:  reports that he has been smoking Cigarettes.  He has been smoking about 1.00 pack per day. He has never used smokeless tobacco. He reports that he does not drink alcohol or use illicit drugs.  Allergies  Allergen Reactions  . Vicodin [Hydrocodone-Acetaminophen] Hives and Itching  . Adhesive [Tape]     Family History  Problem Relation Age of Onset  . Cancer Father       Prior to Admission medications   Medication Sig Start Date End Date Taking? Authorizing Provider  diphenhydramine-acetaminophen (TYLENOL PM) 25-500 MG TABS Take 1 tablet by mouth at bedtime as  needed.   Yes Historical Provider, MD  cyclobenzaprine (FLEXERIL) 5 MG tablet Take 1 tablet (5 mg total) by mouth 3 (three) times daily as needed for muscle spasms. Patient not taking: Reported on 05/15/2015 05/04/15   Myra Rude, MD  meloxicam (MOBIC) 7.5 MG tablet Take 1 tablet (7.5 mg total) by mouth daily. Patient not taking: Reported on 05/15/2015 05/04/15   Myra Rude, MD  predniSONE (DELTASONE) 10 MG tablet Take 2 tablets (20 mg total) by mouth 2 (two) times daily. Patient not taking: Reported on 05/15/2015 05/05/15   Geoffery Lyons, MD   Physical Exam: Filed Vitals:   05/15/15 1340 05/15/15 1754 05/15/15 2021  BP: 115/77 118/71 125/71  Pulse: 122 90 92  Temp: 98.3 F (36.8 C)  99.1 F (37.3 C)  TempSrc: Oral  Oral  Resp: 16 16 16   Height: 6' (1.829 m)    Weight: 90.719 kg (200 lb)    SpO2: 98% 97% 99%    Wt Readings from Last 3 Encounters:  05/15/15 90.719 kg (200 lb)  05/15/15 90.719 kg (200 lb)  05/05/15 90.719 kg (200 lb)    General:  Appears uncomfortable and in pain on minimal movement. He is not toxic. Eyes: PERRL, normal lids, irises & conjunctiva ENT: grossly normal hearing, lips & tongue Neck: no LAD, masses or thyromegaly Cardiovascular: RRR, no m/r/g. No LE edema. Telemetry:  SR, no arrhythmias  Respiratory: CTA bilaterally, no w/r/r. Normal respiratory effort. Abdomen: soft, ntnd Skin: Papular rash in the lower legs bilaterally. Musculoskeletal: Both his legs appeared to be swollen more on the left than the right. He will not allow me to move his legs because he is in severe pain. Psychiatric: grossly normal mood and affect, speech fluent and appropriate Neurologic: grossly non-focal.          Labs on Admission:  Basic Metabolic Panel:  Recent Labs Lab 05/15/15 1625  NA 129*  K 4.5  CL 92*  CO2 27  GLUCOSE 113*  BUN 15  CREATININE 0.71  CALCIUM 8.5*   Liver Function Tests:  Recent Labs Lab 05/15/15 1625  AST 69*  ALT 174*    ALKPHOS 381*  BILITOT 1.2  PROT 7.6  ALBUMIN 2.3*    Recent Labs Lab 05/15/15 1625  LIPASE 18*   No results for input(s): AMMONIA in the last 168 hours. CBC:  Recent Labs Lab 05/15/15 1625  WBC 26.3*  NEUTROABS 22.7*  HGB 11.8*  HCT 35.1*  MCV 82.6  PLT 508*   Cardiac Enzymes: No results for input(s): CKTOTAL, CKMB, CKMBINDEX, TROPONINI in the last 168 hours.  BNP (last 3 results) No results for input(s): BNP in the last 8760 hours.  ProBNP (last 3 results) No results for input(s): PROBNP in the last 8760 hours.  CBG: No results for input(s): GLUCAP in the last 168 hours.  Radiological Exams on Admission: Dg Chest 2 View  05/15/2015   CLINICAL DATA:  Smoker. Difficulty eating with abdominal pain. Swelling in rash involving both feet. Back pain.  EXAM: CHEST  2 VIEW  COMPARISON:  None.  FINDINGS: Cardiomediastinal silhouette is within normal limits. The lungs are mildly hypoinflated without evidence of airspace consolidation, edema, pleural effusion, or pneumothorax. No acute osseous abnormality is identified.  IMPRESSION: No active cardiopulmonary disease.   Electronically Signed   By: Sebastian Ache   On: 05/15/2015 16:51   Mr Lumbar Spine Wo Contrast  05/15/2015   CLINICAL DATA:  39 year old male with severe lumbar back pain involving the left lower extremity x1 month. Weakness.  EXAM: MRI LUMBAR SPINE WITHOUT CONTRAST  TECHNIQUE: Multiplanar, multisequence MR imaging of the lumbar spine was performed. No intravenous contrast was administered.  COMPARISON:  Lumbar spine radiographs 11/22/2012.  FINDINGS: Study is intermittently degraded by motion artifact despite repeated imaging attempts.  Normal lumbar segmentation demonstrated in 2014. Stable vertebral height and alignment.  There is disc desiccation at T11-T12 and disc bulge which results in borderline to mild spinal stenosis. No mass effect on the lower thoracic spinal cord at this level. No signal abnormality in the  lower thoracic spinal cord or conus, the tip of the conus is at L1-L2. The T12-L1 through L3-L4 discs then are normal.  There is severe edema in the medial and posterior left psoas muscle beginning at the L2-L3 level but increasing along the caudal aspect of the muscle. There is also severe edema in the visible left iliacus muscle and in the left posterior paraspinal muscles (erector spine I) beginning at the L4-L5 level and continuing inferiorly. There is some evidence of left sacral ala marrow edema (series 5, image 15).  There is a superimposed posterior epidural mass extending from the mid L4 to the mid S1 level. This measures up to 10 mm AP thickness and contributes to severe spinal stenosis at the L4-L5 and L5-S1 levels due to mass effect on the posterior thecal sac (  series 7, image 31). There is chronic disc degeneration at each 1 of these levels with disc bulging and central disc herniation.  Furthermore there is abnormal presacral STIR signal (series 5, image 19).  The right side paraspinal muscles and soft tissues appear normal.  IMPRESSION: 1. Marked abnormality in the left psoas, iliacus, and lower paraspinal muscles. The epicenter seems to be the left sacral ala which appears edematous. Leading consideration at this point is septic left SI joint. In light of the patient's difficulty with MRI, CT Abdomen and Pelvis with oral and IV contrast may be most valuable at this point. 2. Widespread posterior epidural space-occupying mass might be infectious phlegmon. This contributes to severe spinal stenosis at L4-L5 and L5-S1 due to posterior thecal sac mass effect. There is underlying L4-L5 and L5-S1 disc degeneration. 3. No definite marrow edema or acute osseous abnormality in the lumbar spine. Study discussed by telephone with Dr. Vanetta Mulders on 05/15/2015 at 1730 hr.   Electronically Signed   By: Odessa Fleming M.D.   On: 05/15/2015 18:12   Ct Abdomen Pelvis W Contrast  05/15/2015   CLINICAL DATA:  Patient  with rash and swelling to the bilateral feet. Lower abdominal pain for 1 month.  EXAM: CT ABDOMEN AND PELVIS WITH CONTRAST  TECHNIQUE: Multidetector CT imaging of the abdomen and pelvis was performed using the standard protocol following bolus administration of intravenous contrast.  CONTRAST:  OMNIPAQUE IOHEXOL 300 MG/ML SOLN, 25mL OMNIPAQUE IOHEXOL 300 MG/ML SOLN  COMPARISON:  MRI lumbar spine 05/15/2015  FINDINGS: Lower chest: Normal heart size. No consolidative or nodular pulmonary opacities. No pleural effusion.  Hepatobiliary: Liver is normal in size and contour without focal hepatic lesion identified. Gallbladder is decompressed. No intrahepatic or extrahepatic biliary ductal dilatation.  Pancreas: Unremarkable  Spleen: Multiple calcified granulomata.  Adrenals/Urinary Tract: Adrenal glands are normal. Kidneys enhance symmetrically with contrast. No hydronephrosis.  Stomach/Bowel: No abnormal bowel wall thickening or evidence for bowel obstruction. The appendix is normal. No free intraperitoneal air.  Vascular/Lymphatic: There is significant mass effect on the left internal and external iliac veins from adjacent loculated fluid collection. Normal caliber abdominal aorta.  Other: None  Musculoskeletal: There is expansion of left psoas muscle from the approximate L2 vertebral level extending caudally were there is also expansion of the iliacus muscle. Near the junction there are multiple adjacent loculated appearing rim enhancing fluid collections with at least 1 of these collections appearing to contain small foci of gas. Collection within the left iliacus musculature measures 4.9 x 4.5 x 7.8 cm (image 67; series 2). Collections appear to extend into the left pelvic sidewall musculature. There appears to be some fat stranding about the proximal left lower extremity musculature. No definite osseous cortical destruction identified either involving the lower lumbar spine or pelvis.  IMPRESSION: Abnormal  thickening and expansion of the left ileopsoas musculature which appears to contain multiple rim enhancing gas containing fluid collections most compatible with abscesses. It is not clear the exact origin of this apparent infectious process. No definite evidence for osseous erosion of the lumbar spine or pelvis on CT imaging.  The muscular expansion and abscesses exert significant mass effect on the left internal and external iliac veins.  Left hemi-pelvic and inguinal adenopathy likely reactive.   Electronically Signed   By: Annia Belt M.D.   On: 05/15/2015 19:29      Assessment/Plan   1. Left iliopsoas abscesses. He will be treated empirically with intravenous anti-biotics. He will likely  require interventional radiology for incision and drainage. He will require infectious disease consultation for further recommendations. The etiology of this is not clear to me whatsoever. He does not seem to have any risk factors for abscess. He is not diabetic. He denies any IV drug abuse. We will check HIV status. He does not have a heart murmur. In view of his complicated picture, he will need specialty care and would best be served at [facility] where infectious disease can see him. Therefore, we will transfer him there. 2. Elevated alkaline phosphatase, AST and LFT. This may be reflection of infection, versus an inflammatory process. Monitor closely.  Further recommendations will depend on patient's hospital progress. Surgery may need to be consulted formally.  Code Status: Full code.   DVT Prophylaxis: SCDs  Family Communication: I discussed the plan with the patient at the bedside.   Disposition Plan: Home when medically stable.  Time spent: 60 minutes.  Wilson Singer [facility] Pager [phone].

## 2015-05-15 NOTE — ED Notes (Signed)
Patient was transported to [facility] via [place].

## 2015-05-15 NOTE — Progress Notes (Signed)
ANTIBIOTIC CONSULT NOTE - INITIAL  Pharmacy Consult for Vancomycin & Zosyn Indication:  Intra abdominal infection, abscess  Allergies  Allergen Reactions  . Vicodin [Hydrocodone-Acetaminophen] Hives and Itching  . Adhesive [Tape]     Patient Measurements: Height: 6' (182.9 cm) Weight: 200 lb (90.719 kg) IBW/kg (Calculated) : 77.6 Adjusted Body Weight:   Vital Signs: Temp: 99.1 F (37.3 C) (06/23 2021) Temp Source: Oral (06/23 2021) BP: 125/71 mmHg (06/23 2021) Pulse Rate: 92 (06/23 2021) Intake/Output from previous day:   Intake/Output from this shift:    Labs:  Recent Labs  05/15/15 1625  WBC 26.3*  HGB 11.8*  PLT 508*  CREATININE 0.71   Estimated Creatinine Clearance: 140.1 mL/min (by C-G formula based on Cr of 0.71). No results for input(s): VANCOTROUGH, VANCOPEAK, VANCORANDOM, GENTTROUGH, GENTPEAK, GENTRANDOM, TOBRATROUGH, TOBRAPEAK, TOBRARND, AMIKACINPEAK, AMIKACINTROU, AMIKACIN in the last 72 hours.   Microbiology: No results found for this or any previous visit (from the past 720 hour(s)).  Medical History: Past Medical History  Diagnosis Date  . H/O back injury   . MRSA (methicillin resistant Staphylococcus aureus)                 Assessment: Left iliopsoas abscess. Empirical coverage IV antibiotics. Likely to require interventional radiology for incision and drainage. Transfer to [facility]. Zosyn 3.375 GM IV given in ED   Goal of Therapy:  Vancomycin trough 15-20   Plan:  Vancomycin 2 GM IV loading dose, given as #2 Vancomycin 1 GM infusions. Then Vancomycin 1 GM IV every 8 hours Zosyn 3.375 GM IV every 8 hours Vancomycin trough at steady state Labs per protocol  Raquel James, George Alcantar Bennett 05/15/2015,10:16 PM

## 2015-05-15 NOTE — ED Notes (Signed)
Pt notified of wait time.

## 2015-05-15 NOTE — ED Notes (Signed)
Patient given Healthy Choice meal and orange juice at this time.

## 2015-05-15 NOTE — ED Notes (Signed)
Pt c/o joints aching and burning upon urination.

## 2015-05-15 NOTE — ED Provider Notes (Addendum)
CSN: [id number]     Arrival date & time 05/15/15  1336 History   First MD Initiated Contact with Patient 05/15/15 1453     Chief Complaint  Patient presents with  . Fatigue     (Consider location/radiation/quality/duration/timing/severity/associated sxs/prior Treatment) The history is provided by the patient.   39 year old male with history of night sweats since the middle of May. Onset of left lower back pain on June 7. It would be worse with any kind of jarring. Patient seen at [facility] [place] on June 12 and 13th. For low back pain. Treated with steroid. Patient stated that time he had pain radiating into the left leg. With some numbness to the foot. No significant weakness. Symptoms have gotten worse and persistent now with significant left lumbar back pain.  In addition is been swelling to both legs and has a rash below the knees in both legs swelling is greater to the left leg. Some redness to both legs.  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 Patient's never had any problems with the back in the past.  Patient denies headache no nausea or vomiting. No distinct fever.  Past Medical History  Diagnosis Date  . H/O back injury   . MRSA (methicillin resistant Staphylococcus aureus)    Past Surgical History  Procedure Laterality Date  . Skin biopsy     Family History  Problem Relation Age of Onset  . Cancer Father    History  Substance Use Topics  . Smoking status: Heavy Tobacco Smoker -- 1.00 packs/day    Types: Cigarettes  . Smokeless tobacco: Never Used  . Alcohol Use: No    Review of Systems  Constitutional: Positive for diaphoresis.  HENT: Negative for congestion.   Eyes: Negative for visual disturbance.  Respiratory: Negative for shortness of breath.   Cardiovascular: Negative for chest pain.  Gastrointestinal: Negative for nausea, vomiting and abdominal pain.  Genitourinary: Negative for dysuria and  hematuria.  Musculoskeletal: Positive for back pain. Negative for neck pain and neck stiffness.  Skin: Positive for rash.  Neurological: Positive for numbness. Negative for headaches.  Hematological: Does not bruise/bleed easily.  Psychiatric/Behavioral: Negative for confusion.      Allergies  Vicodin and Adhesive  Home Medications   Prior to Admission medications   Medication Sig Start Date End Date Taking? Authorizing Provider  diphenhydramine-acetaminophen (TYLENOL PM) 25-500 MG TABS Take 1 tablet by mouth at bedtime as needed.   Yes Historical Provider, MD  cyclobenzaprine (FLEXERIL) 5 MG tablet Take 1 tablet (5 mg total) by mouth 3 (three) times daily as needed for muscle spasms. Patient not taking: Reported on 05/15/2015 05/04/15   Myra Rude, MD  meloxicam (MOBIC) 7.5 MG tablet Take 1 tablet (7.5 mg total) by mouth daily. Patient not taking: Reported on 05/15/2015 05/04/15   Myra Rude, MD  predniSONE (DELTASONE) 10 MG tablet Take 2 tablets (20 mg total) by mouth 2 (two) times daily. Patient not taking: Reported on 05/15/2015 05/05/15   Geoffery Lyons, MD   BP 118/71 mmHg  Pulse 90  Temp(Src) 98.3 F (36.8 C) (Oral)  Resp 16  Ht 6' (1.829 m)  Wt 200 lb (90.719 kg)  BMI 27.12 kg/m2  SpO2 97% Physical Exam  Constitutional: He is oriented to person, place, and time. He appears well-developed and well-nourished. He appears distressed.  HENT:  Head: Normocephalic and atraumatic.  Mouth/Throat: Oropharynx is clear and moist.  Eyes: Conjunctivae are normal. Pupils are equal, round, and reactive to light.  Neck: Normal range of motion. Neck supple.  Cardiovascular: Regular rhythm and intact distal pulses.   No murmur heard. Tachycardic  Pulmonary/Chest: Effort normal and breath sounds normal.  Abdominal: Soft. Bowel sounds are normal. There is no tenderness.  Genitourinary:  Mild tenderness to groin and scrotal area but no masses no significant swelling.   Musculoskeletal: Normal range of motion. He exhibits edema and tenderness.  Bilateral leg swelling. Legs from the knee below with the petechiae. Some erythema. Tenderness to palpation along the left sacral area and left iliac crest. No skin changes no redness. No crepitance noted. Also some tenderness along the left paraspinous muscle area. On the lumbar area.  Neurological: He is alert and oriented to person, place, and time. No cranial nerve deficit. He exhibits normal muscle tone. Coordination normal.  Skin: Skin is warm. Rash noted. There is erythema.  Petechial rash to lower extremities of below the knee left a little greater than right. No vesicles no open lesions. Edema to both feet and ankles.  Vitals reviewed.   ED Course  Procedures (including critical care time) Labs Review Labs Reviewed  CBC WITH DIFFERENTIAL/PLATELET - Abnormal; Notable for the following:    WBC 26.3 (*)    Hemoglobin 11.8 (*)    HCT 35.1 (*)    Platelets 508 (*)    Neutrophils Relative % 86 (*)    Lymphocytes Relative 7 (*)    Neutro Abs 22.7 (*)    Monocytes Absolute 1.8 (*)    All other components within normal limits  COMPREHENSIVE METABOLIC PANEL - Abnormal; Notable for the following:    Sodium 129 (*)    Chloride 92 (*)    Glucose, Bld 113 (*)    Calcium 8.5 (*)    Albumin 2.3 (*)    AST 69 (*)    ALT 174 (*)    Alkaline Phosphatase 381 (*)    All other components within normal limits  LIPASE, BLOOD - Abnormal; Notable for the following:    Lipase 18 (*)    All other components within normal limits  PROTIME-INR - Abnormal; Notable for the following:    Prothrombin Time 16.6 (*)    All other components within normal limits  URINALYSIS, ROUTINE W REFLEX MICROSCOPIC (NOT AT [facility]) - Abnormal; Notable for the following:    Hgb urine dipstick LARGE (*)    Protein, ur 30 (*)    Urobilinogen, UA 4.0 (*)    All other components within normal limits  URINE MICROSCOPIC-ADD ON - Abnormal; Notable  for the following:    Bacteria, UA FEW (*)    All other components within normal limits  SEDIMENTATION RATE - Abnormal; Notable for the following:    Sed Rate 83 (*)    All other components within normal limits  CULTURE, BLOOD (ROUTINE X 2)  CULTURE, BLOOD (ROUTINE X 2)  URINE CULTURE  I-STAT CG4 LACTIC ACID, ED  I-STAT CG4 LACTIC ACID, ED   Results for orders placed or performed during the hospital encounter of 05/15/15  CBC with Differential/Platelet  Result Value Ref Range   WBC 26.3 (H) 4.0 - 10.5 K/uL   RBC 4.25 4.22 - 5.81 MIL/uL   Hemoglobin 11.8 (L) 13.0 - 17.0 g/dL   HCT 16.1 (L) 09.6 - 04.5 %   MCV 82.6 78.0 - 100.0 fL   MCH 27.8 26.0 - 34.0 pg   MCHC 33.6 30.0 - 36.0  g/dL   RDW 16.1 09.6 - 04.5 %   Platelets 508 (H) 150 - 400 K/uL   Neutrophils Relative % 86 (H) 43 - 77 %   Lymphocytes Relative 7 (L) 12 - 46 %   Monocytes Relative 7 3 - 12 %   Eosinophils Relative 0 0 - 5 %   Basophils Relative 0 0 - 1 %   Neutro Abs 22.7 (H) 1.7 - 7.7 K/uL   Lymphs Abs 1.8 0.7 - 4.0 K/uL   Monocytes Absolute 1.8 (H) 0.1 - 1.0 K/uL   Eosinophils Absolute 0.0 0.0 - 0.7 K/uL   Basophils Absolute 0.0 0.0 - 0.1 K/uL   WBC Morphology WHITE COUNT CONFIRMED ON SMEAR    Smear Review PLATELET COUNT CONFIRMED BY SMEAR   Comprehensive metabolic panel  Result Value Ref Range   Sodium 129 (L) 135 - 145 mmol/L   Potassium 4.5 3.5 - 5.1 mmol/L   Chloride 92 (L) 101 - 111 mmol/L   CO2 27 22 - 32 mmol/L   Glucose, Bld 113 (H) 65 - 99 mg/dL   BUN 15 6 - 20 mg/dL   Creatinine, Ser 4.09 0.61 - 1.24 mg/dL   Calcium 8.5 (L) 8.9 - 10.3 mg/dL   Total Protein 7.6 6.5 - 8.1 g/dL   Albumin 2.3 (L) 3.5 - 5.0 g/dL   AST 69 (H) 15 - 41 U/L   ALT 174 (H) 17 - 63 U/L   Alkaline Phosphatase 381 (H) 38 - 126 U/L   Total Bilirubin 1.2 0.3 - 1.2 mg/dL   GFR calc non Af Amer >60 >60 mL/min   GFR calc Af Amer >60 >60 mL/min   Anion gap 10 5 - 15  Lipase, blood  Result Value Ref Range   Lipase 18 (L) 22  - 51 U/L  Protime-INR  Result Value Ref Range   Prothrombin Time 16.6 (H) 11.6 - 15.2 seconds   INR 1.33 0.00 - 1.49  Urinalysis, Routine w reflex microscopic (not at [facility])  Result Value Ref Range   Color, Urine YELLOW YELLOW   APPearance CLEAR CLEAR   Specific Gravity, Urine 1.025 1.005 - 1.030   pH 5.5 5.0 - 8.0   Glucose, UA NEGATIVE NEGATIVE mg/dL   Hgb urine dipstick LARGE (A) NEGATIVE   Bilirubin Urine NEGATIVE NEGATIVE   Ketones, ur NEGATIVE NEGATIVE mg/dL   Protein, ur 30 (A) NEGATIVE mg/dL   Urobilinogen, UA 4.0 (H) 0.0 - 1.0 mg/dL   Nitrite NEGATIVE NEGATIVE   Leukocytes, UA NEGATIVE NEGATIVE  Urine microscopic-add on  Result Value Ref Range   Squamous Epithelial / LPF RARE RARE   WBC, UA 11-20 <3 WBC/hpf   RBC / HPF 21-50 <3 RBC/hpf   Bacteria, UA FEW (A) RARE   Urine-Other TRICHOMONAS PRESENT   Sedimentation rate  Result Value Ref Range   Sed Rate 83 (H) 0 - 16 mm/hr  I-Stat CG4 Lactic Acid, ED  Result Value Ref Range   Lactic Acid, Venous 1.06 0.5 - 2.0 mmol/L     Imaging Review Dg Chest 2 View  05/15/2015   CLINICAL DATA:  Smoker. Difficulty eating with abdominal pain. Swelling in rash involving both feet. Back pain.  EXAM: CHEST  2 VIEW  COMPARISON:  None.  FINDINGS: Cardiomediastinal silhouette is within normal limits. The lungs are mildly hypoinflated without evidence of airspace consolidation, edema, pleural effusion, or pneumothorax. No acute osseous abnormality is identified.  IMPRESSION: No active cardiopulmonary disease.   Electronically Signed  By: Sebastian Ache   On: 05/15/2015 16:51   Mr Lumbar Spine Wo Contrast  05/15/2015   CLINICAL DATA:  39 year old male with severe lumbar back pain involving the left lower extremity x1 month. Weakness.  EXAM: MRI LUMBAR SPINE WITHOUT CONTRAST  TECHNIQUE: Multiplanar, multisequence MR imaging of the lumbar spine was performed. No intravenous contrast was administered.  COMPARISON:  Lumbar spine radiographs  11/22/2012.  FINDINGS: Study is intermittently degraded by motion artifact despite repeated imaging attempts.  Normal lumbar segmentation demonstrated in 2014. Stable vertebral height and alignment.  There is disc desiccation at T11-T12 and disc bulge which results in borderline to mild spinal stenosis. No mass effect on the lower thoracic spinal cord at this level. No signal abnormality in the lower thoracic spinal cord or conus, the tip of the conus is at L1-L2. The T12-L1 through L3-L4 discs then are normal.  There is severe edema in the medial and posterior left psoas muscle beginning at the L2-L3 level but increasing along the caudal aspect of the muscle. There is also severe edema in the visible left iliacus muscle and in the left posterior paraspinal muscles (erector spine I) beginning at the L4-L5 level and continuing inferiorly. There is some evidence of left sacral ala marrow edema (series 5, image 15).  There is a superimposed posterior epidural mass extending from the mid L4 to the mid S1 level. This measures up to 10 mm AP thickness and contributes to severe spinal stenosis at the L4-L5 and L5-S1 levels due to mass effect on the posterior thecal sac (series 7, image 31). There is chronic disc degeneration at each 1 of these levels with disc bulging and central disc herniation.  Furthermore there is abnormal presacral STIR signal (series 5, image 19).  The right side paraspinal muscles and soft tissues appear normal.  IMPRESSION: 1. Marked abnormality in the left psoas, iliacus, and lower paraspinal muscles. The epicenter seems to be the left sacral ala which appears edematous. Leading consideration at this point is septic left SI joint. In light of the patient's difficulty with MRI, CT Abdomen and Pelvis with oral and IV contrast may be most valuable at this point. 2. Widespread posterior epidural space-occupying mass might be infectious phlegmon. This contributes to severe spinal stenosis at L4-L5 and  L5-S1 due to posterior thecal sac mass effect. There is underlying L4-L5 and L5-S1 disc degeneration. 3. No definite marrow edema or acute osseous abnormality in the lumbar spine. Study discussed by telephone with Dr. Vanetta Mulders on 05/15/2015 at 1730 hr.   Electronically Signed   By: Odessa Fleming M.D.   On: 05/15/2015 18:12   Ct Abdomen Pelvis W Contrast  05/15/2015   CLINICAL DATA:  Patient with rash and swelling to the bilateral feet. Lower abdominal pain for 1 month.  EXAM: CT ABDOMEN AND PELVIS WITH CONTRAST  TECHNIQUE: Multidetector CT imaging of the abdomen and pelvis was performed using the standard protocol following bolus administration of intravenous contrast.  CONTRAST:  OMNIPAQUE IOHEXOL 300 MG/ML SOLN, 25mL OMNIPAQUE IOHEXOL 300 MG/ML SOLN  COMPARISON:  MRI lumbar spine 05/15/2015  FINDINGS: Lower chest: Normal heart size. No consolidative or nodular pulmonary opacities. No pleural effusion.  Hepatobiliary: Liver is normal in size and contour without focal hepatic lesion identified. Gallbladder is decompressed. No intrahepatic or extrahepatic biliary ductal dilatation.  Pancreas: Unremarkable  Spleen: Multiple calcified granulomata.  Adrenals/Urinary Tract: Adrenal glands are normal. Kidneys enhance symmetrically with contrast. No hydronephrosis.  Stomach/Bowel: No abnormal bowel  wall thickening or evidence for bowel obstruction. The appendix is normal. No free intraperitoneal air.  Vascular/Lymphatic: There is significant mass effect on the left internal and external iliac veins from adjacent loculated fluid collection. Normal caliber abdominal aorta.  Other: None  Musculoskeletal: There is expansion of left psoas muscle from the approximate L2 vertebral level extending caudally were there is also expansion of the iliacus muscle. Near the junction there are multiple adjacent loculated appearing rim enhancing fluid collections with at least 1 of these collections appearing to contain small foci  of gas. Collection within the left iliacus musculature measures 4.9 x 4.5 x 7.8 cm (image 67; series 2). Collections appear to extend into the left pelvic sidewall musculature. There appears to be some fat stranding about the proximal left lower extremity musculature. No definite osseous cortical destruction identified either involving the lower lumbar spine or pelvis.  IMPRESSION: Abnormal thickening and expansion of the left ileopsoas musculature which appears to contain multiple rim enhancing gas containing fluid collections most compatible with abscesses. It is not clear the exact origin of this apparent infectious process. No definite evidence for osseous erosion of the lumbar spine or pelvis on CT imaging.  The muscular expansion and abscesses exert significant mass effect on the left internal and external iliac veins.  Left hemi-pelvic and inguinal adenopathy likely reactive.   Electronically Signed   By: Annia Belt M.D.   On: 05/15/2015 19:29     EKG Interpretation None      MDM   Final diagnoses:  Sciatica  Leg swelling  Pelvic pain in male      Patient with you a new finding of left so as the muscle abscesses. With the involvement of the lower lumbar and sacral nerve roots. No bony involvement. Discussed with hospitalist here will probably require transfer to [facility] for the hospitalist service there. Discussed with her surgeon covering here and recommended that we talked a surgeon down to [facility] I did that. Spoke with Dr. Andrey Campanile. That it would most likely be interventional radiology. He requested that once evaluated by medicine at [facility] that if they felt they were needed he could be called.  Exact cause of this is not clear as well as patient's bilateral lower extremity edema and petechiae the exact cause of that is not clear as well either. Patient without true fevers here lactic acid is not elevated. No hypertension. Did have some tachycardia but improved with pain medicine and fluids. No  evidence of any intra-abdominal source. Patient's platelet counts are elevated. Patient's renal function is normal. The petechiae are only on the lower extremities. Patient has no headache.  Antibodies started here.  Vanetta Mulders, MD 05/15/15 0100  Vanetta Mulders, MD 05/15/15 2142

## 2015-05-16 ENCOUNTER — Inpatient Hospital Stay (HOSPITAL_COMMUNITY): Payer: Medicaid Other

## 2015-05-16 ENCOUNTER — Encounter (HOSPITAL_COMMUNITY): Payer: Self-pay | Admitting: *Deleted

## 2015-05-16 DIAGNOSIS — D72829 Elevated white blood cell count, unspecified: Secondary | ICD-10-CM

## 2015-05-16 DIAGNOSIS — E871 Hypo-osmolality and hyponatremia: Secondary | ICD-10-CM

## 2015-05-16 LAB — RAPID URINE DRUG SCREEN, HOSP PERFORMED
Amphetamines: NOT DETECTED
BENZODIAZEPINES: NOT DETECTED
Barbiturates: NOT DETECTED
COCAINE: NOT DETECTED
Opiates: POSITIVE — AB
TETRAHYDROCANNABINOL: NOT DETECTED

## 2015-05-16 LAB — MRSA PCR SCREENING: MRSA BY PCR: POSITIVE — AB

## 2015-05-16 MED ORDER — MUPIROCIN 2 % EX OINT
1.0000 "application " | TOPICAL_OINTMENT | Freq: Two times a day (BID) | CUTANEOUS | Status: AC
  Administered 2015-05-16 – 2015-05-20 (×10): 1 via NASAL
  Filled 2015-05-16: qty 22

## 2015-05-16 MED ORDER — MIDAZOLAM HCL 2 MG/2ML IJ SOLN
INTRAMUSCULAR | Status: AC
  Filled 2015-05-16: qty 2

## 2015-05-16 MED ORDER — FENTANYL CITRATE (PF) 100 MCG/2ML IJ SOLN
INTRAMUSCULAR | Status: AC
  Filled 2015-05-16: qty 2

## 2015-05-16 MED ORDER — HYDROMORPHONE HCL 1 MG/ML IJ SOLN
INTRAMUSCULAR | Status: AC
  Administered 2015-05-16: 1 mg via INTRAVENOUS
  Filled 2015-05-16: qty 2

## 2015-05-16 MED ORDER — LIDOCAINE-EPINEPHRINE 1 %-1:100000 IJ SOLN
INTRAMUSCULAR | Status: AC
  Filled 2015-05-16: qty 1

## 2015-05-16 MED ORDER — FENTANYL CITRATE (PF) 100 MCG/2ML IJ SOLN
INTRAMUSCULAR | Status: DC
Start: 2015-05-16 — End: 2015-05-16
  Filled 2015-05-16: qty 2

## 2015-05-16 MED ORDER — CHLORHEXIDINE GLUCONATE CLOTH 2 % EX PADS
6.0000 | MEDICATED_PAD | Freq: Every day | CUTANEOUS | Status: AC
  Administered 2015-05-18 – 2015-05-20 (×3): 6 via TOPICAL

## 2015-05-16 MED ORDER — OXYCODONE HCL 5 MG PO TABS
5.0000 mg | ORAL_TABLET | Freq: Four times a day (QID) | ORAL | Status: DC | PRN
  Administered 2015-05-16 (×3): 10 mg via ORAL
  Filled 2015-05-16 (×3): qty 2

## 2015-05-16 MED ORDER — PNEUMOCOCCAL VAC POLYVALENT 25 MCG/0.5ML IJ INJ
0.5000 mL | INJECTION | INTRAMUSCULAR | Status: AC
  Administered 2015-05-17: 0.5 mL via INTRAMUSCULAR
  Filled 2015-05-16: qty 0.5

## 2015-05-16 MED ORDER — FENTANYL CITRATE (PF) 100 MCG/2ML IJ SOLN
INTRAMUSCULAR | Status: AC | PRN
  Administered 2015-05-16 (×2): 100 ug via INTRAVENOUS

## 2015-05-16 MED ORDER — MIDAZOLAM HCL 2 MG/2ML IJ SOLN
INTRAMUSCULAR | Status: AC | PRN
  Administered 2015-05-16: 2 mg via INTRAVENOUS
  Administered 2015-05-16: 1 mg via INTRAVENOUS

## 2015-05-16 MED ORDER — HYDROMORPHONE HCL 1 MG/ML IJ SOLN
1.0000 mg | INTRAMUSCULAR | Status: DC | PRN
  Administered 2015-05-16 – 2015-05-17 (×9): 1 mg via INTRAVENOUS
  Filled 2015-05-16 (×9): qty 1

## 2015-05-16 NOTE — Sedation Documentation (Signed)
Patient denies pain and is resting comfortably.  

## 2015-05-16 NOTE — Progress Notes (Signed)
Have reviewed the patients images.  We recommend that the primary service order perc drainage of the left ileopsoas abscess.  Formal consult to follow.    Jorje Guild, PA-C General Surgery [facility] [phone]

## 2015-05-16 NOTE — Consult Note (Signed)
[facility] Surgery Consult Note  [name] 02/25/1976  [id number].    Requesting MD: Dr. Anastasio Champion Chief Complaint/Reason for Consult: Left Iliopsoas abscess  HPI:  39 y/o white male presents to [facility] with significant 10/10 left lower back pain for 1 month.  He states he was seen in the ED in [facility] almost 2 weeks ago and they thought it was a pinched sciatic nerve in his back causing the pain so they placed him on NSAIDS, narcotics, muscle relaxer's, and steroids.  His pain did not improve.  He noted on and off sweating and chills, but no real fever.  There is no nausea, vomiting, but does have left flank/outer left abdominal pain and anorexia.  No CP/SOB, no diarrhea, no headaches, no tooth pain.  He says that the pain is so severe that he has not really been able to walk.  He also notes a rash on both lower legs/feet up to mid shin with red sores and significant swelling to both legs.  He says the swelling and redness has gone down since being here on antibiotics.  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  He says he gets frequent small abscesses secondary to his job from scratches or bug bites.  He notes he usually tries to pop them on his own, but has had several I&D'ed in the past.  He denies having MRSA in the past.  He has never had back problems in the past.  He admits to using narcotics for the back pain.  He adamantly denies IVDU, no recent tattoo's in 6 months, no blood transfusions.    CT scan of the abdomen shows left iliopsoas abscesses.  He was transferred and admitted to [facility] hospital.  IR was consulted at our request and are pending IR perc drainage/aspiration.  WBC is 26,300.  ALT and AST are elevated as well as Alk phos to 381.  He says his wife is pregnant and wonders if she needs to take precautions.   ROS: All systems reviewed and otherwise negative except for as above  Family History  Problem Relation Age of Onset  .  Cancer Father     Past Medical History  Diagnosis Date  . H/O back injury   . MRSA (methicillin resistant Staphylococcus aureus)     Past Surgical History  Procedure Laterality Date  . Skin biopsy      Social History:  reports that he has been smoking Cigarettes.  He has been smoking about 1.00 pack per day. He has never used smokeless tobacco. He reports that he does not drink alcohol or use illicit drugs.  Allergies:  Allergies  Allergen Reactions  . Vicodin [Hydrocodone-Acetaminophen] Hives and Itching  . Adhesive [Tape]     Medications Prior to Admission  Medication Sig Dispense Refill  . diphenhydramine-acetaminophen (TYLENOL PM) 25-500 MG TABS Take 1 tablet by mouth at bedtime as needed.    . cyclobenzaprine (FLEXERIL) 5 MG tablet Take 1 tablet (5 mg total) by mouth 3 (three) times daily as needed for muscle spasms. (Patient not taking: Reported on 05/15/2015) 30 tablet 0  . meloxicam (MOBIC) 7.5 MG tablet Take 1 tablet (7.5 mg total) by mouth daily. (Patient not taking: Reported on 05/15/2015) 14 tablet 0  . predniSONE (DELTASONE) 10 MG tablet Take 2 tablets (20 mg total) by mouth 2 (two) times daily. (Patient not taking: Reported on 05/15/2015) 20 tablet 0    Blood pressure 132/68,  pulse 112, temperature 99.6 F (37.6 C), temperature source Oral, resp. rate 16, height 6' (1.829 m), weight 90.719 kg (200 lb), SpO2 99 %. Physical Exam: General: pleasant, WD/WN white male who is laying in bed in NAD HEENT: head is normocephalic, atraumatic.  Sclera are noninjected.  PERRL.  Ears and nose without any masses or lesions.  Mouth is pink and moist Heart: regular, rate, and rhythm.  No obvious murmurs, gallops, or rubs noted.  Palpable pedal pulses bilaterally Lungs: CTAB, no wheezes, rhonchi, or rales noted.  Respiratory effort nonlabored Abd: soft, ND, mild tenderness of the left flank/left lateral abdomen, +BS, no masses, hernias, or organomegaly, no scars MS: all 4 extremities  are symmetrical with no cyanosis, clubbing Skin: warm and dry, B/L feet and lower legs up to shin with erythematous rash with some larger circular macules/patches of various sizes up to 1cm. Psych: A&Ox3 with an appropriate affect.   Results for orders placed or performed during the hospital encounter of 05/15/15 (from the past 48 hour(s))  Culture, blood (routine x 2)     Status: None (Preliminary result)   Collection Time: 05/15/15  4:00 PM  Result Value Ref Range   Specimen Description BLOOD LEFT FOREARM DRAWN BY RN    Special Requests      BOTTLES DRAWN AEROBIC AND ANAEROBIC AEB=6CC ANA=4CC   Culture NO GROWTH < 12 HOURS    Report Status PENDING   Urinalysis, Routine w reflex microscopic (not at [facility])     Status: Abnormal   Collection Time: 05/15/15  4:15 PM  Result Value Ref Range   Color, Urine YELLOW YELLOW   APPearance CLEAR CLEAR   Specific Gravity, Urine 1.025 1.005 - 1.030   pH 5.5 5.0 - 8.0   Glucose, UA NEGATIVE NEGATIVE mg/dL   Hgb urine dipstick LARGE (A) NEGATIVE   Bilirubin Urine NEGATIVE NEGATIVE   Ketones, ur NEGATIVE NEGATIVE mg/dL   Protein, ur 30 (A) NEGATIVE mg/dL   Urobilinogen, UA 4.0 (H) 0.0 - 1.0 mg/dL   Nitrite NEGATIVE NEGATIVE   Leukocytes, UA NEGATIVE NEGATIVE  Urine microscopic-add on     Status: Abnormal   Collection Time: 05/15/15  4:15 PM  Result Value Ref Range   Squamous Epithelial / LPF RARE RARE   WBC, UA 11-20 <3 WBC/hpf   RBC / HPF 21-50 <3 RBC/hpf   Bacteria, UA FEW (A) RARE   Urine-Other TRICHOMONAS PRESENT   CBC with Differential/Platelet     Status: Abnormal   Collection Time: 05/15/15  4:25 PM  Result Value Ref Range   WBC 26.3 (H) 4.0 - 10.5 K/uL   RBC 4.25 4.22 - 5.81 MIL/uL   Hemoglobin 11.8 (L) 13.0 - 17.0 g/dL   HCT 35.1 (L) 39.0 - 52.0 %   MCV 82.6 78.0 - 100.0 fL   MCH 27.8 26.0 - 34.0 pg   MCHC 33.6 30.0 - 36.0 g/dL   RDW 14.7 11.5 - 15.5 %   Platelets 508 (H) 150 - 400 K/uL   Neutrophils Relative % 86 (H) 43 - 77  %   Lymphocytes Relative 7 (L) 12 - 46 %   Monocytes Relative 7 3 - 12 %   Eosinophils Relative 0 0 - 5 %   Basophils Relative 0 0 - 1 %   Neutro Abs 22.7 (H) 1.7 - 7.7 K/uL   Lymphs Abs 1.8 0.7 - 4.0 K/uL   Monocytes Absolute 1.8 (H) 0.1 - 1.0 K/uL   Eosinophils Absolute 0.0 0.0 -  0.7 K/uL   Basophils Absolute 0.0 0.0 - 0.1 K/uL   WBC Morphology WHITE COUNT CONFIRMED ON SMEAR     Comment: HYPERSEGMENTED NEUT   Smear Review PLATELET COUNT CONFIRMED BY SMEAR     Comment: PLATELETS APPEAR INCREASED LARGE PLATELETS PRESENT GIANT PLATELETS SEEN   Comprehensive metabolic panel     Status: Abnormal   Collection Time: 05/15/15  4:25 PM  Result Value Ref Range   Sodium 129 (L) 135 - 145 mmol/L   Potassium 4.5 3.5 - 5.1 mmol/L   Chloride 92 (L) 101 - 111 mmol/L   CO2 27 22 - 32 mmol/L   Glucose, Bld 113 (H) 65 - 99 mg/dL   BUN 15 6 - 20 mg/dL   Creatinine, Ser 0.71 0.61 - 1.24 mg/dL   Calcium 8.5 (L) 8.9 - 10.3 mg/dL   Total Protein 7.6 6.5 - 8.1 g/dL   Albumin 2.3 (L) 3.5 - 5.0 g/dL   AST 69 (H) 15 - 41 U/L   ALT 174 (H) 17 - 63 U/L   Alkaline Phosphatase 381 (H) 38 - 126 U/L   Total Bilirubin 1.2 0.3 - 1.2 mg/dL   GFR calc non Af Amer >60 >60 mL/min   GFR calc Af Amer >60 >60 mL/min    Comment: (NOTE) The eGFR has been calculated using the CKD EPI equation. This calculation has not been validated in all clinical situations. eGFR's persistently <60 mL/min signify possible Chronic Kidney Disease.    Anion gap 10 5 - 15  Lipase, blood     Status: Abnormal   Collection Time: 05/15/15  4:25 PM  Result Value Ref Range   Lipase 18 (L) 22 - 51 U/L  Protime-INR     Status: Abnormal   Collection Time: 05/15/15  4:25 PM  Result Value Ref Range   Prothrombin Time 16.6 (H) 11.6 - 15.2 seconds   INR 1.33 0.00 - 1.49  Sedimentation rate     Status: Abnormal   Collection Time: 05/15/15  4:25 PM  Result Value Ref Range   Sed Rate 83 (H) 0 - 16 mm/hr  Culture, blood (routine x 2)      Status: None (Preliminary result)   Collection Time: 05/15/15  4:25 PM  Result Value Ref Range   Specimen Description BLOOD RIGHT WRIST    Special Requests BOTTLES DRAWN AEROBIC AND ANAEROBIC 8CC EACH    Culture      GRAM POSITIVE COCCI IN CLUSTERS Gram Stain Report Called to,Read Back By and Verified With: HUDSON K. AT [place] AT 1043A ON [id number] BY THOMPSON S.    Report Status PENDING   I-Stat CG4 Lactic Acid, ED     Status: None   Collection Time: 05/15/15  6:06 PM  Result Value Ref Range   Lactic Acid, Venous 1.06 0.5 - 2.0 mmol/L  I-Stat CG4 Lactic Acid, ED     Status: None   Collection Time: 05/15/15  8:50 PM  Result Value Ref Range   Lactic Acid, Venous 1.15 0.5 - 2.0 mmol/L  MRSA PCR Screening     Status: Abnormal   Collection Time: 05/16/15  9:08 AM  Result Value Ref Range   MRSA by PCR POSITIVE (A) NEGATIVE    Comment: RESULT CALLED TO, READ BACK BY AND VERIFIED WITH: C. HUDSON RN 10:50 05/16/15 ([email or web address])        The GeneXpert MRSA Assay (FDA approved for NASAL specimens only), is one component of a comprehensive MRSA colonization surveillance program. It is  not intended to diagnose MRSA infection nor to guide or monitor treatment for MRSA infections.   Urine rapid drug screen (hosp performed)     Status: Abnormal   Collection Time: 05/16/15  9:44 AM  Result Value Ref Range   Opiates POSITIVE (A) NONE DETECTED   Cocaine NONE DETECTED NONE DETECTED   Benzodiazepines NONE DETECTED NONE DETECTED   Amphetamines NONE DETECTED NONE DETECTED   Tetrahydrocannabinol NONE DETECTED NONE DETECTED   Barbiturates NONE DETECTED NONE DETECTED    Comment:        DRUG SCREEN FOR MEDICAL PURPOSES ONLY.  IF CONFIRMATION IS NEEDED FOR ANY PURPOSE, NOTIFY LAB WITHIN 5 DAYS.        LOWEST DETECTABLE LIMITS FOR URINE DRUG SCREEN Drug Class       Cutoff (ng/mL) Amphetamine      1000 Barbiturate      200 Benzodiazepine   076 Tricyclics       808 Opiates          300 Cocaine           300 THC              50    Dg Chest 2 View  05/15/2015   CLINICAL DATA:  Smoker. Difficulty eating with abdominal pain. Swelling in rash involving both feet. Back pain.  EXAM: CHEST  2 VIEW  COMPARISON:  None.  FINDINGS: Cardiomediastinal silhouette is within normal limits. The lungs are mildly hypoinflated without evidence of airspace consolidation, edema, pleural effusion, or pneumothorax. No acute osseous abnormality is identified.  IMPRESSION: No active cardiopulmonary disease.   Electronically Signed   By: Logan Bores   On: 05/15/2015 16:51   Mr Lumbar Spine Wo Contrast  05/15/2015   CLINICAL DATA:  39 year old male with severe lumbar back pain involving the left lower extremity x1 month. Weakness.  EXAM: MRI LUMBAR SPINE WITHOUT CONTRAST  TECHNIQUE: Multiplanar, multisequence MR imaging of the lumbar spine was performed. No intravenous contrast was administered.  COMPARISON:  Lumbar spine radiographs 11/22/2012.  FINDINGS: Study is intermittently degraded by motion artifact despite repeated imaging attempts.  Normal lumbar segmentation demonstrated in 2014. Stable vertebral height and alignment.  There is disc desiccation at T11-T12 and disc bulge which results in borderline to mild spinal stenosis. No mass effect on the lower thoracic spinal cord at this level. No signal abnormality in the lower thoracic spinal cord or conus, the tip of the conus is at L1-L2. The T12-L1 through L3-L4 discs then are normal.  There is severe edema in the medial and posterior left psoas muscle beginning at the L2-L3 level but increasing along the caudal aspect of the muscle. There is also severe edema in the visible left iliacus muscle and in the left posterior paraspinal muscles (erector spine I) beginning at the L4-L5 level and continuing inferiorly. There is some evidence of left sacral ala marrow edema (series 5, image 15).  There is a superimposed posterior epidural mass extending from the mid L4 to the mid S1  level. This measures up to 10 mm AP thickness and contributes to severe spinal stenosis at the L4-L5 and L5-S1 levels due to mass effect on the posterior thecal sac (series 7, image 31). There is chronic disc degeneration at each 1 of these levels with disc bulging and central disc herniation.  Furthermore there is abnormal presacral STIR signal (series 5, image 19).  The right side paraspinal muscles and soft tissues appear normal.  IMPRESSION: 1. Marked abnormality  in the left psoas, iliacus, and lower paraspinal muscles. The epicenter seems to be the left sacral ala which appears edematous. Leading consideration at this point is septic left SI joint. In light of the patient's difficulty with MRI, CT Abdomen and Pelvis with oral and IV contrast may be most valuable at this point. 2. Widespread posterior epidural space-occupying mass might be infectious phlegmon. This contributes to severe spinal stenosis at L4-L5 and L5-S1 due to posterior thecal sac mass effect. There is underlying L4-L5 and L5-S1 disc degeneration. 3. No definite marrow edema or acute osseous abnormality in the lumbar spine. Study discussed by telephone with Dr. Fredia Sorrow on 05/15/2015 at 1730 hr.   Electronically Signed   By: Genevie Ann M.D.   On: 05/15/2015 18:12   Ct Abdomen Pelvis W Contrast  05/15/2015   CLINICAL DATA:  Patient with rash and swelling to the bilateral feet. Lower abdominal pain for 1 month.  EXAM: CT ABDOMEN AND PELVIS WITH CONTRAST  TECHNIQUE: Multidetector CT imaging of the abdomen and pelvis was performed using the standard protocol following bolus administration of intravenous contrast.  CONTRAST:  114mL OMNIPAQUE IOHEXOL 300 MG/ML SOLN, 36mL OMNIPAQUE IOHEXOL 300 MG/ML SOLN  COMPARISON:  MRI lumbar spine 05/15/2015  FINDINGS: Lower chest: Normal heart size. No consolidative or nodular pulmonary opacities. No pleural effusion.  Hepatobiliary: Liver is normal in size and contour without focal hepatic lesion  identified. Gallbladder is decompressed. No intrahepatic or extrahepatic biliary ductal dilatation.  Pancreas: Unremarkable  Spleen: Multiple calcified granulomata.  Adrenals/Urinary Tract: Adrenal glands are normal. Kidneys enhance symmetrically with contrast. No hydronephrosis.  Stomach/Bowel: No abnormal bowel wall thickening or evidence for bowel obstruction. The appendix is normal. No free intraperitoneal air.  Vascular/Lymphatic: There is significant mass effect on the left internal and external iliac veins from adjacent loculated fluid collection. Normal caliber abdominal aorta.  Other: None  Musculoskeletal: There is expansion of left psoas muscle from the approximate L2 vertebral level extending caudally were there is also expansion of the iliacus muscle. Near the junction there are multiple adjacent loculated appearing rim enhancing fluid collections with at least 1 of these collections appearing to contain small foci of gas. Collection within the left iliacus musculature measures 4.9 x 4.5 x 7.8 cm (image 67; series 2). Collections appear to extend into the left pelvic sidewall musculature. There appears to be some fat stranding about the proximal left lower extremity musculature. No definite osseous cortical destruction identified either involving the lower lumbar spine or pelvis.  IMPRESSION: Abnormal thickening and expansion of the left ileopsoas musculature which appears to contain multiple rim enhancing gas containing fluid collections most compatible with abscesses. It is not clear the exact origin of this apparent infectious process. No definite evidence for osseous erosion of the lumbar spine or pelvis on CT imaging.  The muscular expansion and abscesses exert significant mass effect on the left internal and external iliac veins.  Left hemi-pelvic and inguinal adenopathy likely reactive.   Electronically Signed   By: Lovey Newcomer M.D.   On: 05/15/2015 19:29      Assessment/Plan Left lower  back pain x 1 mo Iliopsoas abscess -Hopefully no surgical interventions will be needed, recommend IR drainage of the left iliopsoas abscess which is planned for today.    -His nasal swab was positive for MRSA, he is on precautions, also bacteremia -On Vanc Day #2, zosyn d/c'ed after one dose -Ambulate and IS -SCD's and dvt proph per primary -May need to be  placed on chlorhexidine/hibiclens wash 3x/week to help keep bacterial infections down given his frequent lesions -Will follow for now  Leukocytosis - 26.3 Bacteremia - gram positive cocci in clusters, await final and sensitivities Transaminitis - AST 69/ALT174 Elevated Alk Phos - 381  Feet rash/swelling - looks like staph skin infection H/o frequent small abscesses/furuncles  Transfer from [facility].  Also his wife is pregnant and should likely take precautions to avoid skin contact with her husband.   Nat Christen, [facility] Surgery 05/16/2015, 11:39 AM Pager: [phone]

## 2015-05-16 NOTE — Progress Notes (Signed)
Pt 39 yr old white male, admitted from [place]  ED with dx rt iliac abccess Pt. Awake alert and oriented x 4  Oriented to unit and the use of call light to call for safety precautiona  Reinforced. Call light within pt reach, MD on call made aware of Pt arrival on the unit. Initial assessment and admission data completed. Medicated for pain rated 10/10 to rt hip,..Little relief noted with application of cold compress and warm . Rn will continue to monitor.

## 2015-05-16 NOTE — Progress Notes (Addendum)
Received call from [facility] lab, reported pt's anaerobic cultures were positive for gram positive cocci and clusters. Dr. Benjamine Mola paged.   MRSA PCR also positive, place on contact precautions,  Lowella Dell 05/16/2015 10:45 AM

## 2015-05-16 NOTE — H&P (Signed)
Reason for consult: Left ileopsoas fluid collection.  Chief Complaint: Chief Complaint  Patient presents with  . Fatigue   Referring Physician(s): TR[facility]  History of Present Illness: [name] is a 39 y.o. male who presents c/o 10/10 left sided back pain. He states this has been going on x 1 month and has previously been seen in [facility] ED and given steroids that have not helped. He denies any IVDU.  Imaging done reveal concern for left ileopsoas abscess. IR received request for drain. The patient denies any chest pain, shortness of breath or palpitations. He denies any active signs of bleeding or excessive bruising. The patient denies any history of sleep apnea or chronic oxygen use. He has no known complications to sedation.    Past Medical History  Diagnosis Date  . H/O back injury   . MRSA (methicillin resistant Staphylococcus aureus)     Past Surgical History  Procedure Laterality Date  . Skin biopsy      Allergies: Vicodin and Adhesive  Medications: Prior to Admission medications   Medication Sig Start Date End Date Taking? Authorizing Provider  diphenhydramine-acetaminophen (TYLENOL PM) 25-500 MG TABS Take 1 tablet by mouth at bedtime as needed.   Yes Historical Provider, MD  cyclobenzaprine (FLEXERIL) 5 MG tablet Take 1 tablet (5 mg total) by mouth 3 (three) times daily as needed for muscle spasms. Patient not taking: Reported on 05/15/2015 05/04/15   Myra Rude, MD  meloxicam (MOBIC) 7.5 MG tablet Take 1 tablet (7.5 mg total) by mouth daily. Patient not taking: Reported on 05/15/2015 05/04/15   Myra Rude, MD  predniSONE (DELTASONE) 10 MG tablet Take 2 tablets (20 mg total) by mouth 2 (two) times daily. Patient not taking: Reported on 05/15/2015 05/05/15   Geoffery Lyons, MD     Family History  Problem Relation Age of Onset  . Cancer Father     History   Social History  . Marital Status: Married    Spouse Name: N/A  . Number of Children: N/A  . Years of  Education: N/A   Social History Main Topics  . Smoking status: Heavy Tobacco Smoker -- 1.00 packs/day    Types: Cigarettes  . Smokeless tobacco: Never Used  . Alcohol Use: No  . Drug Use: No     Comment: quit marijuana- 3-4 months ago  . Sexual Activity: Yes    Birth Control/ Protection: None   Other Topics Concern  . None   Social History Narrative   Review of Systems: A 12 point ROS discussed and pertinent positives are indicated in the HPI above.  All other systems are negative.  Review of Systems  Vital Signs: BP 132/68 mmHg  Pulse 112  Temp(Src) 99.6 F (37.6 C) (Oral)  Resp 16  Ht 6' (1.829 m)  Wt 200 lb (90.719 kg)  BMI 27.12 kg/m2  SpO2 99%  Physical Exam General: A&Ox3, appears in pain Heart: Tachycardic Lungs: CTA b/l Abd: Soft, NT, ND Back: left paraspinal TTP  Mallampati Score:  MD Evaluation Airway: WNL Heart: WNL Abdomen: WNL Chest/ Lungs: WNL ASA  Classification: 2 Mallampati/Airway Score: One  Imaging: Dg Chest 2 View  05/15/2015   CLINICAL DATA:  Smoker. Difficulty eating with abdominal pain. Swelling in rash involving both feet. Back pain.  EXAM: CHEST  2 VIEW  COMPARISON:  None.  FINDINGS: Cardiomediastinal silhouette is within normal limits. The lungs are mildly hypoinflated without evidence of airspace consolidation, edema, pleural effusion, or pneumothorax. No acute  osseous abnormality is identified.  IMPRESSION: No active cardiopulmonary disease.   Electronically Signed   By: Sebastian Ache   On: 05/15/2015 16:51   Mr Lumbar Spine Wo Contrast  05/15/2015   CLINICAL DATA:  39 year old male with severe lumbar back pain involving the left lower extremity x1 month. Weakness.  EXAM: MRI LUMBAR SPINE WITHOUT CONTRAST  TECHNIQUE: Multiplanar, multisequence MR imaging of the lumbar spine was performed. No intravenous contrast was administered.  COMPARISON:  Lumbar spine radiographs 11/22/2012.  FINDINGS: Study is intermittently degraded by motion  artifact despite repeated imaging attempts.  Normal lumbar segmentation demonstrated in 2014. Stable vertebral height and alignment.  There is disc desiccation at T11-T12 and disc bulge which results in borderline to mild spinal stenosis. No mass effect on the lower thoracic spinal cord at this level. No signal abnormality in the lower thoracic spinal cord or conus, the tip of the conus is at L1-L2. The T12-L1 through L3-L4 discs then are normal.  There is severe edema in the medial and posterior left psoas muscle beginning at the L2-L3 level but increasing along the caudal aspect of the muscle. There is also severe edema in the visible left iliacus muscle and in the left posterior paraspinal muscles (erector spine I) beginning at the L4-L5 level and continuing inferiorly. There is some evidence of left sacral ala marrow edema (series 5, image 15).  There is a superimposed posterior epidural mass extending from the mid L4 to the mid S1 level. This measures up to 10 mm AP thickness and contributes to severe spinal stenosis at the L4-L5 and L5-S1 levels due to mass effect on the posterior thecal sac (series 7, image 31). There is chronic disc degeneration at each 1 of these levels with disc bulging and central disc herniation.  Furthermore there is abnormal presacral STIR signal (series 5, image 19).  The right side paraspinal muscles and soft tissues appear normal.  IMPRESSION: 1. Marked abnormality in the left psoas, iliacus, and lower paraspinal muscles. The epicenter seems to be the left sacral ala which appears edematous. Leading consideration at this point is septic left SI joint. In light of the patient's difficulty with MRI, CT Abdomen and Pelvis with oral and IV contrast may be most valuable at this point. 2. Widespread posterior epidural space-occupying mass might be infectious phlegmon. This contributes to severe spinal stenosis at L4-L5 and L5-S1 due to posterior thecal sac mass effect. There is underlying  L4-L5 and L5-S1 disc degeneration. 3. No definite marrow edema or acute osseous abnormality in the lumbar spine. Study discussed by telephone with Dr. Vanetta Mulders on 05/15/2015 at 1730 hr.   Electronically Signed   By: Odessa Fleming M.D.   On: 05/15/2015 18:12   Ct Abdomen Pelvis W Contrast  05/15/2015   CLINICAL DATA:  Patient with rash and swelling to the bilateral feet. Lower abdominal pain for 1 month.  EXAM: CT ABDOMEN AND PELVIS WITH CONTRAST  TECHNIQUE: Multidetector CT imaging of the abdomen and pelvis was performed using the standard protocol following bolus administration of intravenous contrast.  CONTRAST:  OMNIPAQUE IOHEXOL 300 MG/ML SOLN, 25mL OMNIPAQUE IOHEXOL 300 MG/ML SOLN  COMPARISON:  MRI lumbar spine 05/15/2015  FINDINGS: Lower chest: Normal heart size. No consolidative or nodular pulmonary opacities. No pleural effusion.  Hepatobiliary: Liver is normal in size and contour without focal hepatic lesion identified. Gallbladder is decompressed. No intrahepatic or extrahepatic biliary ductal dilatation.  Pancreas: Unremarkable  Spleen: Multiple calcified granulomata.  Adrenals/Urinary Tract:  Adrenal glands are normal. Kidneys enhance symmetrically with contrast. No hydronephrosis.  Stomach/Bowel: No abnormal bowel wall thickening or evidence for bowel obstruction. The appendix is normal. No free intraperitoneal air.  Vascular/Lymphatic: There is significant mass effect on the left internal and external iliac veins from adjacent loculated fluid collection. Normal caliber abdominal aorta.  Other: None  Musculoskeletal: There is expansion of left psoas muscle from the approximate L2 vertebral level extending caudally were there is also expansion of the iliacus muscle. Near the junction there are multiple adjacent loculated appearing rim enhancing fluid collections with at least 1 of these collections appearing to contain small foci of gas. Collection within the left iliacus musculature measures 4.9  x 4.5 x 7.8 cm (image 67; series 2). Collections appear to extend into the left pelvic sidewall musculature. There appears to be some fat stranding about the proximal left lower extremity musculature. No definite osseous cortical destruction identified either involving the lower lumbar spine or pelvis.  IMPRESSION: Abnormal thickening and expansion of the left ileopsoas musculature which appears to contain multiple rim enhancing gas containing fluid collections most compatible with abscesses. It is not clear the exact origin of this apparent infectious process. No definite evidence for osseous erosion of the lumbar spine or pelvis on CT imaging.  The muscular expansion and abscesses exert significant mass effect on the left internal and external iliac veins.  Left hemi-pelvic and inguinal adenopathy likely reactive.   Electronically Signed   By: Annia Belt M.D.   On: 05/15/2015 19:29   Labs:  CBC:  Recent Labs  05/15/15 1625  WBC 26.3*  HGB 11.8*  HCT 35.1*  PLT 508*    COAGS:  Recent Labs  05/15/15 1625  INR 1.33    BMP:  Recent Labs  05/15/15 1625  NA 129*  K 4.5  CL 92*  CO2 27  GLUCOSE 113*  BUN 15  CALCIUM 8.5*  CREATININE 0.71  GFRNONAA >60  GFRAA >60    LIVER FUNCTION TESTS:  Recent Labs  05/15/15 1625  BILITOT 1.2  AST 69*  ALT 174*  ALKPHOS 381*  PROT 7.6  ALBUMIN 2.3*   Assessment and Plan: Left back pain x 1 month CT with ileopsoas fluid collection, concern for abscess with leukocytosis  Request for percutaneous drain placement  The patient has been NPO, no blood thinners taken, labs and vitals have been reviewed. Risks and Benefits discussed with the patient including bleeding, infection, and damage to adjacent structures. All of the patient's questions were answered, patient is agreeable to proceed. Consent signed and in chart.   Thank you for this interesting consult.  I greatly enjoyed meeting SAGE MON and look forward to  participating in their care.  SignedBerneta Levins 05/16/2015, 9:54 AM   I spent a total of 20 Minutes in face to face in clinical consultation, greater than 50% of which was counseling/coordinating care for ileopsoas fluid collection/abscess

## 2015-05-16 NOTE — Procedures (Signed)
Technically successful CT guided drainage catheter placement into caudal aspect of the left iliopsoas abscess yielding 120 cc of purulent material. Technically successful CT guided drainage catheter placement into the more cranial aspect of the left iliopsoas abscess  yielding 40 cc of purulent material.   Samples sent to laboratory from both separate fluid collections No immediate post procedural complications.

## 2015-05-16 NOTE — Progress Notes (Signed)
PROGRESS NOTE  [name]:[id number] DOB: 09/15/1976 DOA: 05/15/2015 PCP: Pcp Not In System  Assessment/Plan: Left iliopsoas abscesses.  -IR for drain -culture on fluid -denies risk factors for abscess:  denies any IV drug abuse.  -HIV pending -blood cultures pending  Elevated alkaline phosphatase, AST and LFT. This may be reflection of infection, versus an inflammatory process. Monitor closely.  Acute pain -PRN dilaudid -suspect tachy cardia related to pain  Hyponatremia -IVF  Leukocytosis -IVF and trend  Code Status: full Family Communication: no family at bedside Disposition Plan:    Consultants:  IR  Procedures:      HPI/Subjective: No SOB Bed uncomfortable  Objective: Filed Vitals:   05/16/15 0554  BP: 132/68  Pulse: 112  Temp: 99.6 F (37.6 C)  Resp: 16    Intake/Output Summary (Last 24 hours) at 05/16/15 0958 Last data filed at 05/16/15 0800  Gross per 24 hour  Intake    800 ml  Output    650 ml  Net    150 ml   Filed Weights   05/15/15 1340  Weight: 90.719 kg (200 lb)    Exam:   General:  A+Ox3, NAD  Cardiovascular: rrr  Respiratory: clear  Abdomen: +Bs, soft  Musculoskeletal: no edema   Data Reviewed: Basic Metabolic Panel:  Recent Labs Lab 05/15/15 1625  NA 129*  K 4.5  CL 92*  CO2 27  GLUCOSE 113*  BUN 15  CREATININE 0.71  CALCIUM 8.5*   Liver Function Tests:  Recent Labs Lab 05/15/15 1625  AST 69*  ALT 174*  ALKPHOS 381*  BILITOT 1.2  PROT 7.6  ALBUMIN 2.3*    Recent Labs Lab 05/15/15 1625  LIPASE 18*   No results for input(s): AMMONIA in the last 168 hours. CBC:  Recent Labs Lab 05/15/15 1625  WBC 26.3*  NEUTROABS 22.7*  HGB 11.8*  HCT 35.1*  MCV 82.6  PLT 508*   Cardiac Enzymes: No results for input(s): CKTOTAL, CKMB, CKMBINDEX, TROPONINI in the last 168 hours. BNP (last 3 results) No results for input(s): BNP in the last 8760 hours.  ProBNP (last 3 results) No  results for input(s): PROBNP in the last 8760 hours.  CBG: No results for input(s): GLUCAP in the last 168 hours.  Recent Results (from the past 240 hour(s))  Culture, blood (routine x 2)     Status: None (Preliminary result)   Collection Time: 05/15/15  4:00 PM  Result Value Ref Range Status   Specimen Description BLOOD LEFT FOREARM DRAWN BY RN  Final   Special Requests   Final    BOTTLES DRAWN AEROBIC AND ANAEROBIC AEB=6CC ANA=4CC   Culture NO GROWTH < 12 HOURS  Final   Report Status PENDING  Incomplete  Culture, blood (routine x 2)     Status: None (Preliminary result)   Collection Time: 05/15/15  4:25 PM  Result Value Ref Range Status   Specimen Description BLOOD RIGHT WRIST  Final   Special Requests BOTTLES DRAWN AEROBIC AND ANAEROBIC 8CC EACH  Final   Culture NO GROWTH < 12 HOURS  Final   Report Status PENDING  Incomplete     Studies: Dg Chest 2 View  05/15/2015   CLINICAL DATA:  Smoker. Difficulty eating with abdominal pain. Swelling in rash involving both feet. Back pain.  EXAM: CHEST  2 VIEW  COMPARISON:  None.  FINDINGS: Cardiomediastinal silhouette is within normal limits. The lungs are mildly hypoinflated without evidence of airspace consolidation, edema, pleural effusion,  or pneumothorax. No acute osseous abnormality is identified.  IMPRESSION: No active cardiopulmonary disease.   Electronically Signed   By: Sebastian Ache   On: 05/15/2015 16:51   Mr Lumbar Spine Wo Contrast  05/15/2015   CLINICAL DATA:  39 year old male with severe lumbar back pain involving the left lower extremity x1 month. Weakness.  EXAM: MRI LUMBAR SPINE WITHOUT CONTRAST  TECHNIQUE: Multiplanar, multisequence MR imaging of the lumbar spine was performed. No intravenous contrast was administered.  COMPARISON:  Lumbar spine radiographs 11/22/2012.  FINDINGS: Study is intermittently degraded by motion artifact despite repeated imaging attempts.  Normal lumbar segmentation demonstrated in 2014. Stable  vertebral height and alignment.  There is disc desiccation at T11-T12 and disc bulge which results in borderline to mild spinal stenosis. No mass effect on the lower thoracic spinal cord at this level. No signal abnormality in the lower thoracic spinal cord or conus, the tip of the conus is at L1-L2. The T12-L1 through L3-L4 discs then are normal.  There is severe edema in the medial and posterior left psoas muscle beginning at the L2-L3 level but increasing along the caudal aspect of the muscle. There is also severe edema in the visible left iliacus muscle and in the left posterior paraspinal muscles (erector spine I) beginning at the L4-L5 level and continuing inferiorly. There is some evidence of left sacral ala marrow edema (series 5, image 15).  There is a superimposed posterior epidural mass extending from the mid L4 to the mid S1 level. This measures up to 10 mm AP thickness and contributes to severe spinal stenosis at the L4-L5 and L5-S1 levels due to mass effect on the posterior thecal sac (series 7, image 31). There is chronic disc degeneration at each 1 of these levels with disc bulging and central disc herniation.  Furthermore there is abnormal presacral STIR signal (series 5, image 19).  The right side paraspinal muscles and soft tissues appear normal.  IMPRESSION: 1. Marked abnormality in the left psoas, iliacus, and lower paraspinal muscles. The epicenter seems to be the left sacral ala which appears edematous. Leading consideration at this point is septic left SI joint. In light of the patient's difficulty with MRI, CT Abdomen and Pelvis with oral and IV contrast may be most valuable at this point. 2. Widespread posterior epidural space-occupying mass might be infectious phlegmon. This contributes to severe spinal stenosis at L4-L5 and L5-S1 due to posterior thecal sac mass effect. There is underlying L4-L5 and L5-S1 disc degeneration. 3. No definite marrow edema or acute osseous abnormality in the  lumbar spine. Study discussed by telephone with Dr. Vanetta Mulders on 05/15/2015 at 1730 hr.   Electronically Signed   By: Odessa Fleming M.D.   On: 05/15/2015 18:12   Ct Abdomen Pelvis W Contrast  05/15/2015   CLINICAL DATA:  Patient with rash and swelling to the bilateral feet. Lower abdominal pain for 1 month.  EXAM: CT ABDOMEN AND PELVIS WITH CONTRAST  TECHNIQUE: Multidetector CT imaging of the abdomen and pelvis was performed using the standard protocol following bolus administration of intravenous contrast.  CONTRAST:  OMNIPAQUE IOHEXOL 300 MG/ML SOLN, 25mL OMNIPAQUE IOHEXOL 300 MG/ML SOLN  COMPARISON:  MRI lumbar spine 05/15/2015  FINDINGS: Lower chest: Normal heart size. No consolidative or nodular pulmonary opacities. No pleural effusion.  Hepatobiliary: Liver is normal in size and contour without focal hepatic lesion identified. Gallbladder is decompressed. No intrahepatic or extrahepatic biliary ductal dilatation.  Pancreas: Unremarkable  Spleen: Multiple calcified  granulomata.  Adrenals/Urinary Tract: Adrenal glands are normal. Kidneys enhance symmetrically with contrast. No hydronephrosis.  Stomach/Bowel: No abnormal bowel wall thickening or evidence for bowel obstruction. The appendix is normal. No free intraperitoneal air.  Vascular/Lymphatic: There is significant mass effect on the left internal and external iliac veins from adjacent loculated fluid collection. Normal caliber abdominal aorta.  Other: None  Musculoskeletal: There is expansion of left psoas muscle from the approximate L2 vertebral level extending caudally were there is also expansion of the iliacus muscle. Near the junction there are multiple adjacent loculated appearing rim enhancing fluid collections with at least 1 of these collections appearing to contain small foci of gas. Collection within the left iliacus musculature measures 4.9 x 4.5 x 7.8 cm (image 67; series 2). Collections appear to extend into the left pelvic sidewall  musculature. There appears to be some fat stranding about the proximal left lower extremity musculature. No definite osseous cortical destruction identified either involving the lower lumbar spine or pelvis.  IMPRESSION: Abnormal thickening and expansion of the left ileopsoas musculature which appears to contain multiple rim enhancing gas containing fluid collections most compatible with abscesses. It is not clear the exact origin of this apparent infectious process. No definite evidence for osseous erosion of the lumbar spine or pelvis on CT imaging.  The muscular expansion and abscesses exert significant mass effect on the left internal and external iliac veins.  Left hemi-pelvic and inguinal adenopathy likely reactive.   Electronically Signed   By: Annia Belt M.D.   On: 05/15/2015 19:29    Scheduled Meds: . piperacillin-tazobactam (ZOSYN)  IV  3.375 g Intravenous Q8H  . [START ON 05/17/2015] pneumococcal 23 valent vaccine  0.5 mL Intramuscular Tomorrow-1000  . vancomycin  1,000 mg Intravenous Q8H   Continuous Infusions: . sodium chloride 100 mL/hr at 05/16/15 0244   Antibiotics Given (last 72 hours)    Date/Time Action Medication Dose Rate   05/16/15 0527 Given   piperacillin-tazobactam (ZOSYN) IVPB 3.375 g 3.375 g 12.5 mL/hr   05/16/15 0949 Given   vancomycin (VANCOCIN) IVPB 1000 mg/200 mL premix 1,000 mg 200 mL/hr      Active Problems:   Iliopsoas abscess   Leukocytosis   Hyponatremia    Time spent: 25 min    [name]  [facility] Pager [phone]. If 7PM-7AM, please contact night-coverage at [email or web address], password [facility] 05/16/2015, 9:58 AM  LOS: 1 day

## 2015-05-17 ENCOUNTER — Inpatient Hospital Stay (HOSPITAL_COMMUNITY): Payer: Medicaid Other

## 2015-05-17 DIAGNOSIS — B9562 Methicillin resistant Staphylococcus aureus infection as the cause of diseases classified elsewhere: Secondary | ICD-10-CM

## 2015-05-17 DIAGNOSIS — K6812 Psoas muscle abscess: Principal | ICD-10-CM

## 2015-05-17 DIAGNOSIS — R7881 Bacteremia: Secondary | ICD-10-CM

## 2015-05-17 LAB — COMPREHENSIVE METABOLIC PANEL
ALT: 120 U/L — ABNORMAL HIGH (ref 17–63)
ANION GAP: 7 (ref 5–15)
AST: 60 U/L — ABNORMAL HIGH (ref 15–41)
Albumin: 1.7 g/dL — ABNORMAL LOW (ref 3.5–5.0)
Alkaline Phosphatase: 341 U/L — ABNORMAL HIGH (ref 38–126)
BILIRUBIN TOTAL: 0.9 mg/dL (ref 0.3–1.2)
BUN: 8 mg/dL (ref 6–20)
CHLORIDE: 98 mmol/L — AB (ref 101–111)
CO2: 27 mmol/L (ref 22–32)
Calcium: 7.7 mg/dL — ABNORMAL LOW (ref 8.9–10.3)
Creatinine, Ser: 0.61 mg/dL (ref 0.61–1.24)
GFR calc Af Amer: >60 mL/min (ref >60)
GFR calc non Af Amer: >60 mL/min (ref >60)
Glucose, Bld: 107 mg/dL — ABNORMAL HIGH (ref 65–99)
Potassium: 4 mmol/L (ref 3.5–5.1)
SODIUM: 132 mmol/L — AB (ref 135–145)
Total Protein: 6.2 g/dL — ABNORMAL LOW (ref 6.5–8.1)

## 2015-05-17 LAB — CBC
HEMATOCRIT: 27.9 % — AB (ref 39.0–52.0)
HEMOGLOBIN: 9.3 g/dL — AB (ref 13.0–17.0)
MCH: 27.1 pg (ref 26.0–34.0)
MCHC: 33.3 g/dL (ref 30.0–36.0)
MCV: 81.3 fL (ref 78.0–100.0)
PLATELETS: 446 10*3/uL — AB (ref 150–400)
RBC: 3.43 MIL/uL — AB (ref 4.22–5.81)
RDW: 14.4 % (ref 11.5–15.5)
WBC: 17.6 10*3/uL — ABNORMAL HIGH (ref 4.0–10.5)

## 2015-05-17 LAB — HIV ANTIBODY (ROUTINE TESTING W REFLEX): HIV Screen 4th Generation wRfx: NONREACTIVE

## 2015-05-17 MED ORDER — OXYCODONE HCL 5 MG PO TABS
10.0000 mg | ORAL_TABLET | ORAL | Status: DC | PRN
  Administered 2015-05-17 – 2015-05-23 (×32): 20 mg via ORAL
  Filled 2015-05-17 (×7): qty 4
  Filled 2015-05-17: qty 2
  Filled 2015-05-17 (×20): qty 4
  Filled 2015-05-17: qty 2
  Filled 2015-05-17 (×4): qty 4

## 2015-05-17 MED ORDER — HYDROMORPHONE HCL 1 MG/ML IJ SOLN
1.0000 mg | Freq: Once | INTRAMUSCULAR | Status: AC
  Administered 2015-05-17: 1 mg via INTRAVENOUS

## 2015-05-17 MED ORDER — HYDROMORPHONE HCL 1 MG/ML IJ SOLN
1.5000 mg | INTRAMUSCULAR | Status: DC | PRN
Start: 2015-05-17 — End: 2015-05-17
  Administered 2015-05-17: 1.5 mg via INTRAVENOUS
  Filled 2015-05-17: qty 2

## 2015-05-17 MED ORDER — HYDROMORPHONE HCL 1 MG/ML IJ SOLN
1.0000 mg | INTRAMUSCULAR | Status: DC | PRN
  Administered 2015-05-17 – 2015-05-23 (×34): 1 mg via INTRAVENOUS
  Filled 2015-05-17 (×35): qty 1

## 2015-05-17 MED ORDER — HYDROMORPHONE HCL 1 MG/ML IJ SOLN
2.0000 mg | Freq: Once | INTRAMUSCULAR | Status: AC
  Administered 2015-05-17: 2 mg via INTRAVENOUS
  Filled 2015-05-17: qty 2

## 2015-05-17 MED ORDER — NAPROXEN 250 MG PO TABS
500.0000 mg | ORAL_TABLET | Freq: Two times a day (BID) | ORAL | Status: DC
  Administered 2015-05-17 – 2015-05-19 (×6): 500 mg via ORAL
  Filled 2015-05-17 (×6): qty 2

## 2015-05-17 MED ORDER — OXYCODONE HCL 5 MG PO TABS
5.0000 mg | ORAL_TABLET | ORAL | Status: DC | PRN
  Administered 2015-05-17: 10 mg via ORAL
  Filled 2015-05-17 (×2): qty 2

## 2015-05-17 NOTE — Progress Notes (Signed)
Patient stated that pain is still a 10/10 after several administrations of dilaudid 1 mg and a dose of roxy PO 10 mg at 2200. Gaspar Skeeters NP to see if adjustments could made to pain regimen. Orders received. Upon entering room, noted is heavy of cigar or cigarette smell. Inquired if patient had been smoking. Patient stated that he did earlier but the nurse told him not to do so around 1800 and he had not done so since. Patient seemed visibly upset afterwards. Notified him of new pain regimen and gave him apple juice. Will continue to monitor.

## 2015-05-17 NOTE — Progress Notes (Signed)
Patient is upset about pain relief, however does not want roxy PO because he says that they do not work. Explained to patient that it would increase his pain relief to take PO meds along with IV medication since IV medication is short-acting. Patient refused, assessed drains to see if they needed to be emptied and checked dressing to see if c/d/i. Will continue to monitor.

## 2015-05-17 NOTE — Progress Notes (Signed)
Patient ID: [name], male   DOB: 1976-06-10, 39 y.o.   MRN: [id number]   LOS: 2 days   Subjective: Pain no better but has moved from flank to abdomen.   Objective: Vital signs in last 24 hours: Temp:  [99.1 F (37.3 C)-99.8 F (37.7 C)] 99.5 F (37.5 C) (06/25 0513) Pulse Rate:  [88-105] 95 (06/25 0513) Resp:  [12-25] 17 (06/25 0513) BP: (130-147)/(66-86) 130/74 mmHg (06/25 0513) SpO2:  [94 %-100 %] 94 % (06/25 0513) Last BM Date: 05/16/15   Laboratory  CBC  Recent Labs  05/15/15 1625 05/17/15 0520  WBC 26.3* 17.6*  HGB 11.8* 9.3*  HCT 35.1* 27.9*  PLT 508* 446*   BMET  Recent Labs  05/15/15 1625 05/17/15 0520  NA 129* 132*  K 4.5 4.0  CL 92* 98*  CO2 27 27  GLUCOSE 113* 107*  BUN 15 8  CREATININE 0.71 0.61  CALCIUM 8.5* 7.7*    Physical Exam General appearance: alert and no distress Resp: clear to auscultation bilaterally Cardio: regular rate and rhythm GI: normal findings: bowel sounds normal and soft, non-tender   Assessment/Plan: Iliopsoas abscess -- Cultures pending, WBC improved, afebrile. Vanc D2. CPM. Increase OxyIR range.    Freeman Caldron, PA-C Pager: [phone] 05/17/2015

## 2015-05-17 NOTE — Progress Notes (Signed)
Patient is screaming in pain and becoming increasingly upset over pain relief but refuses PO medication, notifed Lynch NP for further evaluation. Will continue to monitor.

## 2015-05-17 NOTE — Progress Notes (Signed)
  Echocardiogram 2D Echocardiogram has been performed.  Cathie Beams 05/17/2015, 2:58 PM

## 2015-05-17 NOTE — Progress Notes (Signed)
Referring Physician(s): TRH  Subjective:  Left iliopsoas abscess  LLQ drains x 2 placed 6/24 Good output Feels better Sites sore  Allergies: Vicodin and Adhesive  Medications: Prior to Admission medications   Medication Sig Start Date End Date Taking? Authorizing Provider  diphenhydramine-acetaminophen (TYLENOL PM) 25-500 MG TABS Take 1 tablet by mouth at bedtime as needed.   Yes Historical Provider, MD  cyclobenzaprine (FLEXERIL) 5 MG tablet Take 1 tablet (5 mg total) by mouth 3 (three) times daily as needed for muscle spasms. Patient not taking: Reported on 05/15/2015 05/04/15   Myra Rude, MD  meloxicam (MOBIC) 7.5 MG tablet Take 1 tablet (7.5 mg total) by mouth daily. Patient not taking: Reported on 05/15/2015 05/04/15   Myra Rude, MD  predniSONE (DELTASONE) 10 MG tablet Take 2 tablets (20 mg total) by mouth 2 (two) times daily. Patient not taking: Reported on 05/15/2015 05/05/15   Geoffery Lyons, MD     Vital Signs: BP 130/74 mmHg  Pulse 95  Temp(Src) 99.5 F (37.5 C) (Oral)  Resp 17  Ht 6' (1.829 m)  Wt 200 lb (90.719 kg)  BMI 27.12 kg/m2  SpO2 94%  Physical Exam  Abdominal:  LLQ abscess drains intact Output #1 135 cc #2 output 150 cc yesterday Both with 5-10 cc blood tinged pus like material  Sites are clean and dry Sl tender No bleeding  Low grade 99.5 Wbc 17.6 (26.3)   Nursing note and vitals reviewed.   Imaging: Dg Chest 2 View  05/15/2015   CLINICAL DATA:  Smoker. Difficulty eating with abdominal pain. Swelling in rash involving both feet. Back pain.  EXAM: CHEST  2 VIEW  COMPARISON:  None.  FINDINGS: Cardiomediastinal silhouette is within normal limits. The lungs are mildly hypoinflated without evidence of airspace consolidation, edema, pleural effusion, or pneumothorax. No acute osseous abnormality is identified.  IMPRESSION: No active cardiopulmonary disease.   Electronically Signed   By: Sebastian Ache   On: 05/15/2015 16:51   Mr  Lumbar Spine Wo Contrast  05/15/2015   CLINICAL DATA:  39 year old male with severe lumbar back pain involving the left lower extremity x1 month. Weakness.  EXAM: MRI LUMBAR SPINE WITHOUT CONTRAST  TECHNIQUE: Multiplanar, multisequence MR imaging of the lumbar spine was performed. No intravenous contrast was administered.  COMPARISON:  Lumbar spine radiographs 11/22/2012.  FINDINGS: Study is intermittently degraded by motion artifact despite repeated imaging attempts.  Normal lumbar segmentation demonstrated in 2014. Stable vertebral height and alignment.  There is disc desiccation at T11-T12 and disc bulge which results in borderline to mild spinal stenosis. No mass effect on the lower thoracic spinal cord at this level. No signal abnormality in the lower thoracic spinal cord or conus, the tip of the conus is at L1-L2. The T12-L1 through L3-L4 discs then are normal.  There is severe edema in the medial and posterior left psoas muscle beginning at the L2-L3 level but increasing along the caudal aspect of the muscle. There is also severe edema in the visible left iliacus muscle and in the left posterior paraspinal muscles (erector spine I) beginning at the L4-L5 level and continuing inferiorly. There is some evidence of left sacral ala marrow edema (series 5, image 15).  There is a superimposed posterior epidural mass extending from the mid L4 to the mid S1 level. This measures up to 10 mm AP thickness and contributes to severe spinal stenosis at the L4-L5 and L5-S1 levels due to mass effect on the posterior  thecal sac (series 7, image 31). There is chronic disc degeneration at each 1 of these levels with disc bulging and central disc herniation.  Furthermore there is abnormal presacral STIR signal (series 5, image 19).  The right side paraspinal muscles and soft tissues appear normal.  IMPRESSION: 1. Marked abnormality in the left psoas, iliacus, and lower paraspinal muscles. The epicenter seems to be the left  sacral ala which appears edematous. Leading consideration at this point is septic left SI joint. In light of the patient's difficulty with MRI, CT Abdomen and Pelvis with oral and IV contrast may be most valuable at this point. 2. Widespread posterior epidural space-occupying mass might be infectious phlegmon. This contributes to severe spinal stenosis at L4-L5 and L5-S1 due to posterior thecal sac mass effect. There is underlying L4-L5 and L5-S1 disc degeneration. 3. No definite marrow edema or acute osseous abnormality in the lumbar spine. Study discussed by telephone with Dr. Vanetta Mulders on 05/15/2015 at 1730 hr.   Electronically Signed   By: Odessa Fleming M.D.   On: 05/15/2015 18:12   Ct Abdomen Pelvis W Contrast  05/15/2015   CLINICAL DATA:  Patient with rash and swelling to the bilateral feet. Lower abdominal pain for 1 month.  EXAM: CT ABDOMEN AND PELVIS WITH CONTRAST  TECHNIQUE: Multidetector CT imaging of the abdomen and pelvis was performed using the standard protocol following bolus administration of intravenous contrast.  CONTRAST:  OMNIPAQUE IOHEXOL 300 MG/ML SOLN, 25mL OMNIPAQUE IOHEXOL 300 MG/ML SOLN  COMPARISON:  MRI lumbar spine 05/15/2015  FINDINGS: Lower chest: Normal heart size. No consolidative or nodular pulmonary opacities. No pleural effusion.  Hepatobiliary: Liver is normal in size and contour without focal hepatic lesion identified. Gallbladder is decompressed. No intrahepatic or extrahepatic biliary ductal dilatation.  Pancreas: Unremarkable  Spleen: Multiple calcified granulomata.  Adrenals/Urinary Tract: Adrenal glands are normal. Kidneys enhance symmetrically with contrast. No hydronephrosis.  Stomach/Bowel: No abnormal bowel wall thickening or evidence for bowel obstruction. The appendix is normal. No free intraperitoneal air.  Vascular/Lymphatic: There is significant mass effect on the left internal and external iliac veins from adjacent loculated fluid collection. Normal  caliber abdominal aorta.  Other: None  Musculoskeletal: There is expansion of left psoas muscle from the approximate L2 vertebral level extending caudally were there is also expansion of the iliacus muscle. Near the junction there are multiple adjacent loculated appearing rim enhancing fluid collections with at least 1 of these collections appearing to contain small foci of gas. Collection within the left iliacus musculature measures 4.9 x 4.5 x 7.8 cm (image 67; series 2). Collections appear to extend into the left pelvic sidewall musculature. There appears to be some fat stranding about the proximal left lower extremity musculature. No definite osseous cortical destruction identified either involving the lower lumbar spine or pelvis.  IMPRESSION: Abnormal thickening and expansion of the left ileopsoas musculature which appears to contain multiple rim enhancing gas containing fluid collections most compatible with abscesses. It is not clear the exact origin of this apparent infectious process. No definite evidence for osseous erosion of the lumbar spine or pelvis on CT imaging.  The muscular expansion and abscesses exert significant mass effect on the left internal and external iliac veins.  Left hemi-pelvic and inguinal adenopathy likely reactive.   Electronically Signed   By: Annia Belt M.D.   On: 05/15/2015 19:29   Ct Image Guided Drainage By Percutaneous Catheter  05/16/2015   INDICATION: History of back pain for 1 month  with preceding lumbar spine MRI and contrast-enhanced abdominal CT demonstrating a large left sided psoas and iliacus abscess. Please perform CT-guided percutaneous drainage catheter placement.  EXAM: CT IMAGE GUIDED DRAINAGE BY PERCUTANEOUS CATHETER x2  COMPARISON:  Lumbar spine MRI - 05/15/2015; CT abdomen pelvis - 05/15/2015  MEDICATIONS: The patient is currently admitted to the hospital and receiving intravenous antibiotics. The antibiotics were administered within an appropriate time  frame prior to the initiation of the procedure.  ANESTHESIA/SEDATION: Fentanyl 200 mcg IV; Versed 3 mg IV  Total Moderate Sedation time  30 minutes  CONTRAST:  None  COMPLICATIONS: None immediate  PROCEDURE: Informed written consent was obtained from the patient after a discussion of the risks, benefits and alternatives to treatment. The patient was placed supine on the CT gantry and a pre procedural CT was performed re-demonstrating the known abscess/fluid collection within the caudal aspect of the iliopsoas and iliacus musculature with dominant cranial component measuring approximately 5.1 x 6.0 cm (image 22, series 2) and dominant caudal component measuring approximately 5.9 x 3.9 cm (image 36, series 2). The procedure was planned. A timeout was performed prior to the initiation of the procedure.  The skin overlying the anterior aspect of the left lower abdomen was prepped and draped in the usual sterile fashion.  Attention was initially paid towards the dominant caudal component located primarily within in the caudal aspect of the iliopsoas musculature. The overlying soft tissues were anesthetized with 1% lidocaine with epinephrine. Appropriate trajectory was planned with the use of a 22 gauge spinal needle. An 18 gauge trocar needle was advanced into the abscess/fluid collection and a short Amplatz super stiff wire was coiled within the collection. Appropriate positioning was confirmed with a limited CT scan. The tract was serially dilated allowing placement of a 10 [place] all-purpose drainage catheter. Appropriate positioning was confirmed with a limited postprocedural CT scan.  A total of approximately 110 cc of purulent fluid was aspirated. The tube was connected to a JP bulb and sutured in place.  Following the placement of the initial percutaneous drainage catheter, CT imaging was performed demonstrating grossly unchanged size and appearance of the separate dominant component located within the iliacus  musculature and as such the decision was made to place an additional percutaneous drainage catheter.  The overlying soft tissues were anesthetized with 1% lidocaine with epinephrine. The overlying soft tissues were anesthetized with 1% lidocaine with epinephrine. Appropriate trajectory was planned with the use of a 22 gauge spinal needle. An 18 gauge trocar needle was advanced into the abscess/fluid collection and a short Amplatz super stiff wire was coiled within the collection. Appropriate positioning was confirmed with a limited CT scan. The tract was serially dilated allowing placement of a 10 [place] all-purpose drainage catheter. Appropriate positioning was confirmed with a limited postprocedural CT scan.  A total of approximately 40 cc of purulent material was aspirated from the separate fluid collection. The tube was connected to a JP bulb and sutured in place.  Dressings were placed. The patient tolerated the procedure well without immediate postprocedural complication.  IMPRESSION: 1. Successful CT guided placement of a 10 [place] all purpose drain catheter into the caudal aspect of the left iliopsoas musculature with aspiration of approximately 110 mL of purulent fluid. Samples were sent to the laboratory as requested by the ordering clinical team. 2. Successful CT-guided placement of a 10 [place] all-purpose drainage catheter into the dominant component within the left iliacus musculature with aspiration of approximately 40 cc of purulent  material. Samples were sent to the laboratory as requested by the ordering clinical team.   Electronically Signed   By: Simonne Come M.D.   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 Please perform CT-guided percutaneous drainage catheter placement.  EXAM: CT  IMAGE GUIDED DRAINAGE BY PERCUTANEOUS CATHETER x2  COMPARISON:  Lumbar spine MRI - 05/15/2015; CT abdomen pelvis - 05/15/2015  MEDICATIONS: The patient is currently admitted to the hospital and receiving intravenous antibiotics. The antibiotics were administered within an appropriate time frame prior to the initiation of the procedure.  ANESTHESIA/SEDATION: Fentanyl 200 mcg IV; Versed 3 mg IV  Total Moderate Sedation time  30 minutes  CONTRAST:  None  COMPLICATIONS: None immediate  PROCEDURE: Informed written consent was obtained from the patient after a discussion of the risks, benefits and alternatives to treatment. The patient was placed supine on the CT gantry and a pre procedural CT was performed re-demonstrating the known abscess/fluid collection within the caudal aspect of the iliopsoas and iliacus musculature with dominant cranial component measuring approximately 5.1 x 6.0 cm (image 22, series 2) and dominant caudal component measuring approximately 5.9 x 3.9 cm (image 36, series 2). The procedure was planned. A timeout was performed prior to the initiation of the procedure.  The skin overlying the anterior aspect of the left lower abdomen was prepped and draped in the usual sterile fashion.  Attention was initially paid towards the dominant caudal component located primarily within in the caudal aspect of the iliopsoas musculature. The overlying soft tissues were anesthetized with 1% lidocaine with epinephrine. Appropriate trajectory was planned with the use of a 22 gauge spinal needle. An 18 gauge trocar needle was advanced into the abscess/fluid collection and a short Amplatz super stiff wire was coiled within the collection. Appropriate positioning was confirmed with a limited CT scan. The tract was serially dilated allowing placement of a 10 [place] all-purpose drainage catheter. Appropriate positioning was confirmed with a limited postprocedural CT scan.  A total of approximately 110 cc of purulent  fluid was aspirated. The tube was connected to a JP bulb and sutured in place.  Following the placement of the initial percutaneous drainage catheter, CT imaging was performed demonstrating grossly unchanged size and appearance of the separate dominant component located within the iliacus musculature and as such the decision was made to place an additional percutaneous drainage catheter.  The overlying soft tissues were anesthetized with 1% lidocaine with epinephrine. The overlying soft tissues were anesthetized with 1% lidocaine with epinephrine. Appropriate trajectory was planned with the use of a 22 gauge spinal needle. An 18 gauge trocar needle was advanced into the abscess/fluid collection and a short Amplatz super stiff wire was coiled within the collection. Appropriate positioning was confirmed with a limited CT scan. The tract was serially dilated allowing placement of a 10 [place] all-purpose drainage catheter. Appropriate positioning was confirmed with a limited postprocedural CT scan.  A total of approximately 40 cc of purulent material was aspirated from the separate fluid collection. The tube was connected to a JP bulb and sutured in place.  Dressings were placed. The patient tolerated the procedure well without immediate postprocedural complication.  IMPRESSION: 1. Successful CT guided placement of a 10 French all purpose drain catheter into the caudal aspect of the left iliopsoas  musculature with aspiration of approximately 110 mL of purulent fluid. Samples were sent to the laboratory as requested by the ordering clinical team. 2. Successful CT-guided placement of a 10 French all-purpose drainage catheter into the dominant component within the left iliacus musculature with aspiration of approximately 40 cc of purulent material. Samples were sent to the laboratory as requested by the ordering clinical team.   Electronically Signed   By: Simonne Come M.D.   On: 05/16/2015 16:42     Labs:  CBC:  Recent Labs  05/15/15 1625 05/17/15 0520  WBC 26.3* 17.6*  HGB 11.8* 9.3*  HCT 35.1* 27.9*  PLT 508* 446*    COAGS:  Recent Labs  05/15/15 1625  INR 1.33    BMP:  Recent Labs  05/15/15 1625 05/17/15 0520  NA 129* 132*  K 4.5 4.0  CL 92* 98*  CO2 27 27  GLUCOSE 113* 107*  BUN 15 8  CALCIUM 8.5* 7.7*  CREATININE 0.71 0.61  GFRNONAA >60 >60  GFRAA >60 >60    LIVER FUNCTION TESTS:  Recent Labs  05/15/15 1625 05/17/15 0520  BILITOT 1.2 0.9  AST 69* 60*  ALT 174* 120*  ALKPHOS 381* 341*  PROT 7.6 6.2*  ALBUMIN 2.3* 1.7*    Assessment and Plan:  LLQ iliopsoas abscess drains in place Will follow Will need Re CT when output minimal- 5-10 cc /daily Be sure to flush drains and record output  Signed: Rushie Brazel A 05/17/2015, 8:59 AM   I spent a total of 15 Minutes in face to face in clinical consultation/evaluation, greater than 50% of which was counseling/coordinating care for iliopsoas abscess

## 2015-05-17 NOTE — Progress Notes (Signed)
PROGRESS NOTE  [name] NWG:[id number] DOB: November 29, 1975 DOA: 05/15/2015 PCP: Pcp Not In System  Assessment/Plan: Left iliopsoas abscesses.  -IR for drain -culture  -denies risk factors for abscess:  denies any IV drug abuse.  -HIV non-reactive -final blood cultures pending  Gram + cocci bacteremia -IV vanc -consult ID  Elevated alkaline phosphatase, AST and LFT. This may be reflection of infection, versus an inflammatory process. Monitor closely.  Acute pain -PRN dilaudid/oxy  Hyponatremia -IVF  Leukocytosis -improving -IVF and trend  Nicotine abuse -told not to smoke in room   Code Status: full Family Communication: no family at bedside Disposition Plan:    Consultants:  IR  Procedures: CT IMAGE GUIDED DRAINAGE BY PERCUTANEOUS CATHETER [    HPI/Subjective: Smoking in room yesterday says he always has abscesses on skin and goes to ER to have drained   Objective: Filed Vitals:   05/17/15 0513  BP: 130/74  Pulse: 95  Temp: 99.5 F (37.5 C)  Resp: 17    Intake/Output Summary (Last 24 hours) at 05/17/15 0752 Last data filed at 05/17/15 0644  Gross per 24 hour  Intake   1905 ml  Output   1386 ml  Net    519 ml   Filed Weights   05/15/15 1340  Weight: 90.719 kg (200 lb)    Exam:   General:  A+Ox3, NAD  Cardiovascular: rrr  Respiratory: clear  Abdomen: +Bs, soft  Musculoskeletal: redness in b/l feet resolved  Data Reviewed: Basic Metabolic Panel:  Recent Labs Lab 05/15/15 1625 05/17/15 0520  NA 129* 132*  K 4.5 4.0  CL 92* 98*  CO2 27 27  GLUCOSE 113* 107*  BUN 15 8  CREATININE 0.71 0.61  CALCIUM 8.5* 7.7*   Liver Function Tests:  Recent Labs Lab 05/15/15 1625 05/17/15 0520  AST 69* 60*  ALT 174* 120*  ALKPHOS 381* 341*  BILITOT 1.2 0.9  PROT 7.6 6.2*  ALBUMIN 2.3* 1.7*    Recent Labs Lab 05/15/15 1625  LIPASE 18*   No results for input(s): AMMONIA in the last 168 hours. CBC:  Recent Labs Lab  05/15/15 1625 05/17/15 0520  WBC 26.3* 17.6*  NEUTROABS 22.7*  --   HGB 11.8* 9.3*  HCT 35.1* 27.9*  MCV 82.6 81.3  PLT 508* 446*   Cardiac Enzymes: No results for input(s): CKTOTAL, CKMB, CKMBINDEX, TROPONINI in the last 168 hours. BNP (last 3 results) No results for input(s): BNP in the last 8760 hours.  ProBNP (last 3 results) No results for input(s): PROBNP in the last 8760 hours.  CBG: No results for input(s): GLUCAP in the last 168 hours.  Recent Results (from the past 240 hour(s))  Culture, blood (routine x 2)     Status: None (Preliminary result)   Collection Time: 05/15/15  4:00 PM  Result Value Ref Range Status   Specimen Description BLOOD LEFT FOREARM DRAWN BY RN  Final   Special Requests   Final    BOTTLES DRAWN AEROBIC AND ANAEROBIC AEB=6CC ANA=4CC   Culture NO GROWTH < 12 HOURS  Final   Report Status PENDING  Incomplete  Urine culture     Status: None (Preliminary result)   Collection Time: 05/15/15  4:13 PM  Result Value Ref Range Status   Specimen Description URINE, CLEAN CATCH  Final   Special Requests NONE  Final   Culture   Final    TOO YOUNG TO READ Performed at [facility]    Report Status PENDING  Incomplete  Culture, blood (routine x 2)     Status: None (Preliminary result)   Collection Time: 05/15/15  4:25 PM  Result Value Ref Range Status   Specimen Description BLOOD RIGHT WRIST  Final   Special Requests BOTTLES DRAWN AEROBIC AND ANAEROBIC 8CC EACH  Final   Culture  Setup Time   Final    GRAM POSITIVE COCCI IN CLUSTERS AEROBIC BOTTLE ONLY CRITICAL RESULT CALLED TO, READ BACK BY AND VERIFIED WITH: HUDSON K. AT [facility] AT 1043A ON [id number] BY THOMPSON S. Performed at [facility]    Culture PENDING  Incomplete   Report Status PENDING  Incomplete  MRSA PCR Screening     Status: Abnormal   Collection Time: 05/16/15  9:08 AM  Result Value Ref Range Status   MRSA by PCR POSITIVE (A) NEGATIVE Final    Comment: RESULT CALLED TO, READ  BACK BY AND VERIFIED WITH: C. HUDSON RN 10:50 05/16/15 ([email or web address])        The GeneXpert MRSA Assay (FDA approved for NASAL specimens only), is one component of a comprehensive MRSA colonization surveillance program. It is not intended to diagnose MRSA infection nor to guide or monitor treatment for MRSA infections.      Studies: Dg Chest 2 View  05/15/2015   CLINICAL DATA:  Smoker. Difficulty eating with abdominal pain. Swelling in rash involving both feet. Back pain.  EXAM: CHEST  2 VIEW  COMPARISON:  None.  FINDINGS: Cardiomediastinal silhouette is within normal limits. The lungs are mildly hypoinflated without evidence of airspace consolidation, edema, pleural effusion, or pneumothorax. No acute osseous abnormality is identified.  IMPRESSION: No active cardiopulmonary disease.   Electronically Signed   By: Sebastian Ache   On: 05/15/2015 16:51   Mr Lumbar Spine Wo Contrast  05/15/2015   CLINICAL DATA:  39 year old male with severe lumbar back pain involving the left lower extremity x1 month. Weakness.  EXAM: MRI LUMBAR SPINE WITHOUT CONTRAST  TECHNIQUE: Multiplanar, multisequence MR imaging of the lumbar spine was performed. No intravenous contrast was administered.  COMPARISON:  Lumbar spine radiographs 11/22/2012.  FINDINGS: Study is intermittently degraded by motion artifact despite repeated imaging attempts.  Normal lumbar segmentation demonstrated in 2014. Stable vertebral height and alignment.  There is disc desiccation at T11-T12 and disc bulge which results in borderline to mild spinal stenosis. No mass effect on the lower thoracic spinal cord at this level. No signal abnormality in the lower thoracic spinal cord or conus, the tip of the conus is at L1-L2. The T12-L1 through L3-L4 discs then are normal.  There is severe edema in the medial and posterior left psoas muscle beginning at the L2-L3 level but increasing along the caudal aspect of the muscle. There is also severe edema in the  visible left iliacus muscle and in the left posterior paraspinal muscles (erector spine I) beginning at the L4-L5 level and continuing inferiorly. There is some evidence of left sacral ala marrow edema (series 5, image 15).  There is a superimposed posterior epidural mass extending from the mid L4 to the mid S1 level. This measures up to 10 mm AP thickness and contributes to severe spinal stenosis at the L4-L5 and L5-S1 levels due to mass effect on the posterior thecal sac (series 7, image 31). There is chronic disc degeneration at each 1 of these levels with disc bulging and central disc herniation.  Furthermore there is abnormal presacral STIR signal (series 5, image 19).  The right side paraspinal muscles and  soft tissues appear normal.  IMPRESSION: 1. Marked abnormality in the left psoas, iliacus, and lower paraspinal muscles. The epicenter seems to be the left sacral ala which appears edematous. Leading consideration at this point is septic left SI joint. In light of the patient's difficulty with MRI, CT Abdomen and Pelvis with oral and IV contrast may be most valuable at this point. 2. Widespread posterior epidural space-occupying mass might be infectious phlegmon. This contributes to severe spinal stenosis at L4-L5 and L5-S1 due to posterior thecal sac mass effect. There is underlying L4-L5 and L5-S1 disc degeneration. 3. No definite marrow edema or acute osseous abnormality in the lumbar spine. Study discussed by telephone with Dr. Vanetta Mulders on 05/15/2015 at 1730 hr.   Electronically Signed   By: Odessa Fleming M.D.   On: 05/15/2015 18:12   Ct Abdomen Pelvis W Contrast  05/15/2015   CLINICAL DATA:  Patient with rash and swelling to the bilateral feet. Lower abdominal pain for 1 month.  EXAM: CT ABDOMEN AND PELVIS WITH CONTRAST  TECHNIQUE: Multidetector CT imaging of the abdomen and pelvis was performed using the standard protocol following bolus administration of intravenous contrast.  CONTRAST:   OMNIPAQUE IOHEXOL 300 MG/ML SOLN, 25mL OMNIPAQUE IOHEXOL 300 MG/ML SOLN  COMPARISON:  MRI lumbar spine 05/15/2015  FINDINGS: Lower chest: Normal heart size. No consolidative or nodular pulmonary opacities. No pleural effusion.  Hepatobiliary: Liver is normal in size and contour without focal hepatic lesion identified. Gallbladder is decompressed. No intrahepatic or extrahepatic biliary ductal dilatation.  Pancreas: Unremarkable  Spleen: Multiple calcified granulomata.  Adrenals/Urinary Tract: Adrenal glands are normal. Kidneys enhance symmetrically with contrast. No hydronephrosis.  Stomach/Bowel: No abnormal bowel wall thickening or evidence for bowel obstruction. The appendix is normal. No free intraperitoneal air.  Vascular/Lymphatic: There is significant mass effect on the left internal and external iliac veins from adjacent loculated fluid collection. Normal caliber abdominal aorta.  Other: None  Musculoskeletal: There is expansion of left psoas muscle from the approximate L2 vertebral level extending caudally were there is also expansion of the iliacus muscle. Near the junction there are multiple adjacent loculated appearing rim enhancing fluid collections with at least 1 of these collections appearing to contain small foci of gas. Collection within the left iliacus musculature measures 4.9 x 4.5 x 7.8 cm (image 67; series 2). Collections appear to extend into the left pelvic sidewall musculature. There appears to be some fat stranding about the proximal left lower extremity musculature. No definite osseous cortical destruction identified either involving the lower lumbar spine or pelvis.  IMPRESSION: Abnormal thickening and expansion of the left ileopsoas musculature which appears to contain multiple rim enhancing gas containing fluid collections most compatible with abscesses. It is not clear the exact origin of this apparent infectious process. No definite evidence for osseous erosion of the lumbar spine or  pelvis on CT imaging.  The muscular expansion and abscesses exert significant mass effect on the left internal and external iliac veins.  Left hemi-pelvic and inguinal adenopathy likely reactive.   Electronically Signed   By: Annia Belt M.D.   On: 05/15/2015 19:29   Ct Image Guided Drainage By Percutaneous Catheter  05/16/2015   INDICATION: History of back pain for 1 month with preceding lumbar spine MRI and contrast-enhanced abdominal CT demonstrating a large left sided psoas and iliacus abscess. Please perform CT-guided percutaneous drainage catheter placement.  EXAM: CT IMAGE GUIDED DRAINAGE BY PERCUTANEOUS CATHETER x2  COMPARISON:  Lumbar spine MRI - 05/15/2015;  CT abdomen pelvis - 05/15/2015  MEDICATIONS: The patient is currently admitted to the hospital and receiving intravenous antibiotics. The antibiotics were administered within an appropriate time frame prior to the initiation of the procedure.  ANESTHESIA/SEDATION: Fentanyl 200 mcg IV; Versed 3 mg IV  Total Moderate Sedation time  30 minutes  CONTRAST:  None  COMPLICATIONS: None immediate  PROCEDURE: Informed written consent was obtained from the patient after a discussion of the risks, benefits and alternatives to treatment. The patient was placed supine on the CT gantry and a pre procedural CT was performed re-demonstrating the known abscess/fluid collection within the caudal aspect of the iliopsoas and iliacus musculature with dominant cranial component measuring approximately 5.1 x 6.0 cm (image 22, series 2) and dominant caudal component measuring approximately 5.9 x 3.9 cm (image 36, series 2). The procedure was planned. A timeout was performed prior to the initiation of the procedure.  The skin overlying the anterior aspect of the left lower abdomen was prepped and draped in the usual sterile fashion.  Attention was initially paid towards the dominant caudal component located primarily within in the caudal aspect of the iliopsoas musculature.  The overlying soft tissues were anesthetized with 1% lidocaine with epinephrine. Appropriate trajectory was planned with the use of a 22 gauge spinal needle. An 18 gauge trocar needle was advanced into the abscess/fluid collection and a short Amplatz super stiff wire was coiled within the collection. Appropriate positioning was confirmed with a limited CT scan. The tract was serially dilated allowing placement of a 10 [place] all-purpose drainage catheter. Appropriate positioning was confirmed with a limited postprocedural CT scan.  A total of approximately 110 cc of purulent fluid was aspirated. The tube was connected to a JP bulb and sutured in place.  Following the placement of the initial percutaneous drainage catheter, CT imaging was performed demonstrating grossly unchanged size and appearance of the separate dominant component located within the iliacus musculature and as such the decision was made to place an additional percutaneous drainage catheter.  The overlying soft tissues were anesthetized with 1% lidocaine with epinephrine. The overlying soft tissues were anesthetized with 1% lidocaine with epinephrine. Appropriate trajectory was planned with the use of a 22 gauge spinal needle. An 18 gauge trocar needle was advanced into the abscess/fluid collection and a short Amplatz super stiff wire was coiled within the collection. Appropriate positioning was confirmed with a limited CT scan. The tract was serially dilated allowing placement of a 10 [place] all-purpose drainage catheter. Appropriate positioning was confirmed with a limited postprocedural CT scan.  A total of approximately 40 cc of purulent material was aspirated from the separate fluid collection. The tube was connected to a JP bulb and sutured in place.  Dressings were placed. The patient tolerated the procedure well without immediate postprocedural complication.  IMPRESSION: 1. Successful CT guided placement of a 10 [place] all purpose drain  catheter into the caudal aspect of the left iliopsoas musculature with aspiration of approximately 110 mL of purulent fluid. Samples were sent to the laboratory as requested by the ordering clinical team. 2. Successful CT-guided placement of a 10 [place] all-purpose drainage catheter into the dominant component within the left iliacus musculature with aspiration of approximately 40 cc of purulent material. Samples were sent to the laboratory as requested by the ordering clinical team.   Electronically Signed   By: Simonne Come M.D.   On: 05/16/2015 16:42   Ct Image Guided Drainage By Percutaneous Catheter  05/16/2015  INDICATION: History of back pain for 1 month with preceding lumbar spine MRI and contrast-enhanced abdominal CT demonstrating a large left sided psoas and iliacus abscess. Please perform CT-guided percutaneous drainage catheter placement.  EXAM: CT IMAGE GUIDED DRAINAGE BY PERCUTANEOUS CATHETER x2  COMPARISON:  Lumbar spine MRI - 05/15/2015; CT abdomen pelvis - 05/15/2015  MEDICATIONS: The patient is currently admitted to the hospital and receiving intravenous antibiotics. The antibiotics were administered within an appropriate time frame prior to the initiation of the procedure.  ANESTHESIA/SEDATION: Fentanyl 200 mcg IV; Versed 3 mg IV  Total Moderate Sedation time  30 minutes  CONTRAST:  None  COMPLICATIONS: None immediate  PROCEDURE: Informed written consent was obtained from the patient after a discussion of the risks, benefits and alternatives to treatment. The patient was placed supine on the CT gantry and a pre procedural CT was performed re-demonstrating the known abscess/fluid collection within the caudal aspect of the iliopsoas and iliacus musculature with dominant cranial component measuring approximately 5.1 x 6.0 cm (image 22, series 2) and dominant caudal component measuring approximately 5.9 x 3.9 cm (image 36, series 2). The procedure was planned. A timeout was performed prior to the  initiation of the procedure.  The skin overlying the anterior aspect of the left lower abdomen was prepped and draped in the usual sterile fashion.  Attention was initially paid towards the dominant caudal component located primarily within in the caudal aspect of the iliopsoas musculature. The overlying soft tissues were anesthetized with 1% lidocaine with epinephrine. Appropriate trajectory was planned with the use of a 22 gauge spinal needle. An 18 gauge trocar needle was advanced into the abscess/fluid collection and a short Amplatz super stiff wire was coiled within the collection. Appropriate positioning was confirmed with a limited CT scan. The tract was serially dilated allowing placement of a 10 [place] all-purpose drainage catheter. Appropriate positioning was confirmed with a limited postprocedural CT scan.  A total of approximately 110 cc of purulent fluid was aspirated. The tube was connected to a JP bulb and sutured in place.  Following the placement of the initial percutaneous drainage catheter, CT imaging was performed demonstrating grossly unchanged size and appearance of the separate dominant component located within the iliacus musculature and as such the decision was made to place an additional percutaneous drainage catheter.  The overlying soft tissues were anesthetized with 1% lidocaine with epinephrine. The overlying soft tissues were anesthetized with 1% lidocaine with epinephrine. Appropriate trajectory was planned with the use of a 22 gauge spinal needle. An 18 gauge trocar needle was advanced into the abscess/fluid collection and a short Amplatz super stiff wire was coiled within the collection. Appropriate positioning was confirmed with a limited CT scan. The tract was serially dilated allowing placement of a 10 [place] all-purpose drainage catheter. Appropriate positioning was confirmed with a limited postprocedural CT scan.  A total of approximately 40 cc of purulent material was  aspirated from the separate fluid collection. The tube was connected to a JP bulb and sutured in place.  Dressings were placed. The patient tolerated the procedure well without immediate postprocedural complication.  IMPRESSION: 1. Successful CT guided placement of a 10 [place] all purpose drain catheter into the caudal aspect of the left iliopsoas musculature with aspiration of approximately 110 mL of purulent fluid. Samples were sent to the laboratory as requested by the ordering clinical team. 2. Successful CT-guided placement of a 10 French all-purpose drainage catheter into the dominant component within the left iliacus musculature  with aspiration of approximately 40 cc of purulent material. Samples were sent to the laboratory as requested by the ordering clinical team.   Electronically Signed   By: Simonne Come M.D.   On: 05/16/2015 16:42    Scheduled Meds: . Chlorhexidine Gluconate Cloth  6 each Topical Q0600  . mupirocin ointment  1 application Nasal BID  . pneumococcal 23 valent vaccine  0.5 mL Intramuscular Tomorrow-1000  . vancomycin  1,000 mg Intravenous Q8H   Continuous Infusions: . sodium chloride 100 mL/hr at 05/16/15 1828   Antibiotics Given (last 72 hours)    Date/Time Action Medication Dose Rate   05/16/15 0527 Given   piperacillin-tazobactam (ZOSYN) IVPB 3.375 g 3.375 g 12.5 mL/hr   05/16/15 0949 Given   vancomycin (VANCOCIN) IVPB 1000 mg/200 mL premix 1,000 mg 200 mL/hr   05/16/15 1538 Given  [in IR]   piperacillin-tazobactam (ZOSYN) IVPB 3.375 g 3.375 g 12.5 mL/hr   05/16/15 1828 Given   vancomycin (VANCOCIN) IVPB 1000 mg/200 mL premix 1,000 mg 200 mL/hr   05/17/15 0248 Given   vancomycin (VANCOCIN) IVPB 1000 mg/200 mL premix 1,000 mg 200 mL/hr      Active Problems:   Iliopsoas abscess   Leukocytosis   Hyponatremia    Time spent: 25 min    Novaleigh Kohlman  [facility] Pager [phone]. If 7PM-7AM, please contact night-coverage at [email or web address], password  [facility] 05/17/2015, 7:52 AM  LOS: 2 days

## 2015-05-17 NOTE — Consult Note (Signed)
[facility]     Reason for Consult: Staph aurues bacteremia with Psoas abscess    Referring Physician: Dr. Benjamine Mola  Active Problems:   Iliopsoas abscess   Leukocytosis   Hyponatremia   . Chlorhexidine Gluconate Cloth  6 each Topical Q0600  . mupirocin ointment  1 application Nasal BID  . naproxen  500 mg Oral BID WC  . vancomycin  1,000 mg Intravenous Q8H    Recommendations: Vancomycin pending cultures Will need picc when blood cultures show clearance 48 hours TTE Repeat blood cultures  Assessment: He has Staph aureus bacteremia from Psoas abscess.  He also has had multiple skin abscesses in the past.  He though denies IVDU.     Antibiotics: vancomycin  HPI: [name] is a 39 y.o. male with multiple skin abscesses in the past who came to ED with worsening back pain and CT with iliopsoas abscess.  He now has had it drained by IR with drains in place and blood culture, 1/2 positive for Staph aureus.  Feels better after drainage.  Denies IVDU.  No diabetes.     Review of Systems: A comprehensive review of systems was negative.  Past Medical History  Diagnosis Date  . H/O back injury   . MRSA (methicillin resistant Staphylococcus aureus)     History  Substance Use Topics  . Smoking status: Heavy Tobacco Smoker -- 1.00 packs/day    Types: Cigarettes  . Smokeless tobacco: Never Used  . Alcohol Use: No    Family History  Problem Relation Age of Onset  . Cancer Father    Allergies  Allergen Reactions  . Vicodin [Hydrocodone-Acetaminophen] Hives and Itching  . Adhesive [Tape]     OBJECTIVE: Blood pressure 130/74, pulse 95, temperature 99.5 F (37.5 C), temperature source Oral, resp. rate 17, height 6' (1.829 m), weight 200 lb (90.719 kg), SpO2 94 %. General: awake, alert, nad Skin: tattoos, no rashes Lungs: CTA B Cor: RRR Abdomen: soft, nt, nd, 2 drains with serosanguionous drainage Ext: no edema  Microbiology: Recent  Results (from the past 240 hour(s))  Culture, blood (routine x 2)     Status: None (Preliminary result)   Collection Time: 05/15/15  4:00 PM  Result Value Ref Range Status   Specimen Description BLOOD LEFT FOREARM DRAWN BY RN  Final   Special Requests   Final    BOTTLES DRAWN AEROBIC AND ANAEROBIC AEB=6CC ANA=4CC   Culture NO GROWTH < 12 HOURS  Final   Report Status PENDING  Incomplete  Urine culture     Status: None (Preliminary result)   Collection Time: 05/15/15  4:13 PM  Result Value Ref Range Status   Specimen Description URINE, CLEAN CATCH  Final   Special Requests NONE  Final   Culture   Final    CULTURE REINCUBATED FOR BETTER GROWTH Performed at [facility]    Report Status PENDING  Incomplete  Culture, blood (routine x 2)     Status: None (Preliminary result)   Collection Time: 05/15/15  4:25 PM  Result Value Ref Range Status   Specimen Description BLOOD RIGHT WRIST  Final   Special Requests BOTTLES DRAWN AEROBIC AND ANAEROBIC 8CC EACH  Final   Culture  Setup Time   Final    GRAM POSITIVE COCCI IN CLUSTERS IN BOTH AEROBIC AND ANAEROBIC BOTTLES CRITICAL RESULT CALLED TO, READ BACK BY AND VERIFIED WITH: HUDSON K. AT [facility] AT 1043A ON [id number] BY THOMPSON S.  Culture   Final    STAPHYLOCOCCUS AUREUS Performed at [facility]    Report Status PENDING  Incomplete  MRSA PCR Screening     Status: Abnormal   Collection Time: 05/16/15  9:08 AM  Result Value Ref Range Status   MRSA by PCR POSITIVE (A) NEGATIVE Final    Comment: RESULT CALLED TO, READ BACK BY AND VERIFIED WITH: C. HUDSON RN 10:50 05/16/15 ([email or web address])        The GeneXpert MRSA Assay (FDA approved for NASAL specimens only), is one component of a comprehensive MRSA colonization surveillance program. It is not intended to diagnose MRSA infection nor to guide or monitor treatment for MRSA infections.     Staci Righter, MD [facility] [email or web address] [phone] pager  [phone] cell 05/17/2015, 11:53 AM

## 2015-05-18 DIAGNOSIS — K6812 Psoas muscle abscess: Secondary | ICD-10-CM | POA: Insufficient documentation

## 2015-05-18 LAB — CBC
HCT: 29.7 % — ABNORMAL LOW (ref 39.0–52.0)
Hemoglobin: 9.6 g/dL — ABNORMAL LOW (ref 13.0–17.0)
MCH: 26.7 pg (ref 26.0–34.0)
MCHC: 32.3 g/dL (ref 30.0–36.0)
MCV: 82.5 fL (ref 78.0–100.0)
Platelets: 514 10*3/uL — ABNORMAL HIGH (ref 150–400)
RBC: 3.6 MIL/uL — ABNORMAL LOW (ref 4.22–5.81)
RDW: 14.5 % (ref 11.5–15.5)
WBC: 13 10*3/uL — ABNORMAL HIGH (ref 4.0–10.5)

## 2015-05-18 LAB — CULTURE, BLOOD (ROUTINE X 2)

## 2015-05-18 LAB — BASIC METABOLIC PANEL
Anion gap: 6 (ref 5–15)
BUN: 15 mg/dL (ref 6–20)
CO2: 25 mmol/L (ref 22–32)
CREATININE: 0.65 mg/dL (ref 0.61–1.24)
Calcium: 7.9 mg/dL — ABNORMAL LOW (ref 8.9–10.3)
Chloride: 106 mmol/L (ref 101–111)
GFR calc Af Amer: >60 mL/min (ref >60)
Glucose, Bld: 161 mg/dL — ABNORMAL HIGH (ref 65–99)
POTASSIUM: 3.9 mmol/L (ref 3.5–5.1)
Sodium: 137 mmol/L (ref 135–145)

## 2015-05-18 MED ORDER — HYDROMORPHONE HCL 1 MG/ML IJ SOLN
2.0000 mg | Freq: Once | INTRAMUSCULAR | Status: AC
  Administered 2015-05-18: 2 mg via INTRAVENOUS
  Filled 2015-05-18: qty 2

## 2015-05-18 MED ORDER — TRAMADOL HCL 50 MG PO TABS
100.0000 mg | ORAL_TABLET | Freq: Four times a day (QID) | ORAL | Status: DC
  Administered 2015-05-18 – 2015-05-23 (×21): 100 mg via ORAL
  Filled 2015-05-18 (×21): qty 2

## 2015-05-18 NOTE — Progress Notes (Signed)
ANTIBIOTIC CONSULT NOTE   Pharmacy Consult for Vancomycin Indication:  Intra abdominal infection, abscess  Allergies  Allergen Reactions  . Vicodin [Hydrocodone-Acetaminophen] Hives and Itching  . Adhesive [Tape]     Patient Measurements: Height: 6' (182.9 cm) Weight: 200 lb (90.719 kg) IBW/kg (Calculated) : 77.6 Adjusted Body Weight:   Vital Signs: Temp: 98.3 F (36.8 C) (06/26 0624) Temp Source: Oral (06/26 0624) BP: 128/78 mmHg (06/26 0624) Pulse Rate: 88 (06/26 0624) Intake/Output from previous day: 06/25 0701 - 06/26 0700 In: 840 [P.O.:840] Out: 860 [Urine:800; Drains:60] Intake/Output from this shift: Total I/O In: 240 [P.O.:240] Out: -   Labs:  Recent Labs  05/15/15 1625 05/17/15 0520 05/18/15 0356  WBC 26.3* 17.6* 13.0*  HGB 11.8* 9.3* 9.6*  PLT 508* 446* 514*  CREATININE 0.71 0.61 0.65   Estimated Creatinine Clearance: 140.1 mL/min (by C-G formula based on Cr of 0.65). No results for input(s): VANCOTROUGH, VANCOPEAK, VANCORANDOM, GENTTROUGH, GENTPEAK, GENTRANDOM, TOBRATROUGH, TOBRAPEAK, TOBRARND, AMIKACINPEAK, AMIKACINTROU, AMIKACIN in the last 72 hours.   Microbiology: Recent Results (from the past 720 hour(s))  Culture, blood (routine x 2)     Status: None (Preliminary result)   Collection Time: 05/15/15  4:00 PM  Result Value Ref Range Status   Specimen Description BLOOD LEFT FOREARM DRAWN BY RN  Final   Special Requests   Final    BOTTLES DRAWN AEROBIC AND ANAEROBIC AEB=6CC ANA=4CC   Culture NO GROWTH 3 DAYS  Final   Report Status PENDING  Incomplete  Urine culture     Status: None (Preliminary result)   Collection Time: 05/15/15  4:13 PM  Result Value Ref Range Status   Specimen Description URINE, CLEAN CATCH  Final   Special Requests NONE  Final   Culture   Final    >=100,000 COLONIES/mL STAPHYLOCOCCUS AUREUS Performed at [facility]    Report Status PENDING  Incomplete  Culture, blood (routine x 2)     Status: None   Collection Time: 05/15/15  4:25 PM  Result Value Ref Range Status   Specimen Description BLOOD RIGHT WRIST  Final   Special Requests BOTTLES DRAWN AEROBIC AND ANAEROBIC 8CC EACH  Final   Culture  Setup Time   Final    GRAM POSITIVE COCCI IN CLUSTERS IN BOTH AEROBIC AND ANAEROBIC BOTTLES CRITICAL RESULT CALLED TO, READ BACK BY AND VERIFIED WITH: HUDSON K. AT [facility] AT 1043A ON [id number] BY THOMPSON S.    Culture   Final    METHICILLIN RESISTANT STAPHYLOCOCCUS AUREUS Performed at [facility]    Report Status 05/18/2015 FINAL  Final   Organism ID, Bacteria METHICILLIN RESISTANT STAPHYLOCOCCUS AUREUS  Final      Susceptibility   Methicillin resistant staphylococcus aureus - MIC*    CIPROFLOXACIN >=8 RESISTANT Resistant     ERYTHROMYCIN >=8 RESISTANT Resistant     GENTAMICIN <=0.5 SENSITIVE Sensitive     OXACILLIN >=4 RESISTANT Resistant     TETRACYCLINE <=1 SENSITIVE Sensitive     VANCOMYCIN 1 SENSITIVE Sensitive     TRIMETH/SULFA <=10 SENSITIVE Sensitive     CLINDAMYCIN <=0.25 SENSITIVE Sensitive     RIFAMPIN <=0.5 SENSITIVE Sensitive     Inducible Clindamycin NEGATIVE Sensitive     * METHICILLIN RESISTANT STAPHYLOCOCCUS AUREUS  MRSA PCR Screening     Status: Abnormal   Collection Time: 05/16/15  9:08 AM  Result Value Ref Range Status   MRSA by PCR POSITIVE (A) NEGATIVE Final    Comment: RESULT CALLED TO, READ BACK  BY AND VERIFIED WITH: C. HUDSON RN 10:50 05/16/15 ([email or web address])        The GeneXpert MRSA Assay (FDA approved for NASAL specimens only), is one component of a comprehensive MRSA colonization surveillance program. It is not intended to diagnose MRSA infection nor to guide or monitor treatment for MRSA infections.   Culture, routine-abscess     Status: None (Preliminary result)   Collection Time: 05/16/15  3:47 PM  Result Value Ref Range Status   Specimen Description ABSCESS  Final   Special Requests   Final    CRANIAL ASPECT OF THE LEFT ILIOPSOAS ABSCESS SYRINGE 1    Gram Stain   Final    ABUNDANT WBC PRESENT,BOTH PMN AND MONONUCLEAR NO SQUAMOUS EPITHELIAL CELLS SEEN MODERATE GRAM POSITIVE COCCI IN PAIRS IN CLUSTERS Performed at [facility]    Culture   Final    ABUNDANT STAPHYLOCOCCUS AUREUS Note: RIFAMPIN AND GENTAMICIN SHOULD NOT BE USED AS SINGLE DRUGS FOR TREATMENT OF STAPH INFECTIONS. Performed at [facility]    Report Status PENDING  Incomplete    Medical History: Past Medical History  Diagnosis Date  . H/O back injury   . MRSA (methicillin resistant Staphylococcus aureus)                 Assessment: Left iliopsoas abscess with culture growing MRSA  Goal of Therapy:  Vancomycin trough 15-20   Plan:  Vancomycin 1 GM IV every 8 hours Vancomycin trough in am  Thanks for allowing pharmacy to be a part of this patient's care.  Talbert Cage, PharmD Clinical Pharmacist, [phone] 05/18/2015,2:08 PM

## 2015-05-18 NOTE — Progress Notes (Addendum)
PROGRESS NOTE  [name] VPC:[id number] DOB: 01/31/1976 DOA: 05/15/2015 PCP: Pcp Not In System  Assessment/Plan: Left iliopsoas abscesses.  -IR for drain -culture  -denies risk factors for abscess:  denies any IV drug abuse.  -HIV non-reactive -final blood cultures pending  Gram + cocci bacteremia -IV vanc -consult ID  Elevated alkaline phosphatase, AST and LFT. This may be reflection of infection, versus an inflammatory process. Monitor closely.  Acute pain -PRN dilaudid/oxy  Hyponatremia -IVF  Leukocytosis -improving -IVF and trend  Nicotine abuse -told not to smoke in room -did not want patch  Code Status: full Family Communication: patient gave permission for me to talk to his family Disposition Plan:    Consultants:  IR  Procedures: CT IMAGE GUIDED DRAINAGE BY PERCUTANEOUS CATHETER [    HPI/Subjective: No overnight issues   Objective: Filed Vitals:   05/18/15 0624  BP: 128/78  Pulse: 88  Temp: 98.3 F (36.8 C)  Resp: 16    Intake/Output Summary (Last 24 hours) at 05/18/15 0943 Last data filed at 05/18/15 0900  Gross per 24 hour  Intake    720 ml  Output    860 ml  Net   -140 ml   Filed Weights   05/15/15 1340  Weight: 90.719 kg (200 lb)    Exam:   General:  A+Ox3, NAD   Data Reviewed: Basic Metabolic Panel:  Recent Labs Lab 05/15/15 1625 05/17/15 0520 05/18/15 0356  NA 129* 132* 137  K 4.5 4.0 3.9  CL 92* 98* 106  CO2 27 27 25   GLUCOSE 113* 107* 161*  BUN 15 8 15   CREATININE 0.71 0.61 0.65  CALCIUM 8.5* 7.7* 7.9*   Liver Function Tests:  Recent Labs Lab 05/15/15 1625 05/17/15 0520  AST 69* 60*  ALT 174* 120*  ALKPHOS 381* 341*  BILITOT 1.2 0.9  PROT 7.6 6.2*  ALBUMIN 2.3* 1.7*    Recent Labs Lab 05/15/15 1625  LIPASE 18*   No results for input(s): AMMONIA in the last 168 hours. CBC:  Recent Labs Lab 05/15/15 1625 05/17/15 0520 05/18/15 0356  WBC 26.3* 17.6* 13.0*  NEUTROABS 22.7*  --    --   HGB 11.8* 9.3* 9.6*  HCT 35.1* 27.9* 29.7*  MCV 82.6 81.3 82.5  PLT 508* 446* 514*   Cardiac Enzymes: No results for input(s): CKTOTAL, CKMB, CKMBINDEX, TROPONINI in the last 168 hours. BNP (last 3 results) No results for input(s): BNP in the last 8760 hours.  ProBNP (last 3 results) No results for input(s): PROBNP in the last 8760 hours.  CBG: No results for input(s): GLUCAP in the last 168 hours.  Recent Results (from the past 240 hour(s))  Culture, blood (routine x 2)     Status: None (Preliminary result)   Collection Time: 05/15/15  4:00 PM  Result Value Ref Range Status   Specimen Description BLOOD LEFT FOREARM DRAWN BY RN  Final   Special Requests   Final    BOTTLES DRAWN AEROBIC AND ANAEROBIC AEB=6CC ANA=4CC   Culture NO GROWTH 3 DAYS  Final   Report Status PENDING  Incomplete  Urine culture     Status: None (Preliminary result)   Collection Time: 05/15/15  4:13 PM  Result Value Ref Range Status   Specimen Description URINE, CLEAN CATCH  Final   Special Requests NONE  Final   Culture   Final    CULTURE REINCUBATED FOR BETTER GROWTH Performed at [facility]    Report Status PENDING  Incomplete  Culture, blood (routine x 2)     Status: None (Preliminary result)   Collection Time: 05/15/15  4:25 PM  Result Value Ref Range Status   Specimen Description BLOOD RIGHT WRIST  Final   Special Requests BOTTLES DRAWN AEROBIC AND ANAEROBIC 8CC EACH  Final   Culture  Setup Time   Final    GRAM POSITIVE COCCI IN CLUSTERS IN BOTH AEROBIC AND ANAEROBIC BOTTLES CRITICAL RESULT CALLED TO, READ BACK BY AND VERIFIED WITH: HUDSON K. AT [facility] AT 1043A ON [id number] BY THOMPSON S.    Culture   Final    STAPHYLOCOCCUS AUREUS Performed at [facility]    Report Status PENDING  Incomplete  MRSA PCR Screening     Status: Abnormal   Collection Time: 05/16/15  9:08 AM  Result Value Ref Range Status   MRSA by PCR POSITIVE (A) NEGATIVE Final    Comment: RESULT CALLED TO,  READ BACK BY AND VERIFIED WITH: C. HUDSON RN 10:50 05/16/15 ([email or web address])        The GeneXpert MRSA Assay (FDA approved for NASAL specimens only), is one component of a comprehensive MRSA colonization surveillance program. It is not intended to diagnose MRSA infection nor to guide or monitor treatment for MRSA infections.   Culture, routine-abscess     Status: None (Preliminary result)   Collection Time: 05/16/15  3:47 PM  Result Value Ref Range Status   Specimen Description ABSCESS  Final   Special Requests   Final    CRANIAL ASPECT OF THE LEFT ILIOPSOAS ABSCESS SYRINGE 1   Gram Stain   Final    ABUNDANT WBC PRESENT,BOTH PMN AND MONONUCLEAR NO SQUAMOUS EPITHELIAL CELLS SEEN MODERATE GRAM POSITIVE COCCI IN PAIRS IN CLUSTERS Performed at [facility]    Culture   Final    ABUNDANT STAPHYLOCOCCUS AUREUS Note: RIFAMPIN AND GENTAMICIN SHOULD NOT BE USED AS SINGLE DRUGS FOR TREATMENT OF STAPH INFECTIONS. Performed at [facility]    Report Status PENDING  Incomplete     Studies: Ct Image Guided Drainage By Percutaneous Catheter  05/16/2015   INDICATION: History of back pain for 1 month with preceding lumbar spine MRI and contrast-enhanced abdominal CT demonstrating a large left sided psoas and iliacus abscess. Please perform CT-guided percutaneous drainage catheter placement.  EXAM: CT IMAGE GUIDED DRAINAGE BY PERCUTANEOUS CATHETER x2  COMPARISON:  Lumbar spine MRI - 05/15/2015; CT abdomen pelvis - 05/15/2015  MEDICATIONS: The patient is currently admitted to the hospital and receiving intravenous antibiotics. The antibiotics were administered within an appropriate time frame prior to the initiation of the procedure.  ANESTHESIA/SEDATION: Fentanyl 200 mcg IV; Versed 3 mg IV  Total Moderate Sedation time  30 minutes  CONTRAST:  None  COMPLICATIONS: None immediate  PROCEDURE: Informed written consent was obtained from the patient after a discussion of the risks, benefits and  alternatives to treatment. The patient was placed supine on the CT gantry and a pre procedural CT was performed re-demonstrating the known abscess/fluid collection within the caudal aspect of the iliopsoas and iliacus musculature with dominant cranial component measuring approximately 5.1 x 6.0 cm (image 22, series 2) and dominant caudal component measuring approximately 5.9 x 3.9 cm (image 36, series 2). The procedure was planned. A timeout was performed prior to the initiation of the procedure.  The skin overlying the anterior aspect of the left lower abdomen was prepped and draped in the usual sterile fashion.  Attention was initially paid towards the dominant caudal component  located primarily within in the caudal aspect of the iliopsoas musculature. The overlying soft tissues were anesthetized with 1% lidocaine with epinephrine. Appropriate trajectory was planned with the use of a 22 gauge spinal needle. An 18 gauge trocar needle was advanced into the abscess/fluid collection and a short Amplatz super stiff wire was coiled within the collection. Appropriate positioning was confirmed with a limited CT scan. The tract was serially dilated allowing placement of a 10 [place] all-purpose drainage catheter. Appropriate positioning was confirmed with a limited postprocedural CT scan.  A total of approximately 110 cc of purulent fluid was aspirated. The tube was connected to a JP bulb and sutured in place.  Following the placement of the initial percutaneous drainage catheter, CT imaging was performed demonstrating grossly unchanged size and appearance of the separate dominant component located within the iliacus musculature and as such the decision was made to place an additional percutaneous drainage catheter.  The overlying soft tissues were anesthetized with 1% lidocaine with epinephrine. The overlying soft tissues were anesthetized with 1% lidocaine with epinephrine. Appropriate trajectory was planned with the use  of a 22 gauge spinal needle. An 18 gauge trocar needle was advanced into the abscess/fluid collection and a short Amplatz super stiff wire was coiled within the collection. Appropriate positioning was confirmed with a limited CT scan. The tract was serially dilated allowing placement of a 10 [place] all-purpose drainage catheter. Appropriate positioning was confirmed with a limited postprocedural CT scan.  A total of approximately 40 cc of purulent material was aspirated from the separate fluid collection. The tube was connected to a JP bulb and sutured in place.  Dressings were placed. The patient tolerated the procedure well without immediate postprocedural complication.  IMPRESSION: 1. Successful CT guided placement of a 10 [place] all purpose drain catheter into the caudal aspect of the left iliopsoas musculature with aspiration of approximately 110 mL of purulent fluid. Samples were sent to the laboratory as requested by the ordering clinical team. 2. Successful CT-guided placement of a 10 [place] all-purpose drainage catheter into the dominant component within the left iliacus musculature with aspiration of approximately 40 cc of purulent material. Samples were sent to the laboratory as requested by the ordering clinical team.   Electronically Signed   By: Simonne Come M.D.   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 Please perform CT-guided percutaneous drainage catheter placement.  EXAM: CT IMAGE GUIDED DRAINAGE BY PERCUTANEOUS CATHETER x2  COMPARISON:  Lumbar spine MRI - 05/15/2015; CT abdomen pelvis - 05/15/2015  MEDICATIONS: The patient is currently admitted to the hospital and receiving intravenous antibiotics. The antibiotics were administered within an appropriate time frame prior to the  initiation of the procedure.  ANESTHESIA/SEDATION: Fentanyl 200 mcg IV; Versed 3 mg IV  Total Moderate Sedation time  30 minutes  CONTRAST:  None  COMPLICATIONS: None immediate  PROCEDURE: Informed written consent was obtained from the patient after a discussion of the risks, benefits and alternatives to treatment. The patient was placed supine on the CT gantry and a pre procedural CT was performed re-demonstrating the known abscess/fluid collection within the caudal aspect of the iliopsoas and iliacus musculature with dominant cranial component measuring approximately 5.1 x 6.0 cm (image 22, series 2) and dominant caudal component measuring approximately 5.9 x 3.9  cm (image 36, series 2). The procedure was planned. A timeout was performed prior to the initiation of the procedure.  The skin overlying the anterior aspect of the left lower abdomen was prepped and draped in the usual sterile fashion.  Attention was initially paid towards the dominant caudal component located primarily within in the caudal aspect of the iliopsoas musculature. The overlying soft tissues were anesthetized with 1% lidocaine with epinephrine. Appropriate trajectory was planned with the use of a 22 gauge spinal needle. An 18 gauge trocar needle was advanced into the abscess/fluid collection and a short Amplatz super stiff wire was coiled within the collection. Appropriate positioning was confirmed with a limited CT scan. The tract was serially dilated allowing placement of a 10 [place] all-purpose drainage catheter. Appropriate positioning was confirmed with a limited postprocedural CT scan.  A total of approximately 110 cc of purulent fluid was aspirated. The tube was connected to a JP bulb and sutured in place.  Following the placement of the initial percutaneous drainage catheter, CT imaging was performed demonstrating grossly unchanged size and appearance of the separate dominant component located within the iliacus musculature and as  such the decision was made to place an additional percutaneous drainage catheter.  The overlying soft tissues were anesthetized with 1% lidocaine with epinephrine. The overlying soft tissues were anesthetized with 1% lidocaine with epinephrine. Appropriate trajectory was planned with the use of a 22 gauge spinal needle. An 18 gauge trocar needle was advanced into the abscess/fluid collection and a short Amplatz super stiff wire was coiled within the collection. Appropriate positioning was confirmed with a limited CT scan. The tract was serially dilated allowing placement of a 10 [place] all-purpose drainage catheter. Appropriate positioning was confirmed with a limited postprocedural CT scan.  A total of approximately 40 cc of purulent material was aspirated from the separate fluid collection. The tube was connected to a JP bulb and sutured in place.  Dressings were placed. The patient tolerated the procedure well without immediate postprocedural complication.  IMPRESSION: 1. Successful CT guided placement of a 10 [place] all purpose drain catheter into the caudal aspect of the left iliopsoas musculature with aspiration of approximately 110 mL of purulent fluid. Samples were sent to the laboratory as requested by the ordering clinical team. 2. Successful CT-guided placement of a 10 French all-purpose drainage catheter into the dominant component within the left iliacus musculature with aspiration of approximately 40 cc of purulent material. Samples were sent to the laboratory as requested by the ordering clinical team.   Electronically Signed   By: Simonne Come M.D.   On: 05/16/2015 16:42    Scheduled Meds: . Chlorhexidine Gluconate Cloth  6 each Topical Q0600  . mupirocin ointment  1 application Nasal BID  . naproxen  500 mg Oral BID WC  . traMADol  100 mg Oral 4 times per day  . vancomycin  1,000 mg Intravenous Q8H   Continuous Infusions: . sodium chloride 100 mL/hr at 05/18/15 0122   Antibiotics Given  (last 72 hours)    Date/Time Action Medication Dose Rate   05/16/15 0527 Given   piperacillin-tazobactam (ZOSYN) IVPB 3.375 g 3.375 g 12.5 mL/hr   05/16/15 0949 Given   vancomycin (VANCOCIN) IVPB 1000 mg/200 mL premix 1,000 mg 200 mL/hr   05/16/15 1538 Given  [in IR]   piperacillin-tazobactam (ZOSYN) IVPB 3.375 g 3.375 g 12.5 mL/hr   05/16/15 1828 Given   vancomycin (VANCOCIN) IVPB 1000 mg/200 mL premix 1,000 mg 200 mL/hr  05/17/15 0248 Given   vancomycin (VANCOCIN) IVPB 1000 mg/200 mL premix 1,000 mg 200 mL/hr   05/17/15 1254 Given   vancomycin (VANCOCIN) IVPB 1000 mg/200 mL premix 1,000 mg 200 mL/hr   05/17/15 1921 Given   vancomycin (VANCOCIN) IVPB 1000 mg/200 mL premix 1,000 mg 200 mL/hr   05/18/15 0120 Given   vancomycin (VANCOCIN) IVPB 1000 mg/200 mL premix 1,000 mg 200 mL/hr      Active Problems:   Iliopsoas abscess   Leukocytosis   Hyponatremia    Time spent: 25 min    VANN, JESSICA  [facility] Pager [phone]. If 7PM-7AM, please contact night-coverage at [email or web address], password [facility] 05/18/2015, 9:43 AM  LOS: 3 days

## 2015-05-18 NOTE — Progress Notes (Signed)
Patient ID: [name], male   DOB: 02/11/1976, 39 y.o.   MRN: [id number]   LOS: 3 days   Subjective: Pain somewhat worse   Objective: Vital signs in last 24 hours: Temp:  [97.9 F (36.6 C)-98.3 F (36.8 C)] 98.3 F (36.8 C) (06/26 0624) Pulse Rate:  [88] 88 (06/26 0624) Resp:  [16-18] 16 (06/26 0624) BP: (125-128)/(78-79) 128/78 mmHg (06/26 0624) SpO2:  [98 %-100 %] 98 % (06/26 0624) Last BM Date: 05/16/15   JP#1: 74ml/24h JP#2: 74ml/24h   Laboratory  CBC  Recent Labs  05/17/15 0520 05/18/15 0356  WBC 17.6* 13.0*  HGB 9.3* 9.6*  HCT 27.9* 29.7*  PLT 446* 514*   BMET  Recent Labs  05/17/15 0520 05/18/15 0356  NA 132* 137  K 4.0 3.9  CL 98* 106  CO2 27 25  GLUCOSE 107* 161*  BUN 8 15  CREATININE 0.61 0.65  CALCIUM 7.7* 7.9*    Physical Exam General appearance: alert and no distress Resp: clear to auscultation bilaterally Cardio: regular rate and rhythm GI: Soft, +BS, mod LLQ TTP   Assessment/Plan: Iliopsoas abscess, Staph aureus -- Awaiting culture sensitivities, WBC improved, afebrile. Vanc D3. ID involved. CPM. Add scheduled tramadol for pain control.    Freeman Caldron, PA-C Pager: [phone] 05/18/2015

## 2015-05-18 NOTE — Progress Notes (Signed)
Referring Physician(s): TRH  Subjective:  Iliopsoas abscess LLQ drains x 2 placed 6/24 +staph aureus Better daily   Allergies: Vicodin and Adhesive  Medications: Prior to Admission medications   Medication Sig Start Date End Date Taking? Authorizing Provider  diphenhydramine-acetaminophen (TYLENOL PM) 25-500 MG TABS Take 1 tablet by mouth at bedtime as needed.   Yes Historical Provider, MD  cyclobenzaprine (FLEXERIL) 5 MG tablet Take 1 tablet (5 mg total) by mouth 3 (three) times daily as needed for muscle spasms. Patient not taking: Reported on 05/15/2015 05/04/15   Myra Rude, MD  meloxicam (MOBIC) 7.5 MG tablet Take 1 tablet (7.5 mg total) by mouth daily. Patient not taking: Reported on 05/15/2015 05/04/15   Myra Rude, MD  predniSONE (DELTASONE) 10 MG tablet Take 2 tablets (20 mg total) by mouth 2 (two) times daily. Patient not taking: Reported on 05/15/2015 05/05/15   Geoffery Lyons, MD     Vital Signs: BP 128/78 mmHg  Pulse 88  Temp(Src) 98.3 F (36.8 C) (Oral)  Resp 16  Ht 6' (1.829 m)  Wt 200 lb (90.719 kg)  BMI 27.12 kg/m2  SpO2 98%  Physical Exam  Abdominal:  Skin sites of drain clean and dry Output 20 and 40 cc yesterday Pus like material; blood tinged from more medial drain Wbc 13 (17.6) afeb  cx + staph  Nursing note and vitals reviewed.   Imaging: Dg Chest 2 View  05/15/2015   CLINICAL DATA:  Smoker. Difficulty eating with abdominal pain. Swelling in rash involving both feet. Back pain.  EXAM: CHEST  2 VIEW  COMPARISON:  None.  FINDINGS: Cardiomediastinal silhouette is within normal limits. The lungs are mildly hypoinflated without evidence of airspace consolidation, edema, pleural effusion, or pneumothorax. No acute osseous abnormality is identified.  IMPRESSION: No active cardiopulmonary disease.   Electronically Signed   By: Sebastian Ache   On: 05/15/2015 16:51   Mr Lumbar Spine Wo Contrast  05/15/2015   CLINICAL DATA:  39 year old male  with severe lumbar back pain involving the left lower extremity x1 month. Weakness.  EXAM: MRI LUMBAR SPINE WITHOUT CONTRAST  TECHNIQUE: Multiplanar, multisequence MR imaging of the lumbar spine was performed. No intravenous contrast was administered.  COMPARISON:  Lumbar spine radiographs 11/22/2012.  FINDINGS: Study is intermittently degraded by motion artifact despite repeated imaging attempts.  Normal lumbar segmentation demonstrated in 2014. Stable vertebral height and alignment.  There is disc desiccation at T11-T12 and disc bulge which results in borderline to mild spinal stenosis. No mass effect on the lower thoracic spinal cord at this level. No signal abnormality in the lower thoracic spinal cord or conus, the tip of the conus is at L1-L2. The T12-L1 through L3-L4 discs then are normal.  There is severe edema in the medial and posterior left psoas muscle beginning at the L2-L3 level but increasing along the caudal aspect of the muscle. There is also severe edema in the visible left iliacus muscle and in the left posterior paraspinal muscles (erector spine I) beginning at the L4-L5 level and continuing inferiorly. There is some evidence of left sacral ala marrow edema (series 5, image 15).  There is a superimposed posterior epidural mass extending from the mid L4 to the mid S1 level. This measures up to 10 mm AP thickness and contributes to severe spinal stenosis at the L4-L5 and L5-S1 levels due to mass effect on the posterior thecal sac (series 7, image 31). There is chronic disc degeneration at each  1 of these levels with disc bulging and central disc herniation.  Furthermore there is abnormal presacral STIR signal (series 5, image 19).  The right side paraspinal muscles and soft tissues appear normal.  IMPRESSION: 1. Marked abnormality in the left psoas, iliacus, and lower paraspinal muscles. The epicenter seems to be the left sacral ala which appears edematous. Leading consideration at this point is  septic left SI joint. In light of the patient's difficulty with MRI, CT Abdomen and Pelvis with oral and IV contrast may be most valuable at this point. 2. Widespread posterior epidural space-occupying mass might be infectious phlegmon. This contributes to severe spinal stenosis at L4-L5 and L5-S1 due to posterior thecal sac mass effect. There is underlying L4-L5 and L5-S1 disc degeneration. 3. No definite marrow edema or acute osseous abnormality in the lumbar spine. Study discussed by telephone with Dr. Vanetta Mulders on 05/15/2015 at 1730 hr.   Electronically Signed   By: Odessa Fleming M.D.   On: 05/15/2015 18:12   Ct Abdomen Pelvis W Contrast  05/15/2015   CLINICAL DATA:  Patient with rash and swelling to the bilateral feet. Lower abdominal pain for 1 month.  EXAM: CT ABDOMEN AND PELVIS WITH CONTRAST  TECHNIQUE: Multidetector CT imaging of the abdomen and pelvis was performed using the standard protocol following bolus administration of intravenous contrast.  CONTRAST:  OMNIPAQUE IOHEXOL 300 MG/ML SOLN, 63mL OMNIPAQUE IOHEXOL 300 MG/ML SOLN  COMPARISON:  MRI lumbar spine 05/15/2015  FINDINGS: Lower chest: Normal heart size. No consolidative or nodular pulmonary opacities. No pleural effusion.  Hepatobiliary: Liver is normal in size and contour without focal hepatic lesion identified. Gallbladder is decompressed. No intrahepatic or extrahepatic biliary ductal dilatation.  Pancreas: Unremarkable  Spleen: Multiple calcified granulomata.  Adrenals/Urinary Tract: Adrenal glands are normal. Kidneys enhance symmetrically with contrast. No hydronephrosis.  Stomach/Bowel: No abnormal bowel wall thickening or evidence for bowel obstruction. The appendix is normal. No free intraperitoneal air.  Vascular/Lymphatic: There is significant mass effect on the left internal and external iliac veins from adjacent loculated fluid collection. Normal caliber abdominal aorta.  Other: None  Musculoskeletal: There is expansion of  left psoas muscle from the approximate L2 vertebral level extending caudally were there is also expansion of the iliacus muscle. Near the junction there are multiple adjacent loculated appearing rim enhancing fluid collections with at least 1 of these collections appearing to contain small foci of gas. Collection within the left iliacus musculature measures 4.9 x 4.5 x 7.8 cm (image 67; series 2). Collections appear to extend into the left pelvic sidewall musculature. There appears to be some fat stranding about the proximal left lower extremity musculature. No definite osseous cortical destruction identified either involving the lower lumbar spine or pelvis.  IMPRESSION: Abnormal thickening and expansion of the left ileopsoas musculature which appears to contain multiple rim enhancing gas containing fluid collections most compatible with abscesses. It is not clear the exact origin of this apparent infectious process. No definite evidence for osseous erosion of the lumbar spine or pelvis on CT imaging.  The muscular expansion and abscesses exert significant mass effect on the left internal and external iliac veins.  Left hemi-pelvic and inguinal adenopathy likely reactive.   Electronically Signed   By: Annia Belt M.D.   On: 05/15/2015 19:29   Ct Image Guided Drainage By Percutaneous Catheter  05/16/2015   INDICATION: History of back pain for 1 month with preceding lumbar spine MRI and contrast-enhanced abdominal CT demonstrating a large left  sided psoas and iliacus abscess. Please perform CT-guided percutaneous drainage catheter placement.  EXAM: CT IMAGE GUIDED DRAINAGE BY PERCUTANEOUS CATHETER x2  COMPARISON:  Lumbar spine MRI - 05/15/2015; CT abdomen pelvis - 05/15/2015  MEDICATIONS: The patient is currently admitted to the hospital and receiving intravenous antibiotics. The antibiotics were administered within an appropriate time frame prior to the initiation of the procedure.  ANESTHESIA/SEDATION: Fentanyl  200 mcg IV; Versed 3 mg IV  Total Moderate Sedation time  30 minutes  CONTRAST:  None  COMPLICATIONS: None immediate  PROCEDURE: Informed written consent was obtained from the patient after a discussion of the risks, benefits and alternatives to treatment. The patient was placed supine on the CT gantry and a pre procedural CT was performed re-demonstrating the known abscess/fluid collection within the caudal aspect of the iliopsoas and iliacus musculature with dominant cranial component measuring approximately 5.1 x 6.0 cm (image 22, series 2) and dominant caudal component measuring approximately 5.9 x 3.9 cm (image 36, series 2). The procedure was planned. A timeout was performed prior to the initiation of the procedure.  The skin overlying the anterior aspect of the left lower abdomen was prepped and draped in the usual sterile fashion.  Attention was initially paid towards the dominant caudal component located primarily within in the caudal aspect of the iliopsoas musculature. The overlying soft tissues were anesthetized with 1% lidocaine with epinephrine. Appropriate trajectory was planned with the use of a 22 gauge spinal needle. An 18 gauge trocar needle was advanced into the abscess/fluid collection and a short Amplatz super stiff wire was coiled within the collection. Appropriate positioning was confirmed with a limited CT scan. The tract was serially dilated allowing placement of a 10 [place] all-purpose drainage catheter. Appropriate positioning was confirmed with a limited postprocedural CT scan.  A total of approximately 110 cc of purulent fluid was aspirated. The tube was connected to a JP bulb and sutured in place.  Following the placement of the initial percutaneous drainage catheter, CT imaging was performed demonstrating grossly unchanged size and appearance of the separate dominant component located within the iliacus musculature and as such the decision was made to place an additional percutaneous  drainage catheter.  The overlying soft tissues were anesthetized with 1% lidocaine with epinephrine. The overlying soft tissues were anesthetized with 1% lidocaine with epinephrine. Appropriate trajectory was planned with the use of a 22 gauge spinal needle. An 18 gauge trocar needle was advanced into the abscess/fluid collection and a short Amplatz super stiff wire was coiled within the collection. Appropriate positioning was confirmed with a limited CT scan. The tract was serially dilated allowing placement of a 10 [place] all-purpose drainage catheter. Appropriate positioning was confirmed with a limited postprocedural CT scan.  A total of approximately 40 cc of purulent material was aspirated from the separate fluid collection. The tube was connected to a JP bulb and sutured in place.  Dressings were placed. The patient tolerated the procedure well without immediate postprocedural complication.  IMPRESSION: 1. Successful CT guided placement of a 10 [place] all purpose drain catheter into the caudal aspect of the left iliopsoas musculature with aspiration of approximately 110 mL of purulent fluid. Samples were sent to the laboratory as requested by the ordering clinical team. 2. Successful CT-guided placement of a 10 [place] all-purpose drainage catheter into the dominant component within the left iliacus musculature with aspiration of approximately 40 cc of purulent material. Samples were sent to the laboratory as requested by the ordering clinical  team.   Electronically Signed   By: Simonne Come M.D.   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 Please perform CT-guided percutaneous drainage catheter placement.  EXAM: CT IMAGE GUIDED DRAINAGE BY PERCUTANEOUS CATHETER x2  COMPARISON:  Lumbar spine MRI  - 05/15/2015; CT abdomen pelvis - 05/15/2015  MEDICATIONS: The patient is currently admitted to the hospital and receiving intravenous antibiotics. The antibiotics were administered within an appropriate time frame prior to the initiation of the procedure.  ANESTHESIA/SEDATION: Fentanyl 200 mcg IV; Versed 3 mg IV  Total Moderate Sedation time  30 minutes  CONTRAST:  None  COMPLICATIONS: None immediate  PROCEDURE: Informed written consent was obtained from the patient after a discussion of the risks, benefits and alternatives to treatment. The patient was placed supine on the CT gantry and a pre procedural CT was performed re-demonstrating the known abscess/fluid collection within the caudal aspect of the iliopsoas and iliacus musculature with dominant cranial component measuring approximately 5.1 x 6.0 cm (image 22, series 2) and dominant caudal component measuring approximately 5.9 x 3.9 cm (image 36, series 2). The procedure was planned. A timeout was performed prior to the initiation of the procedure.  The skin overlying the anterior aspect of the left lower abdomen was prepped and draped in the usual sterile fashion.  Attention was initially paid towards the dominant caudal component located primarily within in the caudal aspect of the iliopsoas musculature. The overlying soft tissues were anesthetized with 1% lidocaine with epinephrine. Appropriate trajectory was planned with the use of a 22 gauge spinal needle. An 18 gauge trocar needle was advanced into the abscess/fluid collection and a short Amplatz super stiff wire was coiled within the collection. Appropriate positioning was confirmed with a limited CT scan. The tract was serially dilated allowing placement of a 10 [place] all-purpose drainage catheter. Appropriate positioning was confirmed with a limited postprocedural CT scan.  A total of approximately 110 cc of purulent fluid was aspirated. The tube was connected to a JP bulb and sutured in place.   Following the placement of the initial percutaneous drainage catheter, CT imaging was performed demonstrating grossly unchanged size and appearance of the separate dominant component located within the iliacus musculature and as such the decision was made to place an additional percutaneous drainage catheter.  The overlying soft tissues were anesthetized with 1% lidocaine with epinephrine. The overlying soft tissues were anesthetized with 1% lidocaine with epinephrine. Appropriate trajectory was planned with the use of a 22 gauge spinal needle. An 18 gauge trocar needle was advanced into the abscess/fluid collection and a short Amplatz super stiff wire was coiled within the collection. Appropriate positioning was confirmed with a limited CT scan. The tract was serially dilated allowing placement of a 10 [place] all-purpose drainage catheter. Appropriate positioning was confirmed with a limited postprocedural CT scan.  A total of approximately 40 cc of purulent material was aspirated from the separate fluid collection. The tube was connected to a JP bulb and sutured in place.  Dressings were placed. The patient tolerated the procedure well without immediate postprocedural complication.  IMPRESSION: 1. Successful CT guided placement of a 10 [place] all purpose drain catheter into the caudal aspect of the left iliopsoas musculature with aspiration of approximately 110 mL of purulent fluid. Samples were sent  to the laboratory as requested by the ordering clinical team. 2. Successful CT-guided placement of a 10 French all-purpose drainage catheter into the dominant component within the left iliacus musculature with aspiration of approximately 40 cc of purulent material. Samples were sent to the laboratory as requested by the ordering clinical team.   Electronically Signed   By: Simonne Come M.D.   On: 05/16/2015 16:42    Labs:  CBC:  Recent Labs  05/15/15 1625 05/17/15 0520 05/18/15 0356  WBC 26.3* 17.6* 13.0*    HGB 11.8* 9.3* 9.6*  HCT 35.1* 27.9* 29.7*  PLT 508* 446* 514*    COAGS:  Recent Labs  05/15/15 1625  INR 1.33    BMP:  Recent Labs  05/15/15 1625 05/17/15 0520 05/18/15 0356  NA 129* 132* 137  K 4.5 4.0 3.9  CL 92* 98* 106  CO2 27 27 25   GLUCOSE 113* 107* 161*  BUN 15 8 15   CALCIUM 8.5* 7.7* 7.9*  CREATININE 0.71 0.61 0.65  GFRNONAA >60 >60 >60  GFRAA >60 >60 >60    LIVER FUNCTION TESTS:  Recent Labs  05/15/15 1625 05/17/15 0520  BILITOT 1.2 0.9  AST 69* 60*  ALT 174* 120*  ALKPHOS 381* 341*  PROT 7.6 6.2*  ALBUMIN 2.3* 1.7*    Assessment and Plan:  Iliopsoas abscess Draining well Will follow  plan per [facility] and [facility] Will need re CT before pull Can be scheduled with IR drain clinic  Signed: Eilleen Davoli A 05/18/2015, 9:12 AM   I spent a total of 15 Minutes in face to face in clinical consultation/evaluation, greater than 50% of which was counseling/coordinating care for abscess drains

## 2015-05-19 LAB — CULTURE, ROUTINE-ABSCESS

## 2015-05-19 LAB — URINE CULTURE: Culture: >=100000

## 2015-05-19 LAB — VANCOMYCIN, TROUGH: Vancomycin Tr: 12 ug/mL (ref 10.0–20.0)

## 2015-05-19 MED ORDER — VANCOMYCIN HCL 10 G IV SOLR
1250.0000 mg | Freq: Three times a day (TID) | INTRAVENOUS | Status: DC
  Administered 2015-05-19 – 2015-05-22 (×9): 1250 mg via INTRAVENOUS
  Filled 2015-05-19 (×10): qty 1250

## 2015-05-19 MED ORDER — HYDROMORPHONE HCL 1 MG/ML IJ SOLN
1.0000 mg | Freq: Once | INTRAMUSCULAR | Status: AC | PRN
  Administered 2015-05-19: 1 mg via INTRAVENOUS

## 2015-05-19 NOTE — Progress Notes (Signed)
ANTIBIOTIC CONSULT NOTE   Pharmacy Consult for Vancomycin Indication:  Intra abdominal infection, abscess  Allergies  Allergen Reactions  . Vicodin [Hydrocodone-Acetaminophen] Hives and Itching  . Adhesive [Tape]     Patient Measurements: Height: 6' (182.9 cm) Weight: 200 lb (90.719 kg) IBW/kg (Calculated) : 77.6 Adjusted Body Weight:   Vital Signs: Temp: 98.6 F (37 C) (06/27 0422) Temp Source: Oral (06/27 0422) BP: 142/80 mmHg (06/27 0422) Pulse Rate: 91 (06/27 0422) Intake/Output from previous day: 06/26 0701 - 06/27 0700 In: 1140 [P.O.:720; IV Piggyback:400] Out: 950 [Urine:900; Drains:50] Intake/Output from this shift:    Labs:  Recent Labs  05/17/15 0520 05/18/15 0356  WBC 17.6* 13.0*  HGB 9.3* 9.6*  PLT 446* 514*  CREATININE 0.61 0.65   Estimated Creatinine Clearance: 140.1 mL/min (by C-G formula based on Cr of 0.65).  Recent Labs  05/19/15 0916  VANCOTROUGH 12    Assessment: 38 YOM on vancomycin D#5 for Left iliopsoas abscesses and MRSA bacteremia, ID following, denies IVDU, HIV neg, 2D echo neg for vege, needs PICC line once bld cxs are cleared. WBC dec to 13  Afeb. Vancomycin trough = 12 slightly subtherapeutic. Renal function is good with est. Crcl > 100 ml/min.   Vancomycin 6/23> Zosyn 6/23 > 6/24 Percutaneous drain placed  6/23 BC x 2 --> MRSA MRSA PCR positive 6/23  Urine > MRSA 6/24  Abscess>> MRSA 6/26 Blood x 2    Goal of Therapy:  Vancomycin trough 15-20  Plan: Increase Vancomycin 1250 gram iv Q 8 hours Pt is young and good renal function, might be hard to do Q 12 hr dosing. Might need 2.5g Q 12? Recheck vancomycin trough tomorrow or Wednesday  Thanks for allowing pharmacy to be a part of this patient's care.  Bayard Hugger, PharmD, BCPS  Clinical Pharmacist  Pager: [phone]   05/19/2015,1:06 PM

## 2015-05-19 NOTE — Progress Notes (Signed)
  Subjective: Less pressure L pelvic area  Objective: Vital signs in last 24 hours: Temp:  [98.6 F (37 C)] 98.6 F (37 C) (06/27 0422) Pulse Rate:  [91-98] 91 (06/27 0422) Resp:  [16-17] 16 (06/27 0422) BP: (140-142)/(80-88) 142/80 mmHg (06/27 0422) SpO2:  [98 %-100 %] 98 % (06/27 0422) Last BM Date: 05/18/15  Intake/Output from previous day: 06/26 0701 - 06/27 0700 In: 1140 [P.O.:720; IV Piggyback:400] Out: 950 [Urine:900; Drains:50] Intake/Output this shift:    General appearance: alert and cooperative Resp: clear to auscultation bilaterally GI: soft, 2 drains enter abscess - both with pink purulent fluid  Lab Results:   Recent Labs  05/17/15 0520 05/18/15 0356  WBC 17.6* 13.0*  HGB 9.3* 9.6*  HCT 27.9* 29.7*  PLT 446* 514*   BMET  Recent Labs  05/17/15 0520 05/18/15 0356  NA 132* 137  K 4.0 3.9  CL 98* 106  CO2 27 25  GLUCOSE 107* 161*  BUN 8 15  CREATININE 0.61 0.65  CALCIUM 7.7* 7.9*   PT/INR No results for input(s): LABPROT, INR in the last 72 hours. ABG No results for input(s): PHART, HCO3 in the last 72 hours.  Invalid input(s): PCO2, PO2  Studies/Results: No results found.  Anti-infectives: Anti-infectives    Start     Dose/Rate Route Frequency Ordered Stop   05/16/15 1000  vancomycin (VANCOCIN) IVPB 1000 mg/200 mL premix     1,000 mg 200 mL/hr over 60 Minutes Intravenous Every 8 hours 05/15/15 2215     05/16/15 0600  piperacillin-tazobactam (ZOSYN) IVPB 3.375 g  Status:  Discontinued     3.375 g 12.5 mL/hr over 240 Minutes Intravenous Every 8 hours 05/15/15 2215 05/16/15 1614   05/15/15 2300  vancomycin (VANCOCIN) IVPB 1000 mg/200 mL premix     1,000 mg 200 mL/hr over 60 Minutes Intravenous Every 1 hr x 2 05/15/15 2215 05/16/15 0059   05/15/15 2115  piperacillin-tazobactam (ZOSYN) IVPB 3.375 g     3.375 g 100 mL/hr over 30 Minutes Intravenous  Once 05/15/15 2106 05/15/15 2202      Assessment/Plan: L ileopsoas abscess - S/P  perc drain. Feeling somewhat better today. Blood CX MRSA, abscess CX Staph - likely will be MRSA. Continue Vanco. ID following.  LOS: 4 days    Kemora Pinard E 05/19/2015

## 2015-05-19 NOTE — Progress Notes (Signed)
[facility]  Date of Admission:  05/15/2015  Antibiotics: vancomycin  Subjective: Continues to have pain but feeling better  Objective: Temp:  [98.6 F (37 C)] 98.6 F (37 C) (06/27 0422) Pulse Rate:  [91-98] 91 (06/27 0422) Resp:  [16-17] 16 (06/27 0422) BP: (140-142)/(80-88) 142/80 mmHg (06/27 0422) SpO2:  [98 %-100 %] 98 % (06/27 0422)  General: awake, alert, nad Skin: multiple tattoos, no rashes Lungs: CTA B Cor: RRR Abdomen: soft, nt, + 4 drains Ext: no edema  Lab Results Lab Results  Component Value Date   WBC 13.0* 05/18/2015   HGB 9.6* 05/18/2015   HCT 29.7* 05/18/2015   MCV 82.5 05/18/2015   PLT 514* 05/18/2015    Lab Results  Component Value Date   CREATININE 0.65 05/18/2015   BUN 15 05/18/2015   NA 137 05/18/2015   K 3.9 05/18/2015   CL 106 05/18/2015   CO2 25 05/18/2015    Lab Results  Component Value Date   ALT 120* 05/17/2015   AST 60* 05/17/2015   ALKPHOS 341* 05/17/2015   BILITOT 0.9 05/17/2015      Microbiology: Recent Results (from the past 240 hour(s))  Culture, blood (routine x 2)     Status: None (Preliminary result)   Collection Time: 05/15/15  4:00 PM  Result Value Ref Range Status   Specimen Description BLOOD LEFT FOREARM DRAWN BY RN  Final   Special Requests   Final    BOTTLES DRAWN AEROBIC AND ANAEROBIC AEB=6CC ANA=4CC   Culture NO GROWTH 4 DAYS  Final   Report Status PENDING  Incomplete  Urine culture     Status: None   Collection Time: 05/15/15  4:13 PM  Result Value Ref Range Status   Specimen Description URINE, CLEAN CATCH  Final   Special Requests NONE  Final   Culture   Final    >=100,000 COLONIES/mL METHICILLIN RESISTANT STAPHYLOCOCCUS AUREUS Performed at [facility]    Report Status 05/19/2015 FINAL  Final   Organism ID, Bacteria METHICILLIN RESISTANT STAPHYLOCOCCUS AUREUS  Final      Susceptibility   Methicillin resistant staphylococcus aureus - MIC*    CIPROFLOXACIN >=8  RESISTANT Resistant     GENTAMICIN <=0.5 SENSITIVE Sensitive     NITROFURANTOIN <=16 SENSITIVE Sensitive     OXACILLIN >=4 RESISTANT Resistant     TETRACYCLINE <=1 SENSITIVE Sensitive     VANCOMYCIN 1 SENSITIVE Sensitive     TRIMETH/SULFA <=10 SENSITIVE Sensitive     CLINDAMYCIN <=0.25 SENSITIVE Sensitive     RIFAMPIN <=0.5 SENSITIVE Sensitive     Inducible Clindamycin NEGATIVE Sensitive     * >=100,000 COLONIES/mL METHICILLIN RESISTANT STAPHYLOCOCCUS AUREUS  Culture, blood (routine x 2)     Status: None   Collection Time: 05/15/15  4:25 PM  Result Value Ref Range Status   Specimen Description BLOOD RIGHT WRIST  Final   Special Requests BOTTLES DRAWN AEROBIC AND ANAEROBIC 8CC EACH  Final   Culture  Setup Time   Final    GRAM POSITIVE COCCI IN CLUSTERS IN BOTH AEROBIC AND ANAEROBIC BOTTLES CRITICAL RESULT CALLED TO, READ BACK BY AND VERIFIED WITH: HUDSON K. AT [facility] AT 1043A ON [id number] BY THOMPSON S.    Culture   Final    METHICILLIN RESISTANT STAPHYLOCOCCUS AUREUS Performed at [facility]    Report Status 05/18/2015 FINAL  Final   Organism ID, Bacteria METHICILLIN RESISTANT STAPHYLOCOCCUS AUREUS  Final      Susceptibility  Methicillin resistant staphylococcus aureus - MIC*    CIPROFLOXACIN >=8 RESISTANT Resistant     ERYTHROMYCIN >=8 RESISTANT Resistant     GENTAMICIN <=0.5 SENSITIVE Sensitive     OXACILLIN >=4 RESISTANT Resistant     TETRACYCLINE <=1 SENSITIVE Sensitive     VANCOMYCIN 1 SENSITIVE Sensitive     TRIMETH/SULFA <=10 SENSITIVE Sensitive     CLINDAMYCIN <=0.25 SENSITIVE Sensitive     RIFAMPIN <=0.5 SENSITIVE Sensitive     Inducible Clindamycin NEGATIVE Sensitive     * METHICILLIN RESISTANT STAPHYLOCOCCUS AUREUS  MRSA PCR Screening     Status: Abnormal   Collection Time: 05/16/15  9:08 AM  Result Value Ref Range Status   MRSA by PCR POSITIVE (A) NEGATIVE Final    Comment: RESULT CALLED TO, READ BACK BY AND VERIFIED WITH: C. HUDSON RN 10:50 05/16/15  ([email or web address])        The GeneXpert MRSA Assay (FDA approved for NASAL specimens only), is one component of a comprehensive MRSA colonization surveillance program. It is not intended to diagnose MRSA infection nor to guide or monitor treatment for MRSA infections.   Culture, routine-abscess     Status: None   Collection Time: 05/16/15  3:47 PM  Result Value Ref Range Status   Specimen Description ABSCESS  Final   Special Requests   Final    CRANIAL ASPECT OF THE LEFT ILIOPSOAS ABSCESS SYRINGE 1   Gram Stain   Final    ABUNDANT WBC PRESENT,BOTH PMN AND MONONUCLEAR NO SQUAMOUS EPITHELIAL CELLS SEEN MODERATE GRAM POSITIVE COCCI IN PAIRS IN CLUSTERS Performed at [facility]    Culture   Final    ABUNDANT METHICILLIN RESISTANT STAPHYLOCOCCUS AUREUS Note: RIFAMPIN AND GENTAMICIN SHOULD NOT BE USED AS SINGLE DRUGS FOR TREATMENT OF STAPH INFECTIONS. This organism is presumed to be Clindamycin resistant based on detection of inducible Clindamycin resistance. CRITICAL RESULT CALLED TO, READ BACK BY AND  VERIFIED WITH: TRANACE D@ 0856 ON [id number] BY [facility] Performed at [facility]    Report Status 05/19/2015 FINAL  Final   Organism ID, Bacteria METHICILLIN RESISTANT STAPHYLOCOCCUS AUREUS  Final      Susceptibility   Methicillin resistant staphylococcus aureus - MIC*    CLINDAMYCIN <=0.25 SENSITIVE Sensitive     ERYTHROMYCIN >=8 RESISTANT Resistant     GENTAMICIN <=0.5 SENSITIVE Sensitive     LEVOFLOXACIN 4 INTERMEDIATE Intermediate     OXACILLIN >=4 RESISTANT Resistant     PENICILLIN >=0.5 RESISTANT Resistant     RIFAMPIN <=0.5 SENSITIVE Sensitive     TRIMETH/SULFA <=10 SENSITIVE Sensitive     VANCOMYCIN 1 SENSITIVE Sensitive     TETRACYCLINE 2 SENSITIVE Sensitive     * ABUNDANT METHICILLIN RESISTANT STAPHYLOCOCCUS AUREUS    Studies/Results: No results found.  Assessment/Plan:  1) MRSA iliopsoas abscess with bacteremia - repeat blood cultures done yesterday, ngtd.   Recommend waiting at least 48 hours before placement of picc line. Will need at least 4 weeks of IV vancomycin.  TTE negative.  Will defer recommending TEE and keep him on IV for at least 4 weeks.   Could take Bactrim or doxycycline after completion of IV if needed.   Staci Righter, MD [facility] [email or web address] [phone] pager   [phone] cell 05/19/2015, 1:08 PM

## 2015-05-19 NOTE — Progress Notes (Addendum)
PROGRESS NOTE  [name] ZOX:[id number] DOB: [birth year] DOA: 05/15/2015 PCP: Pcp Not In System  Assessment/Plan: Left iliopsoas abscesses.  s/P perc drain. Feeling somewhat better today. Blood CX MRSA, abscess CX Staph - likely will be MRSA -denies risk factors for abscess:  denies any IV drug abuse.  -HIV non-reactive Infectious disease following Patient needs to have a PICC line placed, timing to be based on infectious disease recommendations 2-D echo negative for vegetations   Gram + cocci bacteremia -IV vanc -Infectious disease following  Elevated alkaline phosphatase, AST and LFT. This may be reflection of infection, versus an inflammatory process. Monitor closely.  Acute pain -PRN dilaudid/oxy  Hyponatremia Resolved, IV fluids discontinued  Leukocytosis -improving    Nicotine abuse  Nicotine cessation counseling done  Code Status: full Family Communication: patient gave permission for me to talk to his family Disposition Plan:  PICC line placement as per ID recommendations, and  recommend duration of antibiotics   Consultants:  IR  Procedures: CT IMAGE GUIDED DRAINAGE BY PERCUTANEOUS CATHETER [    HPI/Subjective: No overnight issues   Objective: Filed Vitals:   05/19/15 0422  BP: 142/80  Pulse: 91  Temp: 98.6 F (37 C)  Resp: 16    Intake/Output Summary (Last 24 hours) at 05/19/15 1121 Last data filed at 05/19/15 0423  Gross per 24 hour  Intake    900 ml  Output    950 ml  Net    -50 ml   Filed Weights   05/15/15 1340  Weight: 90.719 kg (200 lb)    Exam:  General appearance: alert and cooperative Resp: clear to auscultation bilaterally GI: soft, 2 drains enter abscess - both with pink purulent fluid   Data Reviewed: Basic Metabolic Panel:  Recent Labs Lab 05/15/15 1625 05/17/15 0520 05/18/15 0356  NA 129* 132* 137  K 4.5 4.0 3.9  CL 92* 98* 106  CO2 GLUCOSE 113* 107* 161*  BUN CREATININE 0.71  0.61 0.65  CALCIUM 8.5* 7.7* 7.9*   Liver Function Tests:  Recent Labs Lab 05/15/15 1625 05/17/15 0520  AST 69* 60*  ALT 174* 120*  ALKPHOS 381* 341*  BILITOT 1.2 0.9  PROT 7.6 6.2*  ALBUMIN 2.3* 1.7*    Recent Labs Lab 05/15/15 1625  LIPASE 18*   No results for input(s): AMMONIA in the last 168 hours. CBC:  Recent Labs Lab 05/15/15 1625 05/17/15 0520 05/18/15 0356  WBC 26.3* 17.6* 13.0*  NEUTROABS 22.7*  --   --   HGB 11.8* 9.3* 9.6*  HCT 35.1* 27.9* 29.7*  MCV 82.6 81.3 82.5  PLT 508* 446* 514*   Cardiac Enzymes: No results for input(s): CKTOTAL, CKMB, CKMBINDEX, TROPONINI in the last 168 hours. BNP (last 3 results) No results for input(s): BNP in the last 8760 hours.  ProBNP (last 3 results) No results for input(s): PROBNP in the last 8760 hours.  CBG: No results for input(s): GLUCAP in the last 168 hours.  Recent Results (from the past 240 hour(s))  Culture, blood (routine x 2)     Status: None (Preliminary result)   Collection Time: 05/15/15  4:00 PM  Result Value Ref Range Status   Specimen Description BLOOD LEFT FOREARM DRAWN BY RN  Final   Special Requests   Final    BOTTLES DRAWN AEROBIC AND ANAEROBIC AEB=6CC ANA=4CC   Culture NO GROWTH 4 DAYS  Final   Report Status PENDING  Incomplete  Urine culture  Status: None   Collection Time: 05/15/15  4:13 PM  Result Value Ref Range Status   Specimen Description URINE, CLEAN CATCH  Final   Special Requests NONE  Final   Culture   Final    >=100,000 COLONIES/mL METHICILLIN RESISTANT STAPHYLOCOCCUS AUREUS Performed at [facility]    Report Status 05/19/2015 FINAL  Final   Organism ID, Bacteria METHICILLIN RESISTANT STAPHYLOCOCCUS AUREUS  Final      Susceptibility   Methicillin resistant staphylococcus aureus - MIC*    CIPROFLOXACIN >=8 RESISTANT Resistant     GENTAMICIN <=0.5 SENSITIVE Sensitive     NITROFURANTOIN <=16 SENSITIVE Sensitive     OXACILLIN >=4 RESISTANT Resistant      TETRACYCLINE <=1 SENSITIVE Sensitive     VANCOMYCIN 1 SENSITIVE Sensitive     TRIMETH/SULFA <=10 SENSITIVE Sensitive     CLINDAMYCIN <=0.25 SENSITIVE Sensitive     RIFAMPIN <=0.5 SENSITIVE Sensitive     Inducible Clindamycin NEGATIVE Sensitive     * >=100,000 COLONIES/mL METHICILLIN RESISTANT STAPHYLOCOCCUS AUREUS  Culture, blood (routine x 2)     Status: None   Collection Time: 05/15/15  4:25 PM  Result Value Ref Range Status   Specimen Description BLOOD RIGHT WRIST  Final   Special Requests BOTTLES DRAWN AEROBIC AND ANAEROBIC 8CC EACH  Final   Culture  Setup Time   Final    GRAM POSITIVE COCCI IN CLUSTERS IN BOTH AEROBIC AND ANAEROBIC BOTTLES CRITICAL RESULT CALLED TO, READ BACK BY AND VERIFIED WITH: HUDSON K. AT [facility] AT 1043A ON [id number] BY THOMPSON S.    Culture   Final    METHICILLIN RESISTANT STAPHYLOCOCCUS AUREUS Performed at [facility]    Report Status 05/18/2015 FINAL  Final   Organism ID, Bacteria METHICILLIN RESISTANT STAPHYLOCOCCUS AUREUS  Final      Susceptibility   Methicillin resistant staphylococcus aureus - MIC*    CIPROFLOXACIN >=8 RESISTANT Resistant     ERYTHROMYCIN >=8 RESISTANT Resistant     GENTAMICIN <=0.5 SENSITIVE Sensitive     OXACILLIN >=4 RESISTANT Resistant     TETRACYCLINE <=1 SENSITIVE Sensitive     VANCOMYCIN 1 SENSITIVE Sensitive     TRIMETH/SULFA <=10 SENSITIVE Sensitive     CLINDAMYCIN <=0.25 SENSITIVE Sensitive     RIFAMPIN <=0.5 SENSITIVE Sensitive     Inducible Clindamycin NEGATIVE Sensitive     * METHICILLIN RESISTANT STAPHYLOCOCCUS AUREUS  MRSA PCR Screening     Status: Abnormal   Collection Time: 05/16/15  9:08 AM  Result Value Ref Range Status   MRSA by PCR POSITIVE (A) NEGATIVE Final    Comment: RESULT CALLED TO, READ BACK BY AND VERIFIED WITH: C. HUDSON RN 10:50 05/16/15 ([email or web address])        The GeneXpert MRSA Assay (FDA approved for NASAL specimens only), is one component of a comprehensive MRSA colonization surveillance  program. It is not intended to diagnose MRSA infection nor to guide or monitor treatment for MRSA infections.   Culture, routine-abscess     Status: None   Collection Time: 05/16/15  3:47 PM  Result Value Ref Range Status   Specimen Description ABSCESS  Final   Special Requests   Final    CRANIAL ASPECT OF THE LEFT ILIOPSOAS ABSCESS SYRINGE 1   Gram Stain   Final    ABUNDANT WBC PRESENT,BOTH PMN AND MONONUCLEAR NO SQUAMOUS EPITHELIAL CELLS SEEN MODERATE GRAM POSITIVE COCCI IN PAIRS IN CLUSTERS Performed at [facility]    Culture   Final  ABUNDANT METHICILLIN RESISTANT STAPHYLOCOCCUS AUREUS Note: RIFAMPIN AND GENTAMICIN SHOULD NOT BE USED AS SINGLE DRUGS FOR TREATMENT OF STAPH INFECTIONS. This organism is presumed to be Clindamycin resistant based on detection of inducible Clindamycin resistance. CRITICAL RESULT CALLED TO, READ BACK BY AND  VERIFIED WITH: TRANACE D@ 0856 ON [id number] BY [facility] Performed at [facility]    Report Status 05/19/2015 FINAL  Final   Organism ID, Bacteria METHICILLIN RESISTANT STAPHYLOCOCCUS AUREUS  Final      Susceptibility   Methicillin resistant staphylococcus aureus - MIC*    CLINDAMYCIN <=0.25 SENSITIVE Sensitive     ERYTHROMYCIN >=8 RESISTANT Resistant     GENTAMICIN <=0.5 SENSITIVE Sensitive     LEVOFLOXACIN 4 INTERMEDIATE Intermediate     OXACILLIN >=4 RESISTANT Resistant     PENICILLIN >=0.5 RESISTANT Resistant     RIFAMPIN <=0.5 SENSITIVE Sensitive     TRIMETH/SULFA <=10 SENSITIVE Sensitive     VANCOMYCIN 1 SENSITIVE Sensitive     TETRACYCLINE 2 SENSITIVE Sensitive     * ABUNDANT METHICILLIN RESISTANT STAPHYLOCOCCUS AUREUS     Studies: No results found.  Scheduled Meds: . Chlorhexidine Gluconate Cloth  6 each Topical Q0600  . mupirocin ointment  1 application Nasal BID  . naproxen  500 mg Oral BID WC  . traMADol  100 mg Oral 4 times per day  . vancomycin  1,000 mg Intravenous Q8H   Continuous Infusions:      Active Problems:   Iliopsoas abscess   Leukocytosis   Hyponatremia   Psoas abscess    Time spent: 25 min    [facility]  [facility] Pager [phone]. If 7PM-7AM, please contact night-coverage at [email or web address], password [facility] 05/19/2015, 11:21 AM  LOS: 4 days

## 2015-05-20 LAB — CULTURE, BLOOD (ROUTINE X 2): Culture: NO GROWTH

## 2015-05-20 MED ORDER — HYDROMORPHONE HCL 1 MG/ML IJ SOLN
1.0000 mg | Freq: Once | INTRAMUSCULAR | Status: AC
  Administered 2015-05-20: 1 mg via INTRAVENOUS
  Filled 2015-05-20: qty 1

## 2015-05-20 MED ORDER — DEXTROSE 5 % IV SOLN
500.0000 mg | Freq: Three times a day (TID) | INTRAVENOUS | Status: DC | PRN
  Filled 2015-05-20: qty 5

## 2015-05-20 MED ORDER — POLYETHYLENE GLYCOL 3350 17 G PO PACK
17.0000 g | PACK | Freq: Every day | ORAL | Status: DC
  Administered 2015-05-20 – 2015-05-22 (×3): 17 g via ORAL
  Filled 2015-05-20 (×2): qty 1

## 2015-05-20 NOTE — Progress Notes (Signed)
[facility]  Date of Admission:  05/15/2015  Antibiotics: vancomycin  Subjective: No significant pain  Objective: Temp:  [98 F (36.7 C)-98.5 F (36.9 C)] 98.5 F (36.9 C) (06/28 1252) Pulse Rate:  [88-90] 90 (06/28 1252) Resp:  [16-118] 18 (06/28 1252) BP: (118-132)/(68-84) 125/75 mmHg (06/28 1252) SpO2:  [97 %-100 %] 98 % (06/28 1252)  General: awake, alert, nad Skin: multiple tattoos, no rashes Lungs: CTA B Cor: RRR Abdomen: soft, nt, + drains Ext: no edema  Lab Results Lab Results  Component Value Date   WBC 13.0* 05/18/2015   HGB 9.6* 05/18/2015   HCT 29.7* 05/18/2015   MCV 82.5 05/18/2015   PLT 514* 05/18/2015    Lab Results  Component Value Date   CREATININE 0.65 05/18/2015   BUN 15 05/18/2015   NA 137 05/18/2015   K 3.9 05/18/2015   CL 106 05/18/2015   CO2 25 05/18/2015    Lab Results  Component Value Date   ALT 120* 05/17/2015   AST 60* 05/17/2015   ALKPHOS 341* 05/17/2015   BILITOT 0.9 05/17/2015      Microbiology: Recent Results (from the past 240 hour(s))  Culture, blood (routine x 2)     Status: None   Collection Time: 05/15/15  4:00 PM  Result Value Ref Range Status   Specimen Description BLOOD LEFT FOREARM DRAWN BY RN  Final   Special Requests   Final    BOTTLES DRAWN AEROBIC AND ANAEROBIC AEB=6CC ANA=4CC   Culture NO GROWTH 5 DAYS  Final   Report Status 05/20/2015 FINAL  Final  Urine culture     Status: None   Collection Time: 05/15/15  4:13 PM  Result Value Ref Range Status   Specimen Description URINE, CLEAN CATCH  Final   Special Requests NONE  Final   Culture   Final    >=100,000 COLONIES/mL METHICILLIN RESISTANT STAPHYLOCOCCUS AUREUS Performed at [facility]    Report Status 05/19/2015 FINAL  Final   Organism ID, Bacteria METHICILLIN RESISTANT STAPHYLOCOCCUS AUREUS  Final      Susceptibility   Methicillin resistant staphylococcus aureus - MIC*    CIPROFLOXACIN >=8 RESISTANT Resistant      GENTAMICIN <=0.5 SENSITIVE Sensitive     NITROFURANTOIN <=16 SENSITIVE Sensitive     OXACILLIN >=4 RESISTANT Resistant     TETRACYCLINE <=1 SENSITIVE Sensitive     VANCOMYCIN 1 SENSITIVE Sensitive     TRIMETH/SULFA <=10 SENSITIVE Sensitive     CLINDAMYCIN <=0.25 SENSITIVE Sensitive     RIFAMPIN <=0.5 SENSITIVE Sensitive     Inducible Clindamycin NEGATIVE Sensitive     * >=100,000 COLONIES/mL METHICILLIN RESISTANT STAPHYLOCOCCUS AUREUS  Culture, blood (routine x 2)     Status: None   Collection Time: 05/15/15  4:25 PM  Result Value Ref Range Status   Specimen Description BLOOD RIGHT WRIST  Final   Special Requests BOTTLES DRAWN AEROBIC AND ANAEROBIC 8CC EACH  Final   Culture  Setup Time   Final    GRAM POSITIVE COCCI IN CLUSTERS IN BOTH AEROBIC AND ANAEROBIC BOTTLES CRITICAL RESULT CALLED TO, READ BACK BY AND VERIFIED WITH: HUDSON K. AT [facility] AT 1043A ON [id number] BY THOMPSON S.    Culture   Final    METHICILLIN RESISTANT STAPHYLOCOCCUS AUREUS Performed at [facility]    Report Status 05/18/2015 FINAL  Final   Organism ID, Bacteria METHICILLIN RESISTANT STAPHYLOCOCCUS AUREUS  Final      Susceptibility   Methicillin  resistant staphylococcus aureus - MIC*    CIPROFLOXACIN >=8 RESISTANT Resistant     ERYTHROMYCIN >=8 RESISTANT Resistant     GENTAMICIN <=0.5 SENSITIVE Sensitive     OXACILLIN >=4 RESISTANT Resistant     TETRACYCLINE <=1 SENSITIVE Sensitive     VANCOMYCIN 1 SENSITIVE Sensitive     TRIMETH/SULFA <=10 SENSITIVE Sensitive     CLINDAMYCIN <=0.25 SENSITIVE Sensitive     RIFAMPIN <=0.5 SENSITIVE Sensitive     Inducible Clindamycin NEGATIVE Sensitive     * METHICILLIN RESISTANT STAPHYLOCOCCUS AUREUS  MRSA PCR Screening     Status: Abnormal   Collection Time: 05/16/15  9:08 AM  Result Value Ref Range Status   MRSA by PCR POSITIVE (A) NEGATIVE Final    Comment: RESULT CALLED TO, READ BACK BY AND VERIFIED WITH: C. HUDSON RN 10:50 05/16/15 ([email or web address])        The  GeneXpert MRSA Assay (FDA approved for NASAL specimens only), is one component of a comprehensive MRSA colonization surveillance program. It is not intended to diagnose MRSA infection nor to guide or monitor treatment for MRSA infections.   Culture, routine-abscess     Status: None   Collection Time: 05/16/15  3:47 PM  Result Value Ref Range Status   Specimen Description ABSCESS  Final   Special Requests   Final    CRANIAL ASPECT OF THE LEFT ILIOPSOAS ABSCESS SYRINGE 1   Gram Stain   Final    ABUNDANT WBC PRESENT,BOTH PMN AND MONONUCLEAR NO SQUAMOUS EPITHELIAL CELLS SEEN MODERATE GRAM POSITIVE COCCI IN PAIRS IN CLUSTERS Performed at [facility]    Culture   Final    ABUNDANT METHICILLIN RESISTANT STAPHYLOCOCCUS AUREUS Note: RIFAMPIN AND GENTAMICIN SHOULD NOT BE USED AS SINGLE DRUGS FOR TREATMENT OF STAPH INFECTIONS. This organism is presumed to be Clindamycin resistant based on detection of inducible Clindamycin resistance. CRITICAL RESULT CALLED TO, READ BACK BY AND  VERIFIED WITH: TRANACE D@ 0856 ON [id number] BY [facility] Performed at [facility]    Report Status 05/19/2015 FINAL  Final   Organism ID, Bacteria METHICILLIN RESISTANT STAPHYLOCOCCUS AUREUS  Final      Susceptibility   Methicillin resistant staphylococcus aureus - MIC*    CLINDAMYCIN <=0.25 SENSITIVE Sensitive     ERYTHROMYCIN >=8 RESISTANT Resistant     GENTAMICIN <=0.5 SENSITIVE Sensitive     LEVOFLOXACIN 4 INTERMEDIATE Intermediate     OXACILLIN >=4 RESISTANT Resistant     PENICILLIN >=0.5 RESISTANT Resistant     RIFAMPIN <=0.5 SENSITIVE Sensitive     TRIMETH/SULFA <=10 SENSITIVE Sensitive     VANCOMYCIN 1 SENSITIVE Sensitive     TETRACYCLINE 2 SENSITIVE Sensitive     * ABUNDANT METHICILLIN RESISTANT STAPHYLOCOCCUS AUREUS  Culture, blood (routine x 2)     Status: None (Preliminary result)   Collection Time: 05/18/15 12:30 PM  Result Value Ref Range Status   Specimen Description BLOOD LEFT  HAND  Final   Special Requests BOTTLES DRAWN AEROBIC AND ANAEROBIC 5CC  Final   Culture NO GROWTH 2 DAYS  Final   Report Status PENDING  Incomplete  Culture, blood (routine x 2)     Status: None (Preliminary result)   Collection Time: 05/18/15 12:40 PM  Result Value Ref Range Status   Specimen Description BLOOD LEFT HAND  Final   Special Requests BOTTLES DRAWN AEROBIC AND ANAEROBIC 5CC  Final   Culture NO GROWTH 2 DAYS  Final   Report Status PENDING  Incomplete    Studies/Results: No  results found.  Assessment/Plan:  1) MRSA iliopsoas abscess with bacteremia - repeat blood cultures ngtd.   Will need at least 4 weeks of IV vancomycin.  TTE negative.    Will order picc line  Lorice Lafave, Molly Maduro, MD [facility] [email or web address] [phone] pager   [phone] cell 05/20/2015, 2:07 PM

## 2015-05-20 NOTE — Progress Notes (Signed)
Patient ID: [name], male   DOB: January 15, 1976, 39 y.o.   MRN: [id number]    Subjective: Back pain is better, but having some buttock pain and pain around his drains.  No BM in 3 days.  Drinking prune juice  Objective: Vital signs in last 24 hours: Temp:  [98 F (36.7 C)-98.5 F (36.9 C)] 98 F (36.7 C) (06/28 0339) Pulse Rate:  [88-90] 90 (06/28 0339) Resp:  [16-118] 17 (06/28 0339) BP: (118-132)/(68-84) 118/68 mmHg (06/28 0339) SpO2:  [97 %-100 %] 97 % (06/28 0339) Last BM Date: 05/19/15  Intake/Output from previous day: 06/27 0701 - 06/28 0700 In: 1470 [P.O.:1200; IV Piggyback:250] Out: 2525 [Urine:2450; Drains:75] Intake/Output this shift: Total I/O In: 240 [P.O.:240] Out: -   PE: Abd: soft, tender around drains, Both drains with pink, cloudy output  Lab Results:   Recent Labs  05/18/15 0356  WBC 13.0*  HGB 9.6*  HCT 29.7*  PLT 514*   BMET  Recent Labs  05/18/15 0356  NA 137  K 3.9  CL 106  CO2 25  GLUCOSE 161*  BUN 15  CREATININE 0.65  CALCIUM 7.9*   PT/INR No results for input(s): LABPROT, INR in the last 72 hours. CMP     Component Value Date/Time   NA 137 05/18/2015 0356   K 3.9 05/18/2015 0356   CL 106 05/18/2015 0356   CO2 25 05/18/2015 0356   GLUCOSE 161* 05/18/2015 0356   BUN 15 05/18/2015 0356   CREATININE 0.65 05/18/2015 0356   CALCIUM 7.9* 05/18/2015 0356   PROT 6.2* 05/17/2015 0520   ALBUMIN 1.7* 05/17/2015 0520   AST 60* 05/17/2015 0520   ALT 120* 05/17/2015 0520   ALKPHOS 341* 05/17/2015 0520   BILITOT 0.9 05/17/2015 0520   GFRNONAA >60 05/18/2015 0356   GFRAA >60 05/18/2015 0356   Lipase     Component Value Date/Time   LIPASE 18* 05/15/2015 1625       Studies/Results: No results found.  Anti-infectives: Anti-infectives    Start     Dose/Rate Route Frequency Ordered Stop   05/19/15 1800  vancomycin (VANCOCIN) 1,250 mg in sodium chloride 0.9 % 250 mL IVPB     1,250 mg 166.7 mL/hr over 90 Minutes Intravenous  Every 8 hours 05/19/15 1316     05/16/15 1000  vancomycin (VANCOCIN) IVPB 1000 mg/200 mL premix  Status:  Discontinued     1,000 mg 200 mL/hr over 60 Minutes Intravenous Every 8 hours 05/15/15 2215 05/19/15 1316   05/16/15 0600  piperacillin-tazobactam (ZOSYN) IVPB 3.375 g  Status:  Discontinued     3.375 g 12.5 mL/hr over 240 Minutes Intravenous Every 8 hours 05/15/15 2215 05/16/15 1614   05/15/15 2300  vancomycin (VANCOCIN) IVPB 1000 mg/200 mL premix     1,000 mg 200 mL/hr over 60 Minutes Intravenous Every 1 hr x 2 05/15/15 2215 05/16/15 0059   05/15/15 2115  piperacillin-tazobactam (ZOSYN) IVPB 3.375 g     3.375 g 100 mL/hr over 30 Minutes Intravenous  Once 05/15/15 2106 05/15/15 2202       Assessment/Plan   L ileopsoas abscess - s/p perc drain -cx shows MRSA.  Defer course of treatment to ID -cont drains, repeat CT in couple of days -added miralax for bowel regimen.  LOS: 5 days    [name] E 05/20/2015, 9:29 AM Pager: [phone]

## 2015-05-20 NOTE — Progress Notes (Signed)
Referring Physician(s): [facility] CCS  Subjective:  ileopsoas abscess LLQ drains x 2 placed 6/24 Better daily Drains still sore   Allergies: Vicodin and Adhesive  Medications: Prior to Admission medications   Medication Sig Start Date End Date Taking? Authorizing Provider  diphenhydramine-acetaminophen (TYLENOL PM) 25-500 MG TABS Take 1 tablet by mouth at bedtime as needed.   Yes Historical Provider, MD  cyclobenzaprine (FLEXERIL) 5 MG tablet Take 1 tablet (5 mg total) by mouth 3 (three) times daily as needed for muscle spasms. Patient not taking: Reported on 05/15/2015 05/04/15   Myra Rude, MD  meloxicam (MOBIC) 7.5 MG tablet Take 1 tablet (7.5 mg total) by mouth daily. Patient not taking: Reported on 05/15/2015 05/04/15   Myra Rude, MD  predniSONE (DELTASONE) 10 MG tablet Take 2 tablets (20 mg total) by mouth 2 (two) times daily. Patient not taking: Reported on 05/15/2015 05/05/15   Geoffery Lyons, MD     Vital Signs: BP 118/68 mmHg  Pulse 90  Temp(Src) 98 F (36.7 C) (Oral)  Resp 17  Ht 6' (1.829 m)  Wt 200 lb (90.719 kg)  BMI 27.12 kg/m2  SpO2 97%  Physical Exam  Constitutional: He appears well-developed.  Abdominal: Soft. There is tenderness.  Sites of drains sl tender Clean and dry Output both blood tinged/ thick fluid Cx +staph 25 cc #1 75 cc #2  afeb Wbc decreasing  Nursing note and vitals reviewed.   Imaging: Ct Image Guided Drainage By Percutaneous Catheter  05/16/2015   INDICATION: History of back pain for 1 month with preceding lumbar spine MRI and contrast-enhanced abdominal CT demonstrating a large left sided psoas and iliacus abscess. Please perform CT-guided percutaneous drainage catheter placement.  EXAM: CT IMAGE GUIDED DRAINAGE BY PERCUTANEOUS CATHETER x2  COMPARISON:  Lumbar spine MRI - 05/15/2015; CT abdomen pelvis - 05/15/2015  MEDICATIONS: The patient is currently admitted to the hospital and receiving intravenous antibiotics. The  antibiotics were administered within an appropriate time frame prior to the initiation of the procedure.  ANESTHESIA/SEDATION: Fentanyl 200 mcg IV; Versed 3 mg IV  Total Moderate Sedation time  30 minutes  CONTRAST:  None  COMPLICATIONS: None immediate  PROCEDURE: Informed written consent was obtained from the patient after a discussion of the risks, benefits and alternatives to treatment. The patient was placed supine on the CT gantry and a pre procedural CT was performed re-demonstrating the known abscess/fluid collection within the caudal aspect of the iliopsoas and iliacus musculature with dominant cranial component measuring approximately 5.1 x 6.0 cm (image 22, series 2) and dominant caudal component measuring approximately 5.9 x 3.9 cm (image 36, series 2). The procedure was planned. A timeout was performed prior to the initiation of the procedure.  The skin overlying the anterior aspect of the left lower abdomen was prepped and draped in the usual sterile fashion.  Attention was initially paid towards the dominant caudal component located primarily within in the caudal aspect of the iliopsoas musculature. The overlying soft tissues were anesthetized with 1% lidocaine with epinephrine. Appropriate trajectory was planned with the use of a 22 gauge spinal needle. An 18 gauge trocar needle was advanced into the abscess/fluid collection and a short Amplatz super stiff wire was coiled within the collection. Appropriate positioning was confirmed with a limited CT scan. The tract was serially dilated allowing placement of a 10 [place] all-purpose drainage catheter. Appropriate positioning was confirmed with a limited postprocedural CT scan.  A total of approximately 110 cc of  purulent fluid was aspirated. The tube was connected to a JP bulb and sutured in place.  Following the placement of the initial percutaneous drainage catheter, CT imaging was performed demonstrating grossly unchanged size and appearance of the  separate dominant component located within the iliacus musculature and as such the decision was made to place an additional percutaneous drainage catheter.  The overlying soft tissues were anesthetized with 1% lidocaine with epinephrine. The overlying soft tissues were anesthetized with 1% lidocaine with epinephrine. Appropriate trajectory was planned with the use of a 22 gauge spinal needle. An 18 gauge trocar needle was advanced into the abscess/fluid collection and a short Amplatz super stiff wire was coiled within the collection. Appropriate positioning was confirmed with a limited CT scan. The tract was serially dilated allowing placement of a 10 [place] all-purpose drainage catheter. Appropriate positioning was confirmed with a limited postprocedural CT scan.  A total of approximately 40 cc of purulent material was aspirated from the separate fluid collection. The tube was connected to a JP bulb and sutured in place.  Dressings were placed. The patient tolerated the procedure well without immediate postprocedural complication.  IMPRESSION: 1. Successful CT guided placement of a 10 [place] all purpose drain catheter into the caudal aspect of the left iliopsoas musculature with aspiration of approximately 110 mL of purulent fluid. Samples were sent to the laboratory as requested by the ordering clinical team. 2. Successful CT-guided placement of a 10 French all-purpose drainage catheter into the dominant component within the left iliacus musculature with aspiration of approximately 40 cc of purulent material. Samples were sent to the laboratory as requested by the ordering clinical team.   Electronically Signed   By: Simonne Come M.D.   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 Please perform  CT-guided percutaneous drainage catheter placement.  EXAM: CT IMAGE GUIDED DRAINAGE BY PERCUTANEOUS CATHETER x2  COMPARISON:  Lumbar spine MRI - 05/15/2015; CT abdomen pelvis - 05/15/2015  MEDICATIONS: The patient is currently admitted to the hospital and receiving intravenous antibiotics. The antibiotics were administered within an appropriate time frame prior to the initiation of the procedure.  ANESTHESIA/SEDATION: Fentanyl 200 mcg IV; Versed 3 mg IV  Total Moderate Sedation time  30 minutes  CONTRAST:  None  COMPLICATIONS: None immediate  PROCEDURE: Informed written consent was obtained from the patient after a discussion of the risks, benefits and alternatives to treatment. The patient was placed supine on the CT gantry and a pre procedural CT was performed re-demonstrating the known abscess/fluid collection within the caudal aspect of the iliopsoas and iliacus musculature with dominant cranial component measuring approximately 5.1 x 6.0 cm (image 22, series 2) and dominant caudal component measuring approximately 5.9 x 3.9 cm (image 36, series 2). The procedure was planned. A timeout was performed prior to the initiation of the procedure.  The skin overlying the anterior aspect of the left lower abdomen was prepped and draped in the usual sterile fashion.  Attention was initially paid towards the dominant caudal component located primarily within in the caudal aspect of the iliopsoas musculature. The overlying soft tissues were anesthetized with 1% lidocaine with epinephrine. Appropriate trajectory was planned with the use of a 22 gauge spinal needle. An 18 gauge trocar needle was advanced into the abscess/fluid collection and a  short Amplatz super stiff wire was coiled within the collection. Appropriate positioning was confirmed with a limited CT scan. The tract was serially dilated allowing placement of a 10 [place] all-purpose drainage catheter. Appropriate positioning was confirmed with a limited  postprocedural CT scan.  A total of approximately 110 cc of purulent fluid was aspirated. The tube was connected to a JP bulb and sutured in place.  Following the placement of the initial percutaneous drainage catheter, CT imaging was performed demonstrating grossly unchanged size and appearance of the separate dominant component located within the iliacus musculature and as such the decision was made to place an additional percutaneous drainage catheter.  The overlying soft tissues were anesthetized with 1% lidocaine with epinephrine. The overlying soft tissues were anesthetized with 1% lidocaine with epinephrine. Appropriate trajectory was planned with the use of a 22 gauge spinal needle. An 18 gauge trocar needle was advanced into the abscess/fluid collection and a short Amplatz super stiff wire was coiled within the collection. Appropriate positioning was confirmed with a limited CT scan. The tract was serially dilated allowing placement of a 10 [place] all-purpose drainage catheter. Appropriate positioning was confirmed with a limited postprocedural CT scan.  A total of approximately 40 cc of purulent material was aspirated from the separate fluid collection. The tube was connected to a JP bulb and sutured in place.  Dressings were placed. The patient tolerated the procedure well without immediate postprocedural complication.  IMPRESSION: 1. Successful CT guided placement of a 10 [place] all purpose drain catheter into the caudal aspect of the left iliopsoas musculature with aspiration of approximately 110 mL of purulent fluid. Samples were sent to the laboratory as requested by the ordering clinical team. 2. Successful CT-guided placement of a 10 French all-purpose drainage catheter into the dominant component within the left iliacus musculature with aspiration of approximately 40 cc of purulent material. Samples were sent to the laboratory as requested by the ordering clinical team.   Electronically Signed    By: Simonne ComeJohn  Watts M.D.   On: 05/16/2015 16:42    Labs:  CBC:  Recent Labs  05/15/15 1625 05/17/15 0520 05/18/15 0356  WBC 26.3* 17.6* 13.0*  HGB 11.8* 9.3* 9.6*  HCT 35.1* 27.9* 29.7*  PLT 508* 446* 514*    COAGS:  Recent Labs  05/15/15 1625  INR 1.33    BMP:  Recent Labs  05/15/15 1625 05/17/15 0520 05/18/15 0356  NA 129* 132* 137  K 4.5 4.0 3.9  CL 92* 98* 106  CO2 27 27 25   GLUCOSE 113* 107* 161*  BUN 15 8 15   CALCIUM 8.5* 7.7* 7.9*  CREATININE 0.71 0.61 0.65  GFRNONAA >60 >60 >60  GFRAA >60 >60 >60    LIVER FUNCTION TESTS:  Recent Labs  05/15/15 1625 05/17/15 0520  BILITOT 1.2 0.9  AST 69* 60*  ALT 174* 120*  ALKPHOS 381* 341*  PROT 7.6 6.2*  ALBUMIN 2.3* 1.7*    Assessment and Plan:  L ileopsoas abscess Drains intact BC pending Plan per CCS Can be followed at IR drain clinic   Signed: Julisa Flippo A 05/20/2015, 9:16 AM   I spent a total of 15 Minutes in face to face in clinical consultation/evaluation, greater than 50% of which was counseling/coordinating care for LLQ abscess drains

## 2015-05-20 NOTE — Progress Notes (Signed)
PROGRESS NOTE  [name] ZOX:[id number] DOB: [birth year] DOA: 05/15/2015 PCP: Pcp Not In System  Assessment/Plan: Left iliopsoas abscesses.  s/P perc drain, being followed by interventional radiology. Feeling somewhat better today. Blood CX MRSA, abscess CX Staph - likely will be MRSA -denies risk factors for abscess:  denies any IV drug abuse.  -HIV non-reactive Infectious disease following Patient needs to have a PICC line placed, timing to be based on infectious disease recommendations 2-D echo negative for vegetations Can be followed at IR drain clinic  Gram + cocci bacteremia Will need at least 4 weeks of IV vancomycin -Infectious disease following Infectious disease to defer recommending TEE and keep him on IV for at least 4 weeks.    Elevated alkaline phosphatase, AST and LFT. This may be reflection of infection, versus an inflammatory process. Monitor closely.  Acute pain -PRN dilaudid/oxy Add Robaxin  Hyponatremia Resolved, IV fluids discontinued  Leukocytosis -improving    Nicotine abuse  Nicotine cessation counseling done  Code Status: full Family Communication: patient gave permission for me to talk to his family Disposition Plan:  PICC line placement as per ID recommendations, and  recommend duration of antibiotics   Consultants:  IR  Procedures: CT IMAGE GUIDED DRAINAGE BY PERCUTANEOUS CATHETER [    HPI/Subjective: Complaining of back pain, requesting escalation in his pain regimen, pain radiating down into his buttock, constipated   Objective: Filed Vitals:   05/20/15 0339  BP: 118/68  Pulse: 90  Temp: 98 F (36.7 C)  Resp: 17    Intake/Output Summary (Last 24 hours) at 05/20/15 1118 Last data filed at 05/20/15 0900  Gross per 24 hour  Intake   1230 ml  Output   1825 ml  Net   -595 ml   Filed Weights   05/15/15 1340  Weight: 90.719 kg (200 lb)    Exam:  General appearance: alert and cooperative Resp: clear to  auscultation bilaterally GI: soft, 2 drains enter abscess - both with pink purulent fluid   Data Reviewed: Basic Metabolic Panel:  Recent Labs Lab 05/15/15 1625 05/17/15 0520 05/18/15 0356  NA 129* 132* 137  K 4.5 4.0 3.9  CL 92* 98* 106  CO2 GLUCOSE 113* 107* 161*  BUN CREATININE 0.71 0.61 0.65  CALCIUM 8.5* 7.7* 7.9*   Liver Function Tests:  Recent Labs Lab 05/15/15 1625 05/17/15 0520  AST 69* 60*  ALT 174* 120*  ALKPHOS 381* 341*  BILITOT 1.2 0.9  PROT 7.6 6.2*  ALBUMIN 2.3* 1.7*    Recent Labs Lab 05/15/15 1625  LIPASE 18*   No results for input(s): AMMONIA in the last 168 hours. CBC:  Recent Labs Lab 05/15/15 1625 05/17/15 0520 05/18/15 0356  WBC 26.3* 17.6* 13.0*  NEUTROABS 22.7*  --   --   HGB 11.8* 9.3* 9.6*  HCT 35.1* 27.9* 29.7*  MCV 82.6 81.3 82.5  PLT 508* 446* 514*   Cardiac Enzymes: No results for input(s): CKTOTAL, CKMB, CKMBINDEX, TROPONINI in the last 168 hours. BNP (last 3 results) No results for input(s): BNP in the last 8760 hours.  ProBNP (last 3 results) No results for input(s): PROBNP in the last 8760 hours.  CBG: No results for input(s): GLUCAP in the last 168 hours.  Recent Results (from the past 240 hour(s))  Culture, blood (routine x 2)     Status: None   Collection Time: 05/15/15  4:00 PM  Result Value Ref Range Status  Specimen Description BLOOD LEFT FOREARM DRAWN BY RN  Final   Special Requests   Final    BOTTLES DRAWN AEROBIC AND ANAEROBIC AEB=6CC ANA=4CC   Culture NO GROWTH 5 DAYS  Final   Report Status 05/20/2015 FINAL  Final  Urine culture     Status: None   Collection Time: 05/15/15  4:13 PM  Result Value Ref Range Status   Specimen Description URINE, CLEAN CATCH  Final   Special Requests NONE  Final   Culture   Final    >=100,000 COLONIES/mL METHICILLIN RESISTANT STAPHYLOCOCCUS AUREUS [facility]    Report Status 05/19/2015 FINAL  Final   Organism ID,  Bacteria METHICILLIN RESISTANT STAPHYLOCOCCUS AUREUS  Final      Susceptibility   Methicillin resistant staphylococcus aureus - MIC*    CIPROFLOXACIN >=8 RESISTANT Resistant     GENTAMICIN <=0.5 SENSITIVE Sensitive     NITROFURANTOIN <=16 SENSITIVE Sensitive     OXACILLIN >=4 RESISTANT Resistant     TETRACYCLINE <=1 SENSITIVE Sensitive     VANCOMYCIN 1 SENSITIVE Sensitive     TRIMETH/SULFA <=10 SENSITIVE Sensitive     CLINDAMYCIN <=0.25 SENSITIVE Sensitive     RIFAMPIN <=0.5 SENSITIVE Sensitive     Inducible Clindamycin NEGATIVE Sensitive     * >=100,000 COLONIES/mL METHICILLIN RESISTANT STAPHYLOCOCCUS AUREUS  Culture, blood (routine x 2)     Status: None   Collection Time: 05/15/15  4:25 PM  Result Value Ref Range Status   Specimen Description BLOOD RIGHT WRIST  Final   Special Requests BOTTLES DRAWN AEROBIC AND ANAEROBIC 8CC EACH  Final   Culture  Setup Time   Final    GRAM POSITIVE COCCI IN CLUSTERS IN BOTH AEROBIC AND ANAEROBIC BOTTLES CRITICAL RESULT CALLED TO, READ BACK BY AND VERIFIED WITH: HUDSON K. AT [facility] AT 1043A ON [id number] BY THOMPSON S.    Culture   Final    METHICILLIN RESISTANT STAPHYLOCOCCUS AUREUS [facility]    Report Status 05/18/2015 FINAL  Final   Organism ID, Bacteria METHICILLIN RESISTANT STAPHYLOCOCCUS AUREUS  Final      Susceptibility   Methicillin resistant staphylococcus aureus - MIC*    CIPROFLOXACIN >=8 RESISTANT Resistant     ERYTHROMYCIN >=8 RESISTANT Resistant     GENTAMICIN <=0.5 SENSITIVE Sensitive     OXACILLIN >=4 RESISTANT Resistant     TETRACYCLINE <=1 SENSITIVE Sensitive     VANCOMYCIN 1 SENSITIVE Sensitive     TRIMETH/SULFA <=10 SENSITIVE Sensitive     CLINDAMYCIN <=0.25 SENSITIVE Sensitive     RIFAMPIN <=0.5 SENSITIVE Sensitive     Inducible Clindamycin NEGATIVE Sensitive     * METHICILLIN RESISTANT STAPHYLOCOCCUS AUREUS  MRSA PCR Screening     Status: Abnormal   Collection Time: 05/16/15  9:08 AM  Result Value  Ref Range Status   MRSA by PCR POSITIVE (A) NEGATIVE Final    Comment: RESULT CALLED TO, READ BACK BY AND VERIFIED WITH: C. HUDSON RN 10:50 05/16/15 ([email or web address])        The GeneXpert MRSA Assay (FDA approved for NASAL specimens only), is one component of a comprehensive MRSA colonization surveillance program. It is not intended to diagnose MRSA infection nor to guide or monitor treatment for MRSA infections.   Culture, routine-abscess     Status: None   Collection Time: 05/16/15  3:47 PM  Result Value Ref Range Status   Specimen Description ABSCESS  Final   Special Requests   Final    CRANIAL  ASPECT OF THE LEFT ILIOPSOAS ABSCESS SYRINGE 1   Gram Stain   Final    ABUNDANT WBC PRESENT,BOTH PMN AND MONONUCLEAR NO SQUAMOUS EPITHELIAL CELLS SEEN MODERATE GRAM POSITIVE COCCI IN PAIRS IN CLUSTERS Performed at [facility]    Culture   Final    ABUNDANT METHICILLIN RESISTANT STAPHYLOCOCCUS AUREUS Note: RIFAMPIN AND GENTAMICIN SHOULD NOT BE USED AS SINGLE DRUGS FOR TREATMENT OF STAPH INFECTIONS. This organism is presumed to be Clindamycin resistant based on detection of inducible Clindamycin resistance. CRITICAL RESULT CALLED TO, READ BACK BY AND  VERIFIED WITH: TRANACE D@ 0856 ON [id number] BY [facility] Performed at [facility]    Report Status 05/19/2015 FINAL  Final   Organism ID, Bacteria METHICILLIN RESISTANT STAPHYLOCOCCUS AUREUS  Final      Susceptibility   Methicillin resistant staphylococcus aureus - MIC*    CLINDAMYCIN <=0.25 SENSITIVE Sensitive     ERYTHROMYCIN >=8 RESISTANT Resistant     GENTAMICIN <=0.5 SENSITIVE Sensitive     LEVOFLOXACIN 4 INTERMEDIATE Intermediate     OXACILLIN >=4 RESISTANT Resistant     PENICILLIN >=0.5 RESISTANT Resistant     RIFAMPIN <=0.5 SENSITIVE Sensitive     TRIMETH/SULFA <=10 SENSITIVE Sensitive     VANCOMYCIN 1 SENSITIVE Sensitive     TETRACYCLINE 2 SENSITIVE Sensitive     * ABUNDANT METHICILLIN RESISTANT STAPHYLOCOCCUS AUREUS    Culture, blood (routine x 2)     Status: None (Preliminary result)   Collection Time: 05/18/15 12:30 PM  Result Value Ref Range Status   Specimen Description BLOOD LEFT HAND  Final   Special Requests BOTTLES DRAWN AEROBIC AND ANAEROBIC 5CC  Final   Culture NO GROWTH 1 DAY  Final   Report Status PENDING  Incomplete  Culture, blood (routine x 2)     Status: None (Preliminary result)   Collection Time: 05/18/15 12:40 PM  Result Value Ref Range Status   Specimen Description BLOOD LEFT HAND  Final   Special Requests BOTTLES DRAWN AEROBIC AND ANAEROBIC 5CC  Final   Culture NO GROWTH 1 DAY  Final   Report Status PENDING  Incomplete     Studies: No results found.  Scheduled Meds: . Chlorhexidine Gluconate Cloth  6 each Topical Q0600  . mupirocin ointment  1 application Nasal BID  . polyethylene glycol  17 g Oral Daily  . traMADol  100 mg Oral 4 times per day  . vancomycin  1,250 mg Intravenous Q8H   Continuous Infusions:     Active Problems:   Iliopsoas abscess   Leukocytosis   Hyponatremia   Psoas abscess    Time spent: 25 min    [facility]  [facility] Pager [phone]. If 7PM-7AM, please contact night-coverage at [email or web address], password [facility] 05/20/2015, 11:18 AM  LOS: 5 days

## 2015-05-20 NOTE — Care Management Note (Signed)
Case Management Note  Patient Details  Name: [name] MRN: [id number] Date of Birth: [birth year]  Subjective/Objective:      Iliopsoas abscess              Action/Plan: Spoke with patient and his wife about home IV antibiotics. They are agreeable with learning to give IV antibiotics and chose [facility] pending orders. Will continue to follow.  Expected Discharge Date:  05/20/15               Expected Discharge Plan:  Home w Home Health Services  In-House Referral:  Financial Counselor  Discharge planning Services  CM Consult  Post Acute Care Choice:  Home Health, Durable Medical Equipment Choice offered to:  Patient  DME Arranged:  IV pump/equipment DME Agency:  [facility]  HH Arranged:  RN, IV Antibiotics HH Agency:  [facility]  Status of Service:  In process, will continue to follow  Medicare Important Message Given:    Date Medicare IM Given:    Medicare IM give by:    Date Additional Medicare IM Given:    Additional Medicare Important Message give by:     If discussed at Long Length of Stay Meetings, dates discussed:    Additional Comments:  Monica BectonKrieg, Canary Fister Watson, RN 05/20/2015, 2:56 PM

## 2015-05-21 DIAGNOSIS — D72829 Elevated white blood cell count, unspecified: Secondary | ICD-10-CM

## 2015-05-21 DIAGNOSIS — Z959 Presence of cardiac and vascular implant and graft, unspecified: Secondary | ICD-10-CM

## 2015-05-21 LAB — COMPREHENSIVE METABOLIC PANEL
ALT: 69 U/L — ABNORMAL HIGH (ref 17–63)
AST: 27 U/L (ref 15–41)
Albumin: 2 g/dL — ABNORMAL LOW (ref 3.5–5.0)
Alkaline Phosphatase: 267 U/L — ABNORMAL HIGH (ref 38–126)
Anion gap: 9 (ref 5–15)
BILIRUBIN TOTAL: 0.4 mg/dL (ref 0.3–1.2)
BUN: 8 mg/dL (ref 6–20)
CO2: 31 mmol/L (ref 22–32)
Calcium: 8.7 mg/dL — ABNORMAL LOW (ref 8.9–10.3)
Chloride: 94 mmol/L — ABNORMAL LOW (ref 101–111)
Creatinine, Ser: 0.7 mg/dL (ref 0.61–1.24)
Glucose, Bld: 95 mg/dL (ref 65–99)
Potassium: 4.7 mmol/L (ref 3.5–5.1)
Sodium: 134 mmol/L — ABNORMAL LOW (ref 135–145)
TOTAL PROTEIN: 7.4 g/dL (ref 6.5–8.1)

## 2015-05-21 LAB — CBC
HEMATOCRIT: 30.1 % — AB (ref 39.0–52.0)
Hemoglobin: 9.6 g/dL — ABNORMAL LOW (ref 13.0–17.0)
MCH: 26.7 pg (ref 26.0–34.0)
MCHC: 31.9 g/dL (ref 30.0–36.0)
MCV: 83.8 fL (ref 78.0–100.0)
Platelets: 571 10*3/uL — ABNORMAL HIGH (ref 150–400)
RBC: 3.59 MIL/uL — ABNORMAL LOW (ref 4.22–5.81)
RDW: 14.3 % (ref 11.5–15.5)
WBC: 11.4 10*3/uL — ABNORMAL HIGH (ref 4.0–10.5)

## 2015-05-21 MED ORDER — SODIUM CHLORIDE 0.9 % IJ SOLN
10.0000 mL | INTRAMUSCULAR | Status: DC | PRN
  Administered 2015-05-23: 10 mL
  Filled 2015-05-21: qty 40

## 2015-05-21 MED ORDER — DOCUSATE SODIUM 100 MG PO CAPS
100.0000 mg | ORAL_CAPSULE | Freq: Two times a day (BID) | ORAL | Status: DC
  Administered 2015-05-21 – 2015-05-23 (×5): 100 mg via ORAL
  Filled 2015-05-21 (×5): qty 1

## 2015-05-21 MED ORDER — HYDROMORPHONE HCL 1 MG/ML IJ SOLN
1.0000 mg | Freq: Once | INTRAMUSCULAR | Status: AC
  Administered 2015-05-21: 1 mg via INTRAVENOUS
  Filled 2015-05-21: qty 1

## 2015-05-21 NOTE — Progress Notes (Signed)
Patient ID: [name], male   DOB: 07-18-1976, 39 y.o.   MRN: [id number] Drain management per Interventional Radiology Antibiotic management per Infectious Disease  We will sign off .  No acute surgical indications.  Wilmon ArmsMatthew K. Corliss Skainssuei, MD, [facility]  General/ Trauma Surgery  05/21/2015 11:03 AM

## 2015-05-21 NOTE — Progress Notes (Signed)
Peripherally Inserted Central Catheter/Midline Placement  The IV Nurse has discussed with the patient and/or persons authorized to consent for the patient, the purpose of this procedure and the potential benefits and risks involved with this procedure.  The benefits include less needle sticks, lab draws from the catheter and patient may be discharged home with the catheter.  Risks include, but not limited to, infection, bleeding, blood clot (thrombus formation), and puncture of an artery; nerve damage and irregular heat beat.  Alternatives to this procedure were also discussed.  PICC/Midline Placement Documentation        Vevelyn PatDuncan, Jenet Durio Jean 05/21/2015, 11:50 AM

## 2015-05-21 NOTE — Progress Notes (Signed)
[facility] PROGRESS NOTE  [name] ZOX:[id number] DOB: Mar 29, 1976 DOA: 05/15/2015  PCP: Pcp Not In System  Brief HPI: 39 year old Caucasian male presented with complaints of left-sided back pain and leg pain. Evaluation in the emergency department included a CT scan of the abdomen which revealed an iliopsoas abscess on the left. He was hospitalized for further management.  Past medical history:  Past Medical History  Diagnosis Date  . H/O back injury   . MRSA (methicillin resistant Staphylococcus aureus)     Consultants: Gen. surgery, interventional radiology  Procedures: Percutaneous drain placement. PICC line placement  2-D echocardiogram Study Conclusions - Left ventricle: The cavity size was normal. Wall thickness wasnormal. Systolic function was vigorous. The estimated ejectionfraction was in the range of 65% to 70%. Wall motion was normal;there were no regional wall motion abnormalities. Leftventricular diastolic function parameters were normal. - Aortic valve: Mildly calcified annulus. Trileaflet; mildlythickened leaflets. Valve area (VTI): 2.47 cm^2. Valve area(Vmax): 2.62 cm^2. - Mitral valve: Mildly calcified annulus. Mildly thickened leaflets  Antibiotics: Vancomycin   Subjective: Patient continues to have 9-10 out of 10 pain in the left lower abdomen and the back. Denies any nausea, vomiting. No diarrhea. Last bowel movement was 3 days ago.  Objective: Vital Signs  Filed Vitals:   05/20/15 0339 05/20/15 1252 05/20/15 2001 05/21/15 0408  BP: 118/68 125/75 127/67 124/70  Pulse: 90 90 89 93  Temp: 98 F (36.7 C) 98.5 F (36.9 C) 98.6 F (37 C) 98.3 F (36.8 C)  TempSrc:   Oral   Resp: 17 18 18 17   Height:      Weight:      SpO2: 97% 98% 98% 98%    Intake/Output Summary (Last 24 hours) at 05/21/15 1017 Last data filed at 05/21/15 0857  Gross per 24 hour  Intake    400 ml  Output   2085 ml  Net  -1685 ml   Filed Weights   05/15/15 1340  Weight: 90.719 kg (200 lb)    General appearance: alert, cooperative, appears stated age and no distress Resp: clear to auscultation bilaterally Cardio: regular rate and rhythm, S1, S2 normal, no murmur, click, rub or gallop GI: Soft. Tender in the left lower quadrant of the abdomen. No rebound, rigidity or guarding. No masses or organomegaly. There is a dressing on that side with 2 drain tubes. Neurologic: No focal deficits.  Lab Results:  Basic Metabolic Panel:  Recent Labs Lab 05/15/15 1625 05/17/15 0520 05/18/15 0356 05/21/15 0602  NA 129* 132* 137 134*  K 4.5 4.0 3.9 4.7  CL 92* 98* 106 94*  CO2 27 27 25 31   GLUCOSE 113* 107* 161* 95  BUN 15 8 15 8   CREATININE 0.71 0.61 0.65 0.70  CALCIUM 8.5* 7.7* 7.9* 8.7*   Liver Function Tests:  Recent Labs Lab 05/15/15 1625 05/17/15 0520 05/21/15 0602  AST 69* 60* 27  ALT 174* 120* 69*  ALKPHOS 381* 341* 267*  BILITOT 1.2 0.9 0.4  PROT 7.6 6.2* 7.4  ALBUMIN 2.3* 1.7* 2.0*    Recent Labs Lab 05/15/15 1625  LIPASE 18*   CBC:  Recent Labs Lab 05/15/15 1625 05/17/15 0520 05/18/15 0356 05/21/15 0602  WBC 26.3* 17.6* 13.0* 11.4*  NEUTROABS 22.7*  --   --   --   HGB 11.8* 9.3* 9.6* 9.6*  HCT 35.1* 27.9* 29.7* 30.1*  MCV 82.6 81.3 82.5 83.8  PLT 508* 446* 514* 571*    Recent Results (from the past 240  hour(s))  Culture, blood (routine x 2)     Status: None   Collection Time: 05/15/15  4:00 PM  Result Value Ref Range Status   Specimen Description BLOOD LEFT FOREARM DRAWN BY RN  Final   Special Requests   Final    BOTTLES DRAWN AEROBIC AND ANAEROBIC AEB=6CC ANA=4CC   Culture NO GROWTH 5 DAYS  Final   Report Status 05/20/2015 FINAL  Final  Urine culture     Status: None   Collection Time: 05/15/15  4:13 PM  Result Value Ref Range Status   Specimen Description URINE, CLEAN CATCH  Final   Special Requests NONE  Final   Culture   Final    >=100,000 COLONIES/mL METHICILLIN RESISTANT  STAPHYLOCOCCUS AUREUS Performed at [facility]    Report Status 05/19/2015 FINAL  Final   Organism ID, Bacteria METHICILLIN RESISTANT STAPHYLOCOCCUS AUREUS  Final      Susceptibility   Methicillin resistant staphylococcus aureus - MIC*    CIPROFLOXACIN >=8 RESISTANT Resistant     GENTAMICIN <=0.5 SENSITIVE Sensitive     NITROFURANTOIN <=16 SENSITIVE Sensitive     OXACILLIN >=4 RESISTANT Resistant     TETRACYCLINE <=1 SENSITIVE Sensitive     VANCOMYCIN 1 SENSITIVE Sensitive     TRIMETH/SULFA <=10 SENSITIVE Sensitive     CLINDAMYCIN <=0.25 SENSITIVE Sensitive     RIFAMPIN <=0.5 SENSITIVE Sensitive     Inducible Clindamycin NEGATIVE Sensitive     * >=100,000 COLONIES/mL METHICILLIN RESISTANT STAPHYLOCOCCUS AUREUS  Culture, blood (routine x 2)     Status: None   Collection Time: 05/15/15  4:25 PM  Result Value Ref Range Status   Specimen Description BLOOD RIGHT WRIST  Final   Special Requests BOTTLES DRAWN AEROBIC AND ANAEROBIC 8CC EACH  Final   Culture  Setup Time   Final    GRAM POSITIVE COCCI IN CLUSTERS IN BOTH AEROBIC AND ANAEROBIC BOTTLES CRITICAL RESULT CALLED TO, READ BACK BY AND VERIFIED WITH: HUDSON K. AT [facility] AT 1043A ON [id number] BY THOMPSON S.    Culture   Final    METHICILLIN RESISTANT STAPHYLOCOCCUS AUREUS Performed at [facility]    Report Status 05/18/2015 FINAL  Final   Organism ID, Bacteria METHICILLIN RESISTANT STAPHYLOCOCCUS AUREUS  Final      Susceptibility   Methicillin resistant staphylococcus aureus - MIC*    CIPROFLOXACIN >=8 RESISTANT Resistant     ERYTHROMYCIN >=8 RESISTANT Resistant     GENTAMICIN <=0.5 SENSITIVE Sensitive     OXACILLIN >=4 RESISTANT Resistant     TETRACYCLINE <=1 SENSITIVE Sensitive     VANCOMYCIN 1 SENSITIVE Sensitive     TRIMETH/SULFA <=10 SENSITIVE Sensitive     CLINDAMYCIN <=0.25 SENSITIVE Sensitive     RIFAMPIN <=0.5 SENSITIVE Sensitive     Inducible Clindamycin NEGATIVE Sensitive     * METHICILLIN RESISTANT  STAPHYLOCOCCUS AUREUS  MRSA PCR Screening     Status: Abnormal   Collection Time: 05/16/15  9:08 AM  Result Value Ref Range Status   MRSA by PCR POSITIVE (A) NEGATIVE Final    Comment: RESULT CALLED TO, READ BACK BY AND VERIFIED WITH: C. HUDSON RN 10:50 05/16/15 ([email or web address])        The GeneXpert MRSA Assay (FDA approved for NASAL specimens only), is one component of a comprehensive MRSA colonization surveillance program. It is not intended to diagnose MRSA infection nor to guide or monitor treatment for MRSA infections.   Culture, routine-abscess     Status: None   Collection  Time: 05/16/15  3:47 PM  Result Value Ref Range Status   Specimen Description ABSCESS  Final   Special Requests   Final    CRANIAL ASPECT OF THE LEFT ILIOPSOAS ABSCESS SYRINGE 1   Gram Stain   Final    ABUNDANT WBC PRESENT,BOTH PMN AND MONONUCLEAR NO SQUAMOUS EPITHELIAL CELLS SEEN MODERATE GRAM POSITIVE COCCI IN PAIRS IN CLUSTERS Performed at [facility]    Culture   Final    ABUNDANT METHICILLIN RESISTANT STAPHYLOCOCCUS AUREUS Note: RIFAMPIN AND GENTAMICIN SHOULD NOT BE USED AS SINGLE DRUGS FOR TREATMENT OF STAPH INFECTIONS. This organism is presumed to be Clindamycin resistant based on detection of inducible Clindamycin resistance. CRITICAL RESULT CALLED TO, READ BACK BY AND  VERIFIED WITH: TRANACE D@ 0856 ON [id number] BY [facility] Performed at [facility]    Report Status 05/19/2015 FINAL  Final   Organism ID, Bacteria METHICILLIN RESISTANT STAPHYLOCOCCUS AUREUS  Final      Susceptibility   Methicillin resistant staphylococcus aureus - MIC*    CLINDAMYCIN <=0.25 SENSITIVE Sensitive     ERYTHROMYCIN >=8 RESISTANT Resistant     GENTAMICIN <=0.5 SENSITIVE Sensitive     LEVOFLOXACIN 4 INTERMEDIATE Intermediate     OXACILLIN >=4 RESISTANT Resistant     PENICILLIN >=0.5 RESISTANT Resistant     RIFAMPIN <=0.5 SENSITIVE Sensitive     TRIMETH/SULFA <=10 SENSITIVE Sensitive     VANCOMYCIN 1  SENSITIVE Sensitive     TETRACYCLINE 2 SENSITIVE Sensitive     * ABUNDANT METHICILLIN RESISTANT STAPHYLOCOCCUS AUREUS  Culture, blood (routine x 2)     Status: None (Preliminary result)   Collection Time: 05/18/15 12:30 PM  Result Value Ref Range Status   Specimen Description BLOOD LEFT HAND  Final   Special Requests BOTTLES DRAWN AEROBIC AND ANAEROBIC 5CC  Final   Culture NO GROWTH 2 DAYS  Final   Report Status PENDING  Incomplete  Culture, blood (routine x 2)     Status: None (Preliminary result)   Collection Time: 05/18/15 12:40 PM  Result Value Ref Range Status   Specimen Description BLOOD LEFT HAND  Final   Special Requests BOTTLES DRAWN AEROBIC AND ANAEROBIC 5CC  Final   Culture NO GROWTH 2 DAYS  Final   Report Status PENDING  Incomplete      Studies/Results: No results found.  Medications:  Scheduled: . Chlorhexidine Gluconate Cloth  6 each Topical Q0600  . docusate sodium  100 mg Oral BID  . polyethylene glycol  17 g Oral Daily  . traMADol  100 mg Oral 4 times per day  . vancomycin  1,250 mg Intravenous Q8H   Continuous:  EAV:[id number]:HYDROmorphone (DILAUDID) injection, methocarbamol (ROBAXIN)  IV, ondansetron **OR** ondansetron (ZOFRAN) IV, oxyCODONE, sodium chloride  Assessment/Plan:  Active Problems:   Iliopsoas abscess   Leukocytosis   Hyponatremia   Psoas abscess    Left iliopsoas abscesses.  S/P perc drain, being followed by interventional radiology and general surgery. ID is also following. PICC line has been placed today. He will need intravenous antibiotics for 4 weeks. He had positive blood cultures for MRSA. General surgery plans to repeat a CT scan at the end of this week. HIV non-reactive. 2-D echo negative for vegetations. Can be followed at IR drain clinic. Pain control. Transition to oral narcotics.  MRSA bacteremia Will need at least 4 weeks of IV vancomycin. Infectious disease following.   Elevated alkaline phosphatase, AST and  LFT/thrombocytosis This may be reflection of infection, versus an inflammatory process.  Monitor closely. Seems to be improving  Hyponatremia Stable. IV fluids discontinued  Leukocytosis Improving  Normocytic anemia Stable. No overt bleeding.  Nicotine abuse Nicotine cessation counseling done  DVT Prophylaxis: SCDs    Code Status: Full code  Family Communication: Discussed with the patient and his wife  Disposition Plan: Await clearance from general surgery. CT scan to be repeated before the end of this week. PICC line has been placed today.    LOS: 6 days   [facility]  [facility] Pager [phone] 05/21/2015, 10:17 AM  If 7PM-7AM, please contact night-coverage at [email or web address], password [facility]

## 2015-05-21 NOTE — Progress Notes (Signed)
[facility]  Date of Admission:  05/15/2015  Antibiotics: vancomycin  Subjective: No changes, picc placed  Objective: Temp:  [98.3 F (36.8 C)-98.6 F (37 C)] 98.3 F (36.8 C) (06/29 1204) Pulse Rate:  [79-93] 79 (06/29 1204) Resp:  [17-19] 19 (06/29 1204) BP: (109-127)/(65-70) 109/65 mmHg (06/29 1204) SpO2:  [97 %-98 %] 97 % (06/29 1204)  General: awake, alert, nad Skin: multiple tattoos, no rashes Lungs: CTA B Cor: RRR Abdomen: soft, nt, + drains Ext: no edema  Lab Results Lab Results  Component Value Date   WBC 11.4* 05/21/2015   HGB 9.6* 05/21/2015   HCT 30.1* 05/21/2015   MCV 83.8 05/21/2015   PLT 571* 05/21/2015    Lab Results  Component Value Date   CREATININE 0.70 05/21/2015   BUN 8 05/21/2015   NA 134* 05/21/2015   K 4.7 05/21/2015   CL 94* 05/21/2015   CO2 31 05/21/2015    Lab Results  Component Value Date   ALT 69* 05/21/2015   AST 27 05/21/2015   ALKPHOS 267* 05/21/2015   BILITOT 0.4 05/21/2015      Microbiology: Recent Results (from the past 240 hour(s))  Culture, blood (routine x 2)     Status: None   Collection Time: 05/15/15  4:00 PM  Result Value Ref Range Status   Specimen Description BLOOD LEFT FOREARM DRAWN BY RN  Final   Special Requests   Final    BOTTLES DRAWN AEROBIC AND ANAEROBIC AEB=6CC ANA=4CC   Culture NO GROWTH 5 DAYS  Final   Report Status 05/20/2015 FINAL  Final  Urine culture     Status: None   Collection Time: 05/15/15  4:13 PM  Result Value Ref Range Status   Specimen Description URINE, CLEAN CATCH  Final   Special Requests NONE  Final   Culture   Final    >=100,000 COLONIES/mL METHICILLIN RESISTANT STAPHYLOCOCCUS AUREUS Performed at [facility]    Report Status 05/19/2015 FINAL  Final   Organism ID, Bacteria METHICILLIN RESISTANT STAPHYLOCOCCUS AUREUS  Final      Susceptibility   Methicillin resistant staphylococcus aureus - MIC*    CIPROFLOXACIN >=8 RESISTANT Resistant      GENTAMICIN <=0.5 SENSITIVE Sensitive     NITROFURANTOIN <=16 SENSITIVE Sensitive     OXACILLIN >=4 RESISTANT Resistant     TETRACYCLINE <=1 SENSITIVE Sensitive     VANCOMYCIN 1 SENSITIVE Sensitive     TRIMETH/SULFA <=10 SENSITIVE Sensitive     CLINDAMYCIN <=0.25 SENSITIVE Sensitive     RIFAMPIN <=0.5 SENSITIVE Sensitive     Inducible Clindamycin NEGATIVE Sensitive     * >=100,000 COLONIES/mL METHICILLIN RESISTANT STAPHYLOCOCCUS AUREUS  Culture, blood (routine x 2)     Status: None   Collection Time: 05/15/15  4:25 PM  Result Value Ref Range Status   Specimen Description BLOOD RIGHT WRIST  Final   Special Requests BOTTLES DRAWN AEROBIC AND ANAEROBIC 8CC EACH  Final   Culture  Setup Time   Final    GRAM POSITIVE COCCI IN CLUSTERS IN BOTH AEROBIC AND ANAEROBIC BOTTLES CRITICAL RESULT CALLED TO, READ BACK BY AND VERIFIED WITH: HUDSON K. AT [facility] AT 1043A ON [id number] BY THOMPSON S.    Culture   Final    METHICILLIN RESISTANT STAPHYLOCOCCUS AUREUS Performed at [facility]    Report Status 05/18/2015 FINAL  Final   Organism ID, Bacteria METHICILLIN RESISTANT STAPHYLOCOCCUS AUREUS  Final      Susceptibility  Methicillin resistant staphylococcus aureus - MIC*    CIPROFLOXACIN >=8 RESISTANT Resistant     ERYTHROMYCIN >=8 RESISTANT Resistant     GENTAMICIN <=0.5 SENSITIVE Sensitive     OXACILLIN >=4 RESISTANT Resistant     TETRACYCLINE <=1 SENSITIVE Sensitive     VANCOMYCIN 1 SENSITIVE Sensitive     TRIMETH/SULFA <=10 SENSITIVE Sensitive     CLINDAMYCIN <=0.25 SENSITIVE Sensitive     RIFAMPIN <=0.5 SENSITIVE Sensitive     Inducible Clindamycin NEGATIVE Sensitive     * METHICILLIN RESISTANT STAPHYLOCOCCUS AUREUS  MRSA PCR Screening     Status: Abnormal   Collection Time: 05/16/15  9:08 AM  Result Value Ref Range Status   MRSA by PCR POSITIVE (A) NEGATIVE Final    Comment: RESULT CALLED TO, READ BACK BY AND VERIFIED WITH: C. HUDSON RN 10:50 05/16/15 ([email or web address])        The  GeneXpert MRSA Assay (FDA approved for NASAL specimens only), is one component of a comprehensive MRSA colonization surveillance program. It is not intended to diagnose MRSA infection nor to guide or monitor treatment for MRSA infections.   Culture, routine-abscess     Status: None   Collection Time: 05/16/15  3:47 PM  Result Value Ref Range Status   Specimen Description ABSCESS  Final   Special Requests   Final    CRANIAL ASPECT OF THE LEFT ILIOPSOAS ABSCESS SYRINGE 1   Gram Stain   Final    ABUNDANT WBC PRESENT,BOTH PMN AND MONONUCLEAR NO SQUAMOUS EPITHELIAL CELLS SEEN MODERATE GRAM POSITIVE COCCI IN PAIRS IN CLUSTERS Performed at [facility]    Culture   Final    ABUNDANT METHICILLIN RESISTANT STAPHYLOCOCCUS AUREUS Note: RIFAMPIN AND GENTAMICIN SHOULD NOT BE USED AS SINGLE DRUGS FOR TREATMENT OF STAPH INFECTIONS. This organism is presumed to be Clindamycin resistant based on detection of inducible Clindamycin resistance. CRITICAL RESULT CALLED TO, READ BACK BY AND  VERIFIED WITH: TRANACE D@ 0856 ON [id number] BY [facility] Performed at [facility]    Report Status 05/19/2015 FINAL  Final   Organism ID, Bacteria METHICILLIN RESISTANT STAPHYLOCOCCUS AUREUS  Final      Susceptibility   Methicillin resistant staphylococcus aureus - MIC*    CLINDAMYCIN <=0.25 SENSITIVE Sensitive     ERYTHROMYCIN >=8 RESISTANT Resistant     GENTAMICIN <=0.5 SENSITIVE Sensitive     LEVOFLOXACIN 4 INTERMEDIATE Intermediate     OXACILLIN >=4 RESISTANT Resistant     PENICILLIN >=0.5 RESISTANT Resistant     RIFAMPIN <=0.5 SENSITIVE Sensitive     TRIMETH/SULFA <=10 SENSITIVE Sensitive     VANCOMYCIN 1 SENSITIVE Sensitive     TETRACYCLINE 2 SENSITIVE Sensitive     * ABUNDANT METHICILLIN RESISTANT STAPHYLOCOCCUS AUREUS  Culture, blood (routine x 2)     Status: None (Preliminary result)   Collection Time: 05/18/15 12:30 PM  Result Value Ref Range Status   Specimen Description BLOOD LEFT  HAND  Final   Special Requests BOTTLES DRAWN AEROBIC AND ANAEROBIC 5CC  Final   Culture NO GROWTH 2 DAYS  Final   Report Status PENDING  Incomplete  Culture, blood (routine x 2)     Status: None (Preliminary result)   Collection Time: 05/18/15 12:40 PM  Result Value Ref Range Status   Specimen Description BLOOD LEFT HAND  Final   Special Requests BOTTLES DRAWN AEROBIC AND ANAEROBIC 5CC  Final   Culture NO GROWTH 2 DAYS  Final   Report Status PENDING  Incomplete    Studies/Results:  No results found.  Assessment/Plan:  1) MRSA iliopsoas abscess with bacteremia - repeat blood cultures ngtd.   Will need at least 4 weeks of IV vancomycin.  TTE negative.   Picc done.  Repeat CT later this week per surgery.     Staci Righter, MD [facility] [email or web address] [phone] pager   [phone] cell 05/21/2015, 1:56 PM

## 2015-05-21 NOTE — Progress Notes (Signed)
[facility]  Patient Status:   [facility]  [facility] is providing the following services: HHRN and Home Infusion Pharmacy team for home IV ABX.  Teaching provided with pt and wife who will be primary caregiver. Wife did well with mock IV ABX set up and administration. [facility] until [facility] to support transition home.  If patient discharges after hours, please call [phone].   Sedalia Mutaamela S Chandler 05/21/2015, 11:13 AM

## 2015-05-22 ENCOUNTER — Inpatient Hospital Stay (HOSPITAL_COMMUNITY): Payer: Medicaid Other

## 2015-05-22 DIAGNOSIS — E871 Hypo-osmolality and hyponatremia: Secondary | ICD-10-CM

## 2015-05-22 LAB — VANCOMYCIN, TROUGH: Vancomycin Tr: 22 ug/mL — ABNORMAL HIGH (ref 10.0–20.0)

## 2015-05-22 MED ORDER — FLEET ENEMA 7-19 GM/118ML RE ENEM: 1.0000 | ENEMA | Freq: Every day | RECTAL | Status: DC | PRN

## 2015-05-22 MED ORDER — VANCOMYCIN HCL 10 G IV SOLR
1500.0000 mg | Freq: Two times a day (BID) | INTRAVENOUS | Status: DC
  Administered 2015-05-23 (×2): 1500 mg via INTRAVENOUS
  Filled 2015-05-22 (×3): qty 1500

## 2015-05-22 MED ORDER — POLYETHYLENE GLYCOL 3350 17 G PO PACK
17.0000 g | PACK | Freq: Two times a day (BID) | ORAL | Status: DC
  Administered 2015-05-23: 17 g via ORAL
  Filled 2015-05-22 (×2): qty 1

## 2015-05-22 MED ORDER — IOHEXOL 300 MG/ML  SOLN
25.0000 mL | INTRAMUSCULAR | Status: AC
  Administered 2015-05-22 (×2): 25 mL via ORAL

## 2015-05-22 MED ORDER — SENNA 8.6 MG PO TABS
1.0000 | ORAL_TABLET | Freq: Every day | ORAL | Status: DC
  Administered 2015-05-22: 8.6 mg via ORAL
  Filled 2015-05-22: qty 1

## 2015-05-22 MED ORDER — IOHEXOL 300 MG/ML  SOLN
100.0000 mL | Freq: Once | INTRAMUSCULAR | Status: AC | PRN
  Administered 2015-05-22: 100 mL via INTRAVENOUS

## 2015-05-22 NOTE — Care Management (Signed)
Utilization review completed. Selestino Nila, RN Case Manager [phone]. 

## 2015-05-22 NOTE — Progress Notes (Signed)
[facility] PROGRESS NOTE  [name] ZOX:[id number] DOB: 06-09-1976 DOA: 05/15/2015  PCP: Pcp Not In System  Brief HPI: 39 year old Caucasian male presented with complaints of left-sided back pain and leg pain. Evaluation in the emergency department included a CT scan of the abdomen which revealed an iliopsoas abscess on the left. He was hospitalized for further management.  Past medical history:  Past Medical History  Diagnosis Date  . H/O back injury   . MRSA (methicillin resistant Staphylococcus aureus)     Consultants: Gen. surgery, interventional radiology  Procedures: Percutaneous drain placement. PICC line placement  2-D echocardiogram Study Conclusions - Left ventricle: The cavity size was normal. Wall thickness wasnormal. Systolic function was vigorous. The estimated ejectionfraction was in the range of 65% to 70%. Wall motion was normal;there were no regional wall motion abnormalities. Leftventricular diastolic function parameters were normal. - Aortic valve: Mildly calcified annulus. Trileaflet; mildlythickened leaflets. Valve area (VTI): 2.47 cm^2. Valve area(Vmax): 2.62 cm^2. - Mitral valve: Mildly calcified annulus. Mildly thickened leaflets  Antibiotics: Vancomycin   Subjective: Patient still complains of significant pain in the left lower quadrant of his abdomen. Denies any nausea, vomiting or diarrhea. Actually states that he is constipated. His last bowel movement was 4 days ago.   Objective: Vital Signs  Filed Vitals:   05/20/15 2001 05/21/15 0408 05/21/15 1204 05/21/15 2158  BP: 127/67 124/70 109/65 121/65  Pulse: 89 93 79 99  Temp:  98.3 F (36.8 C) 98.3 F (36.8 C) 98.6 F (37 C)  TempSrc: Oral  Oral Oral  Resp: 18 17 19 19   Height:      Weight:      SpO2: 98% 98% 97% 98%    Intake/Output Summary (Last 24 hours) at 05/22/15 1001 Last data filed at 05/22/15 0108  Gross per 24 hour  Intake   1350 ml  Output     50 ml  Net    1300 ml   Filed Weights   05/15/15 1340  Weight: 90.719 kg (200 lb)    General appearance: alert, cooperative, appears stated age and no distress Resp: clear to auscultation bilaterally Cardio: regular rate and rhythm, S1, S2 normal, no murmur, click, rub or gallop GI: Soft. Remains tender in the left lower quadrant of the abdomen. No rebound, rigidity or guarding. No masses or organomegaly. There is a dressing on that side with 2 drain tubes. Neurologic: No focal deficits.  Lab Results:  Basic Metabolic Panel:  Recent Labs Lab 05/15/15 1625 05/17/15 0520 05/18/15 0356 05/21/15 0602  NA 129* 132* 137 134*  K 4.5 4.0 3.9 4.7  CL 92* 98* 106 94*  CO2 27 27 25 31   GLUCOSE 113* 107* 161* 95  BUN 15 8 15 8   CREATININE 0.71 0.61 0.65 0.70  CALCIUM 8.5* 7.7* 7.9* 8.7*   Liver Function Tests:  Recent Labs Lab 05/15/15 1625 05/17/15 0520 05/21/15 0602  AST 69* 60* 27  ALT 174* 120* 69*  ALKPHOS 381* 341* 267*  BILITOT 1.2 0.9 0.4  PROT 7.6 6.2* 7.4  ALBUMIN 2.3* 1.7* 2.0*    Recent Labs Lab 05/15/15 1625  LIPASE 18*   CBC:  Recent Labs Lab 05/15/15 1625 05/17/15 0520 05/18/15 0356 05/21/15 0602  WBC 26.3* 17.6* 13.0* 11.4*  NEUTROABS 22.7*  --   --   --   HGB 11.8* 9.3* 9.6* 9.6*  HCT 35.1* 27.9* 29.7* 30.1*  MCV 82.6 81.3 82.5 83.8  PLT 508* 446* 514* 571*  Recent Results (from the past 240 hour(s))  Culture, blood (routine x 2)     Status: None   Collection Time: 05/15/15  4:00 PM  Result Value Ref Range Status   Specimen Description BLOOD LEFT FOREARM DRAWN BY RN  Final   Special Requests   Final    BOTTLES DRAWN AEROBIC AND ANAEROBIC AEB=6CC ANA=4CC   Culture NO GROWTH 5 DAYS  Final   Report Status 05/20/2015 FINAL  Final  Urine culture     Status: None   Collection Time: 05/15/15  4:13 PM  Result Value Ref Range Status   Specimen Description URINE, CLEAN CATCH  Final   Special Requests NONE  Final   Culture   Final    >=100,000  COLONIES/mL METHICILLIN RESISTANT STAPHYLOCOCCUS AUREUS Performed at [facility]    Report Status 05/19/2015 FINAL  Final   Organism ID, Bacteria METHICILLIN RESISTANT STAPHYLOCOCCUS AUREUS  Final      Susceptibility   Methicillin resistant staphylococcus aureus - MIC*    CIPROFLOXACIN >=8 RESISTANT Resistant     GENTAMICIN <=0.5 SENSITIVE Sensitive     NITROFURANTOIN <=16 SENSITIVE Sensitive     OXACILLIN >=4 RESISTANT Resistant     TETRACYCLINE <=1 SENSITIVE Sensitive     VANCOMYCIN 1 SENSITIVE Sensitive     TRIMETH/SULFA <=10 SENSITIVE Sensitive     CLINDAMYCIN <=0.25 SENSITIVE Sensitive     RIFAMPIN <=0.5 SENSITIVE Sensitive     Inducible Clindamycin NEGATIVE Sensitive     * >=100,000 COLONIES/mL METHICILLIN RESISTANT STAPHYLOCOCCUS AUREUS  Culture, blood (routine x 2)     Status: None   Collection Time: 05/15/15  4:25 PM  Result Value Ref Range Status   Specimen Description BLOOD RIGHT WRIST  Final   Special Requests BOTTLES DRAWN AEROBIC AND ANAEROBIC 8CC EACH  Final   Culture  Setup Time   Final    GRAM POSITIVE COCCI IN CLUSTERS IN BOTH AEROBIC AND ANAEROBIC BOTTLES CRITICAL RESULT CALLED TO, READ BACK BY AND VERIFIED WITH: HUDSON K. AT [facility] AT 1043A ON [id number] BY THOMPSON S.    Culture   Final    METHICILLIN RESISTANT STAPHYLOCOCCUS AUREUS Performed at [facility]    Report Status 05/18/2015 FINAL  Final   Organism ID, Bacteria METHICILLIN RESISTANT STAPHYLOCOCCUS AUREUS  Final      Susceptibility   Methicillin resistant staphylococcus aureus - MIC*    CIPROFLOXACIN >=8 RESISTANT Resistant     ERYTHROMYCIN >=8 RESISTANT Resistant     GENTAMICIN <=0.5 SENSITIVE Sensitive     OXACILLIN >=4 RESISTANT Resistant     TETRACYCLINE <=1 SENSITIVE Sensitive     VANCOMYCIN 1 SENSITIVE Sensitive     TRIMETH/SULFA <=10 SENSITIVE Sensitive     CLINDAMYCIN <=0.25 SENSITIVE Sensitive     RIFAMPIN <=0.5 SENSITIVE Sensitive     Inducible Clindamycin NEGATIVE Sensitive      * METHICILLIN RESISTANT STAPHYLOCOCCUS AUREUS  MRSA PCR Screening     Status: Abnormal   Collection Time: 05/16/15  9:08 AM  Result Value Ref Range Status   MRSA by PCR POSITIVE (A) NEGATIVE Final    Comment: RESULT CALLED TO, READ BACK BY AND VERIFIED WITH: C. HUDSON RN 10:50 05/16/15 ([email or web address])        The GeneXpert MRSA Assay (FDA approved for NASAL specimens only), is one component of a comprehensive MRSA colonization surveillance program. It is not intended to diagnose MRSA infection nor to guide or monitor treatment for MRSA infections.   Culture, routine-abscess  Status: None   Collection Time: 05/16/15  3:47 PM  Result Value Ref Range Status   Specimen Description ABSCESS  Final   Special Requests   Final    CRANIAL ASPECT OF THE LEFT ILIOPSOAS ABSCESS SYRINGE 1   Gram Stain   Final    ABUNDANT WBC PRESENT,BOTH PMN AND MONONUCLEAR NO SQUAMOUS EPITHELIAL CELLS SEEN MODERATE GRAM POSITIVE COCCI IN PAIRS IN CLUSTERS Performed at [facility]    Culture   Final    ABUNDANT METHICILLIN RESISTANT STAPHYLOCOCCUS AUREUS Note: RIFAMPIN AND GENTAMICIN SHOULD NOT BE USED AS SINGLE DRUGS FOR TREATMENT OF STAPH INFECTIONS. This organism is presumed to be Clindamycin resistant based on detection of inducible Clindamycin resistance. CRITICAL RESULT CALLED TO, READ BACK BY AND  VERIFIED WITH: TRANACE D@ 0856 ON [id number] BY [facility] Performed at [facility]    Report Status 05/19/2015 FINAL  Final   Organism ID, Bacteria METHICILLIN RESISTANT STAPHYLOCOCCUS AUREUS  Final      Susceptibility   Methicillin resistant staphylococcus aureus - MIC*    CLINDAMYCIN <=0.25 SENSITIVE Sensitive     ERYTHROMYCIN >=8 RESISTANT Resistant     GENTAMICIN <=0.5 SENSITIVE Sensitive     LEVOFLOXACIN 4 INTERMEDIATE Intermediate     OXACILLIN >=4 RESISTANT Resistant     PENICILLIN >=0.5 RESISTANT Resistant     RIFAMPIN <=0.5 SENSITIVE Sensitive     TRIMETH/SULFA <=10 SENSITIVE  Sensitive     VANCOMYCIN 1 SENSITIVE Sensitive     TETRACYCLINE 2 SENSITIVE Sensitive     * ABUNDANT METHICILLIN RESISTANT STAPHYLOCOCCUS AUREUS  Culture, blood (routine x 2)     Status: None (Preliminary result)   Collection Time: 05/18/15 12:30 PM  Result Value Ref Range Status   Specimen Description BLOOD LEFT HAND  Final   Special Requests BOTTLES DRAWN AEROBIC AND ANAEROBIC 5CC  Final   Culture NO GROWTH 3 DAYS  Final   Report Status PENDING  Incomplete  Culture, blood (routine x 2)     Status: None (Preliminary result)   Collection Time: 05/18/15 12:40 PM  Result Value Ref Range Status   Specimen Description BLOOD LEFT HAND  Final   Special Requests BOTTLES DRAWN AEROBIC AND ANAEROBIC 5CC  Final   Culture NO GROWTH 3 DAYS  Final   Report Status PENDING  Incomplete      Studies/Results: No results found.  Medications:  Scheduled: . docusate sodium  100 mg Oral BID  . polyethylene glycol  17 g Oral Daily  . traMADol  100 mg Oral 4 times per day  . vancomycin  1,250 mg Intravenous Q8H   Continuous:  EAV:[id number]:HYDROmorphone (DILAUDID) injection, methocarbamol (ROBAXIN)  IV, ondansetron **OR** ondansetron (ZOFRAN) IV, oxyCODONE, sodium chloride  Assessment/Plan:  Active Problems:   Iliopsoas abscess   Leukocytosis   Hyponatremia   Psoas abscess    Left iliopsoas abscesses.  S/P perc drain, being followed by interventional radiology and general surgery. ID is also following. PICC line has been placed. He will need intravenous antibiotics for 4 weeks. He had positive blood cultures for MRSA. General surgery recommended repeating a CT scan. An order for today. HIV non-reactive. 2-D echo negative for vegetations. Can be followed at IR drain clinic. Pain control. Explained to the patient that he needs to be transitioned to oral narcotics.   MRSA bacteremia Will need at least 4 weeks of IV vancomycin. Infectious disease following.   Elevated alkaline phosphatase, AST and  LFT/thrombocytosis This may be reflection of inflammatory reaction. Monitor closely.  Seems to be improving  Hyponatremia Stable. IV fluids discontinued.  Leukocytosis Improving  Normocytic anemia Stable. No overt bleeding.  Nicotine abuse Nicotine cessation counseling done  DVT Prophylaxis: SCDs    Code Status: Full code  Family Communication: Discussed with the patient and his wife  Disposition Plan: CT scan to be repeated. PICC line has been placed. Continue to mobilize.    LOS: 7 days   [facility]  [facility] Pager [phone] 05/22/2015, 10:01 AM  If 7PM-7AM, please contact night-coverage at [email or web address], password [facility]

## 2015-05-22 NOTE — Progress Notes (Signed)
[facility]  Date of Admission:  05/15/2015  Antibiotics: vancomycin  Subjective: No changes, still with pain  Objective: Temp:  [98.6 F (37 C)] 98.6 F (37 C) (06/29 2158) Pulse Rate:  [99] 99 (06/29 2158) Resp:  [19] 19 (06/29 2158) BP: (121)/(65) 121/65 mmHg (06/29 2158) SpO2:  [98 %] 98 % (06/29 2158)  General: awake, alert, nad Skin: multiple tattoos, no rashes Lungs: CTA B Cor: RRR Abdomen: soft, nt, + drains Ext: no edema  Lab Results Lab Results  Component Value Date   WBC 11.4* 05/21/2015   HGB 9.6* 05/21/2015   HCT 30.1* 05/21/2015   MCV 83.8 05/21/2015   PLT 571* 05/21/2015    Lab Results  Component Value Date   CREATININE 0.70 05/21/2015   BUN 8 05/21/2015   NA 134* 05/21/2015   K 4.7 05/21/2015   CL 94* 05/21/2015   CO2 31 05/21/2015    Lab Results  Component Value Date   ALT 69* 05/21/2015   AST 27 05/21/2015   ALKPHOS 267* 05/21/2015   BILITOT 0.4 05/21/2015      Microbiology: Recent Results (from the past 240 hour(s))  Culture, blood (routine x 2)     Status: None   Collection Time: 05/15/15  4:00 PM  Result Value Ref Range Status   Specimen Description BLOOD LEFT FOREARM DRAWN BY RN  Final   Special Requests   Final    BOTTLES DRAWN AEROBIC AND ANAEROBIC AEB=6CC ANA=4CC   Culture NO GROWTH 5 DAYS  Final   Report Status 05/20/2015 FINAL  Final  Urine culture     Status: None   Collection Time: 05/15/15  4:13 PM  Result Value Ref Range Status   Specimen Description URINE, CLEAN CATCH  Final   Special Requests NONE  Final   Culture   Final    >=100,000 COLONIES/mL METHICILLIN RESISTANT STAPHYLOCOCCUS AUREUS Performed at [facility]    Report Status 05/19/2015 FINAL  Final   Organism ID, Bacteria METHICILLIN RESISTANT STAPHYLOCOCCUS AUREUS  Final      Susceptibility   Methicillin resistant staphylococcus aureus - MIC*    CIPROFLOXACIN >=8 RESISTANT Resistant     GENTAMICIN <=0.5 SENSITIVE  Sensitive     NITROFURANTOIN <=16 SENSITIVE Sensitive     OXACILLIN >=4 RESISTANT Resistant     TETRACYCLINE <=1 SENSITIVE Sensitive     VANCOMYCIN 1 SENSITIVE Sensitive     TRIMETH/SULFA <=10 SENSITIVE Sensitive     CLINDAMYCIN <=0.25 SENSITIVE Sensitive     RIFAMPIN <=0.5 SENSITIVE Sensitive     Inducible Clindamycin NEGATIVE Sensitive     * >=100,000 COLONIES/mL METHICILLIN RESISTANT STAPHYLOCOCCUS AUREUS  Culture, blood (routine x 2)     Status: None   Collection Time: 05/15/15  4:25 PM  Result Value Ref Range Status   Specimen Description BLOOD RIGHT WRIST  Final   Special Requests BOTTLES DRAWN AEROBIC AND ANAEROBIC 8CC EACH  Final   Culture  Setup Time   Final    GRAM POSITIVE COCCI IN CLUSTERS IN BOTH AEROBIC AND ANAEROBIC BOTTLES CRITICAL RESULT CALLED TO, READ BACK BY AND VERIFIED WITH: HUDSON K. AT [facility] AT 1043A ON [id number] BY THOMPSON S.    Culture   Final    METHICILLIN RESISTANT STAPHYLOCOCCUS AUREUS Performed at [facility]    Report Status 05/18/2015 FINAL  Final   Organism ID, Bacteria METHICILLIN RESISTANT STAPHYLOCOCCUS AUREUS  Final      Susceptibility   Methicillin resistant staphylococcus  aureus - MIC*    CIPROFLOXACIN >=8 RESISTANT Resistant     ERYTHROMYCIN >=8 RESISTANT Resistant     GENTAMICIN <=0.5 SENSITIVE Sensitive     OXACILLIN >=4 RESISTANT Resistant     TETRACYCLINE <=1 SENSITIVE Sensitive     VANCOMYCIN 1 SENSITIVE Sensitive     TRIMETH/SULFA <=10 SENSITIVE Sensitive     CLINDAMYCIN <=0.25 SENSITIVE Sensitive     RIFAMPIN <=0.5 SENSITIVE Sensitive     Inducible Clindamycin NEGATIVE Sensitive     * METHICILLIN RESISTANT STAPHYLOCOCCUS AUREUS  MRSA PCR Screening     Status: Abnormal   Collection Time: 05/16/15  9:08 AM  Result Value Ref Range Status   MRSA by PCR POSITIVE (A) NEGATIVE Final    Comment: RESULT CALLED TO, READ BACK BY AND VERIFIED WITH: C. HUDSON RN 10:50 05/16/15 ([email or web address])        The GeneXpert MRSA Assay (FDA approved  for NASAL specimens only), is one component of a comprehensive MRSA colonization surveillance program. It is not intended to diagnose MRSA infection nor to guide or monitor treatment for MRSA infections.   Culture, routine-abscess     Status: None   Collection Time: 05/16/15  3:47 PM  Result Value Ref Range Status   Specimen Description ABSCESS  Final   Special Requests   Final    CRANIAL ASPECT OF THE LEFT ILIOPSOAS ABSCESS SYRINGE 1   Gram Stain   Final    ABUNDANT WBC PRESENT,BOTH PMN AND MONONUCLEAR NO SQUAMOUS EPITHELIAL CELLS SEEN MODERATE GRAM POSITIVE COCCI IN PAIRS IN CLUSTERS Performed at [facility]    Culture   Final    ABUNDANT METHICILLIN RESISTANT STAPHYLOCOCCUS AUREUS Note: RIFAMPIN AND GENTAMICIN SHOULD NOT BE USED AS SINGLE DRUGS FOR TREATMENT OF STAPH INFECTIONS. This organism is presumed to be Clindamycin resistant based on detection of inducible Clindamycin resistance. CRITICAL RESULT CALLED TO, READ BACK BY AND  VERIFIED WITH: TRANACE D@ 0856 ON [id number] BY [facility] Performed at [facility]    Report Status 05/19/2015 FINAL  Final   Organism ID, Bacteria METHICILLIN RESISTANT STAPHYLOCOCCUS AUREUS  Final      Susceptibility   Methicillin resistant staphylococcus aureus - MIC*    CLINDAMYCIN <=0.25 SENSITIVE Sensitive     ERYTHROMYCIN >=8 RESISTANT Resistant     GENTAMICIN <=0.5 SENSITIVE Sensitive     LEVOFLOXACIN 4 INTERMEDIATE Intermediate     OXACILLIN >=4 RESISTANT Resistant     PENICILLIN >=0.5 RESISTANT Resistant     RIFAMPIN <=0.5 SENSITIVE Sensitive     TRIMETH/SULFA <=10 SENSITIVE Sensitive     VANCOMYCIN 1 SENSITIVE Sensitive     TETRACYCLINE 2 SENSITIVE Sensitive     * ABUNDANT METHICILLIN RESISTANT STAPHYLOCOCCUS AUREUS  Culture, blood (routine x 2)     Status: None (Preliminary result)   Collection Time: 05/18/15 12:30 PM  Result Value Ref Range Status   Specimen Description BLOOD LEFT HAND  Final   Special Requests  BOTTLES DRAWN AEROBIC AND ANAEROBIC 5CC  Final   Culture NO GROWTH 3 DAYS  Final   Report Status PENDING  Incomplete  Culture, blood (routine x 2)     Status: None (Preliminary result)   Collection Time: 05/18/15 12:40 PM  Result Value Ref Range Status   Specimen Description BLOOD LEFT HAND  Final   Special Requests BOTTLES DRAWN AEROBIC AND ANAEROBIC 5CC  Final   Culture NO GROWTH 3 DAYS  Final   Report Status PENDING  Incomplete    Studies/Results: No results found.  Assessment/Plan:  1) MRSA iliopsoas abscess with bacteremia - repeat blood cultures ngtd.   Will need at least 4 weeks of IV vancomycin.  TTE negative.   Picc done.  Repeat CT today. Dr. Orvan Falconer to follow up tomorrow   Staci Righter, MD [facility] [email or web address] [phone] pager   [phone] cell 05/22/2015, 2:36 PM

## 2015-05-22 NOTE — Progress Notes (Signed)
ANTIBIOTIC CONSULT NOTE   Pharmacy Consult for Vancomycin Indication:  Intra abdominal infection, abscess  Allergies  Allergen Reactions  . Vicodin [Hydrocodone-Acetaminophen] Hives and Itching  . Adhesive [Tape]     Patient Measurements: Height: 6' (182.9 cm) Weight: 200 lb (90.719 kg) IBW/kg (Calculated) : 77.6 Adjusted Body Weight:   Vital Signs:   Intake/Output from previous day: 06/29 0701 - 06/30 0700 In: 1750 [P.O.:1220; IV Piggyback:500] Out: 50 [Drains:50] Intake/Output from this shift:    Labs:  Recent Labs  05/21/15 0602  WBC 11.4*  HGB 9.6*  PLT 571*  CREATININE 0.70   Estimated Creatinine Clearance: 140.1 mL/min (by C-G formula based on Cr of 0.7).  Recent Labs  05/22/15 0940  VANCOTROUGH 22*    Assessment: 38 YOM on vancomycin D#8 for Left iliopsoas abscesses and MRSA bacteremia, ID following, denies IVDU, HIV neg, 2D echo neg for vege, PICC line placed, plan for IV vancomycin x 4 weeks. WBC dec to 11.4  Afeb. Vancomycin trough = 22 slightly supratherapeutic. Renal function is good with est. Crcl > 100 ml/min. Urine x 5.  RN already give a dose after vanc level was drawn.   Vancomycin 6/23> Zosyn 6/23 > 6/24 Percutaneous drain placed  6/23 BC x 2 --> MRSA MRSA PCR positive 6/23  Urine > MRSA 6/24  Abscess>> MRSA 6/26 Blood x 2 -ngtd   Goal of Therapy:  Vancomycin trough 15-20  Plan: Change Vancomycin to 1500 gram iv Q 12 hours, next dose tomorrow at 0400 Recheck vancomycin trough at new steady state  Thanks for allowing pharmacy to be a part of this patient's care.  Bayard HuggerMei Markcus Lazenby, PharmD, BCPS  Clinical Pharmacist  Pager: [phone]   05/22/2015,12:24 PM

## 2015-05-22 NOTE — Progress Notes (Signed)
Patient ID: [name], male   DOB: 11/04/1976, 39 y.o.   MRN: [id number]     Referring Physician(s): [facility]  Subjective:  [name] is doing ok.  He has no new complaints.  He is s/p LLQ drains x 2 on 6/24  Allergies: Vicodin and Adhesive  Medications: Prior to Admission medications   Medication Sig Start Date End Date Taking? Authorizing Provider  diphenhydramine-acetaminophen (TYLENOL PM) 25-500 MG TABS Take 1 tablet by mouth at bedtime as needed.   Yes Historical Provider, MD  cyclobenzaprine (FLEXERIL) 5 MG tablet Take 1 tablet (5 mg total) by mouth 3 (three) times daily as needed for muscle spasms. Patient not taking: Reported on 05/15/2015 05/04/15   Myra RudeJeremy E Schmitz, MD  meloxicam (MOBIC) 7.5 MG tablet Take 1 tablet (7.5 mg total) by mouth daily. Patient not taking: Reported on 05/15/2015 05/04/15   Myra RudeJeremy E Schmitz, MD  predniSONE (DELTASONE) 10 MG tablet Take 2 tablets (20 mg total) by mouth 2 (two) times daily. Patient not taking: Reported on 05/15/2015 05/05/15   Geoffery Lyonsouglas Delo, MD     Vital Signs: BP 121/65 mmHg  Pulse 99  Temp(Src) 98.6 F (37 C) (Oral)  Resp 19  Ht 6' (1.829 m)  Wt 200 lb (90.719 kg)  BMI 27.12 kg/m2  SpO2 98%  Physical Exam   Awake and Alert  He is less tender today.  Drains in place, both flush well.  Skin at insertion site looks good, no erythema.  About 50 mls output recorded.  Imaging: No results found.  Labs:  CBC:  Recent Labs  05/15/15 1625 05/17/15 0520 05/18/15 0356 05/21/15 0602  WBC 26.3* 17.6* 13.0* 11.4*  HGB 11.8* 9.3* 9.6* 9.6*  HCT 35.1* 27.9* 29.7* 30.1*  PLT 508* 446* 514* 571*    COAGS:  Recent Labs  05/15/15 1625  INR 1.33    BMP:  Recent Labs  05/15/15 1625 05/17/15 0520 05/18/15 0356 05/21/15 0602  NA 129* 132* 137 134*  K 4.5 4.0 3.9 4.7  CL 92* 98* 106 94*  CO2 27 27 25 31   GLUCOSE 113* 107* 161* 95  BUN 15 8 15 8   CALCIUM 8.5* 7.7* 7.9* 8.7*  CREATININE 0.71 0.61 0.65  0.70  GFRNONAA >60 >60 >60 >60  GFRAA >60 >60 >60 >60    LIVER FUNCTION TESTS:  Recent Labs  05/15/15 1625 05/17/15 0520 05/21/15 0602  BILITOT 1.2 0.9 0.4  AST 69* 60* 27  ALT 174* 120* 69*  ALKPHOS 381* 341* 267*  PROT 7.6 6.2* 7.4  ALBUMIN 2.3* 1.7* 2.0*    Assessment and Plan:  Continue current care. For CT scan this week per CCS Will continue to follow.  Signed: Gwynneth MacleodWENDY S Henli Hey PA-C 05/22/2015, 9:56 AM   I spent a total of 15 Minutes in face to face in clinical consultation/evaluation, greater than 50% of which was counseling/coordinating care for LLQ drains x 2

## 2015-05-23 DIAGNOSIS — R7881 Bacteremia: Secondary | ICD-10-CM | POA: Insufficient documentation

## 2015-05-23 LAB — CULTURE, BLOOD (ROUTINE X 2)
CULTURE: NO GROWTH
Culture: NO GROWTH

## 2015-05-23 LAB — CBC
HCT: 32.1 % — ABNORMAL LOW (ref 39.0–52.0)
Hemoglobin: 10.3 g/dL — ABNORMAL LOW (ref 13.0–17.0)
MCH: 26.8 pg (ref 26.0–34.0)
MCHC: 32.1 g/dL (ref 30.0–36.0)
MCV: 83.6 fL (ref 78.0–100.0)
Platelets: 607 10*3/uL — ABNORMAL HIGH (ref 150–400)
RBC: 3.84 MIL/uL — ABNORMAL LOW (ref 4.22–5.81)
RDW: 14.1 % (ref 11.5–15.5)
WBC: 10.9 10*3/uL — ABNORMAL HIGH (ref 4.0–10.5)

## 2015-05-23 LAB — BASIC METABOLIC PANEL
ANION GAP: 8 (ref 5–15)
BUN: 16 mg/dL (ref 6–20)
CO2: 31 mmol/L (ref 22–32)
CREATININE: 0.78 mg/dL (ref 0.61–1.24)
Calcium: 9 mg/dL (ref 8.9–10.3)
Chloride: 96 mmol/L — ABNORMAL LOW (ref 101–111)
GLUCOSE: 106 mg/dL — AB (ref 65–99)
Potassium: 4.5 mmol/L (ref 3.5–5.1)
Sodium: 135 mmol/L (ref 135–145)

## 2015-05-23 MED ORDER — OXYCODONE HCL 10 MG PO TABS: 10.0000 mg | ORAL_TABLET | ORAL | Status: DC | PRN

## 2015-05-23 MED ORDER — VANCOMYCIN HCL 10 G IV SOLR: 1500.0000 mg | Freq: Two times a day (BID) | INTRAVENOUS | Status: DC

## 2015-05-23 MED ORDER — TRAMADOL HCL 50 MG PO TABS: 100.0000 mg | ORAL_TABLET | Freq: Four times a day (QID) | ORAL | Status: DC

## 2015-05-23 MED ORDER — HEPARIN SOD (PORK) LOCK FLUSH 100 UNIT/ML IV SOLN
250.0000 [IU] | INTRAVENOUS | Status: DC | PRN
  Administered 2015-05-23: 250 [IU]
  Filled 2015-05-23 (×2): qty 3

## 2015-05-23 MED ORDER — HEPARIN SOD (PORK) LOCK FLUSH 100 UNIT/ML IV SOLN
250.0000 [IU] | Freq: Every day | INTRAVENOUS | Status: DC
  Filled 2015-05-23: qty 3

## 2015-05-23 MED ORDER — SENNA 8.6 MG PO TABS: 1.0000 | ORAL_TABLET | Freq: Every day | ORAL | Status: DC

## 2015-05-23 MED ORDER — POLYETHYLENE GLYCOL 3350 17 G PO PACK: 17.0000 g | PACK | Freq: Two times a day (BID) | ORAL | Status: DC

## 2015-05-23 MED ORDER — DOCUSATE SODIUM 100 MG PO CAPS
100.0000 mg | ORAL_CAPSULE | Freq: Two times a day (BID) | ORAL | Status: DC
Start: 2015-05-23 — End: 2015-06-19

## 2015-05-23 MED ORDER — METHOCARBAMOL 500 MG PO TABS: 500.0000 mg | ORAL_TABLET | Freq: Three times a day (TID) | ORAL | Status: DC | PRN

## 2015-05-23 NOTE — Progress Notes (Signed)
Referring Physician(s): [facility]  Subjective:  Ileopsoas abscess drians placed 6/24 Doing well CT 6/30 shows great improvement Not gone Still with output of 30 cc/day and 60 cc/day Pt does feel better, yet still discomfort  Allergies: Vicodin and Adhesive  Medications: Prior to Admission medications   Medication Sig Start Date End Date Taking? Authorizing Provider  diphenhydramine-acetaminophen (TYLENOL PM) 25-500 MG TABS Take 1 tablet by mouth at bedtime as needed.   Yes Historical Provider, MD  cyclobenzaprine (FLEXERIL) 5 MG tablet Take 1 tablet (5 mg total) by mouth 3 (three) times daily as needed for muscle spasms. Patient not taking: Reported on 05/15/2015 05/04/15   Myra RudeJeremy E Schmitz, MD  docusate sodium (COLACE) 100 MG capsule Take 1 capsule (100 mg total) by mouth 2 (two) times daily. 05/23/15   Osvaldo ShipperGokul Krishnan, MD  meloxicam (MOBIC) 7.5 MG tablet Take 1 tablet (7.5 mg total) by mouth daily. Patient not taking: Reported on 05/15/2015 05/04/15   Myra RudeJeremy E Schmitz, MD  methocarbamol (ROBAXIN) 500 MG tablet Take 1 tablet (500 mg total) by mouth every 8 (eight) hours as needed for muscle spasms. 05/23/15   Osvaldo ShipperGokul Krishnan, MD  oxyCODONE 10 MG TABS Take 1-2 tablets (10-20 mg total) by mouth every 4 (four) hours as needed (10mg  for moderate pain, 20mg  for severe pain). 05/23/15   Osvaldo ShipperGokul Krishnan, MD  polyethylene glycol (MIRALAX / GLYCOLAX) packet Take 17 g by mouth 2 (two) times daily. May change to as needed once with regular bowel movements. 05/23/15   Osvaldo ShipperGokul Krishnan, MD  predniSONE (DELTASONE) 10 MG tablet Take 2 tablets (20 mg total) by mouth 2 (two) times daily. Patient not taking: Reported on 05/15/2015 05/05/15   Geoffery Lyonsouglas Delo, MD  senna (SENOKOT) 8.6 MG TABS tablet Take 1 tablet (8.6 mg total) by mouth at bedtime. 05/23/15   Osvaldo ShipperGokul Krishnan, MD  traMADol (ULTRAM) 50 MG tablet Take 2 tablets (100 mg total) by mouth every 6 (six) hours. 05/23/15   Osvaldo ShipperGokul Krishnan, MD  vancomycin 1,500 mg in  sodium chloride 0.9 % 500 mL Inject 1,500 mg into the vein every 12 (twelve) hours. For 3 more weeks 05/23/15   Osvaldo ShipperGokul Krishnan, MD     Vital Signs: BP 119/62 mmHg  Pulse 87  Temp(Src) 97.7 F (36.5 C) (Oral)  Resp 18  Ht 6' (1.829 m)  Wt 200 lb (90.719 kg)  BMI 27.12 kg/m2  SpO2 98%  Physical Exam  Constitutional: He is oriented to person, place, and time.  Abdominal: Soft. Bowel sounds are normal. There is tenderness.  Skin sites of drain clean and dry Output 90 cc total 30 cc and 60 cc yesterday Bloody fluid +staph Both drains Flush well  afeb Wbc wnl CT shows much improved---not resolved  Neurological: He is alert and oriented to person, place, and time.  Skin: Skin is warm.  Psychiatric: He has a normal mood and affect. His behavior is normal. Judgment and thought content normal.  Nursing note and vitals reviewed.   Imaging: Ct Abdomen Pelvis W Contrast  05/22/2015   CLINICAL DATA:  39 year old male with left iliopsoas abscess.  EXAM: CT ABDOMEN AND PELVIS WITH CONTRAST  TECHNIQUE: Multidetector CT imaging of the abdomen and pelvis was performed using the standard protocol following bolus administration of intravenous contrast.  CONTRAST:  100mL OMNIPAQUE IOHEXOL 300 MG/ML  SOLN  COMPARISON:  CT dated 05/16/2015  FINDINGS: Evaluation of this exam is limited due to streak artifact caused by patient's arms.  Lower chest:  Unremarkable.  Peritoneum: No free air or free fluid.  Liver: Unremarkable.No intrahepatic biliary ductal dilatation.  Gallbladder: Unremarkable. No calcified gallstone. No pericholecystic fluid.  Pancreas: Unremarkable.No ductal dilatation.  Spleen: Unremarkable.  Adrenals glands: Unremarkable.  Kidneys, ureters, urinary bladder: No hydronephrosis.The urinary bladder is grossly unremarkable.  Reproductive: Unremarkable.  Bowel and appendix: Constipation. No bowel obstruction. Multiple normal caliber fecalized loops of small bowel noted suggestive of chronic  stasis.The appendix is unremarkable.  Vascular/Lymphatic: Unremarkable.No lymphadenopathy.  Abdominal wall/Musculoskeletal: There has been significant interval decrease in the size of the left pelvic sidewall collections. There are two percutaneous pigtail drainage catheters, one positioned between the left psoas and iliacus muscle and the upper in the lower pelvic sidewall. There is focal heterogeneous attenuating tissue along the left psoas muscle at the level of the left SI joint in the region of the previous abscess likely representing residual fluid or phlegmon. No drainable fluid collection identified adjacent to the tips of the drainage catheters.  IMPRESSION: Decrease in the size of the previously seen pelvic side wall fluid collections/abscesses. Small residual phlegmon/abscess is present in the location the superior collection. No drainable fluid identified in the region of the previously seen inferior location within the pelvis.   Electronically Signed   By: Elgie Collard M.D.   On: 05/22/2015 19:28    Labs:  CBC:  Recent Labs  05/17/15 0520 05/18/15 0356 05/21/15 0602 05/23/15 0455  WBC 17.6* 13.0* 11.4* 10.9*  HGB 9.3* 9.6* 9.6* 10.3*  HCT 27.9* 29.7* 30.1* 32.1*  PLT 446* 514* 571* 607*    COAGS:  Recent Labs  05/15/15 1625  INR 1.33    BMP:  Recent Labs  05/17/15 0520 05/18/15 0356 05/21/15 0602 05/23/15 0455  NA 132* 137 134* 135  K 4.0 3.9 4.7 4.5  CL 98* 106 94* 96*  CO2 GLUCOSE 107* 161* 95 106*  BUN CALCIUM 7.7* 7.9* 8.7* 9.0  CREATININE 0.61 0.65 0.70 0.78  GFRNONAA >60 >60 >60 >60  GFRAA >60 >60 >60 >60    LIVER FUNCTION TESTS:  Recent Labs  05/15/15 1625 05/17/15 0520 05/21/15 0602  BILITOT 1.2 0.9 0.4  AST 69* 60* 27  ALT 174* 120* 69*  ALKPHOS 381* 341* 267*  PROT 7.6 6.2* 7.4  ALBUMIN 2.3* 1.7* 2.0*    Assessment and Plan:  Abscess drains intact Discussed with Dr Miles Costain He has reviewed imaging Rec:  keep BOTH drains in place for now Continue to flush 10 cc sterile saline 1-2x/day IR drain clinic will call pt with appt time and date for re check CT and drain inj Pt aware this will be 7/12 or so  Signed: Lisaanne Lawrie A 05/23/2015, 1:15 PM   I spent a total of 15 Minutes in face to face in clinical consultation/evaluation, greater than 50% of which was counseling/coordinating care for abscess drains

## 2015-05-23 NOTE — Discharge Summary (Signed)
[facility]  Physician Discharge Summary   Patient ID: [name] MRN: [id number] DOB/AGE: [birth year] 39 y.o.  Admit date: 05/15/2015 Discharge date: 05/23/2015  PCP: [facility]  DISCHARGE DIAGNOSES:  Active Problems:   Iliopsoas abscess   Leukocytosis   Hyponatremia   Psoas abscess   MRSA bacteremia   RECOMMENDATIONS FOR OUTPATIENT FOLLOW UP: 1. Patient to have close follow-up as an outpatient with interventional radiology and infectious disease clinic 2. Recommend checking his liver function tests and CBC in the next 1-2 weeks. 3. Home health arranged for IV antibiotics.  DISCHARGE CONDITION: fair  Diet recommendation: Regular  Filed Weights   05/15/15 1340  Weight: 90.719 kg (200 lb)    INITIAL HISTORY: 39 year old Caucasian male presented with complaints of left-sided back pain and leg pain. Evaluation in the emergency department included a CT scan of the abdomen which revealed an iliopsoas abscess on the left. He was hospitalized for further management.  Consultants: Gen. surgery, interventional radiology, infectious diseases  Procedures: Percutaneous drain placement. PICC line placement  2-D echocardiogram Study Conclusions - Left ventricle: The cavity size was normal. Wall thickness wasnormal. Systolic function was vigorous. The estimated ejectionfraction was in the range of 65% to 70%. Wall motion was normal;there were no regional wall motion abnormalities. Leftventricular diastolic function parameters were normal. - Aortic valve: Mildly calcified annulus. Trileaflet; mildlythickened leaflets. Valve area (VTI): 2.47 cm^2. Valve area(Vmax): 2.62 cm^2. - Mitral valve: Mildly calcified annulus. Mildly thickened leaflets   HOSPITAL COURSE:   Left iliopsoas abscesses.  S/P perc drain. Patient was being followed closely by interventional radiology and general surgery. Pain continued to be a main issue for many  days. Patient had good output from his drains. He was also seen by infectious diseases and they made their recommendations regarding IV antibiotics. He had repeat CT scan yesterday as recommended by general surgery, which shows improvement in the fluid collections. IR recommends that the drains be continued for now. They will follow him in the drain clinic. Pain medications will be prescribed. IV antibiotics to be arranged by home health. PICC line has been placed. He will need intravenous antibiotics for 4 weeks total. He has 3 more weeks remaining. He had positive blood cultures for MRSA. HIV non-reactive. 2-D echo negative for vegetations.  MRSA bacteremia IV vancomycin for 3 more weeks. He will be followed at the ID clinic.   Elevated alkaline phosphatase, AST and LFT/thrombocytosis This may be reflection of inflammatory reaction. Monitor closely. Seems to be improving. Can be followed as outpatient.  Hyponatremia Improved and stable.   Leukocytosis Improving. He remains afebrile  Normocytic anemia Stable. No overt bleeding.  Nicotine abuse Nicotine cessation counseling done  Overall improved. He has been ambulating without any difficulties. Continues to have pain in the left lower quadrant. He also has been constipated. He is on aggressive bowel regimen. He is passing more gas today. Explained to him the importance of taking stool softeners and lactulose while he is on high-dose narcotics. Overall stable for discharge today.   PERTINENT LABS:  The results of significant diagnostics from this hospitalization (including imaging, microbiology, ancillary and laboratory) are listed below for reference.    Microbiology: Recent Results (from the past 240 hour(s))  Culture, blood (routine x 2)     Status: None   Collection Time: 05/15/15  4:00 PM  Result Value Ref Range Status   Specimen Description BLOOD LEFT FOREARM DRAWN BY RN  Final  Special Requests   Final    BOTTLES DRAWN  AEROBIC AND ANAEROBIC AEB=6CC ANA=4CC   Culture NO GROWTH 5 DAYS  Final   Report Status 05/20/2015 FINAL  Final  Urine culture     Status: None   Collection Time: 05/15/15  4:13 PM  Result Value Ref Range Status   Specimen Description URINE, CLEAN CATCH  Final   Special Requests NONE  Final   Culture   Final    >=100,000 COLONIES/mL METHICILLIN RESISTANT STAPHYLOCOCCUS AUREUS Performed at [facility]    Report Status 05/19/2015 FINAL  Final   Organism ID, Bacteria METHICILLIN RESISTANT STAPHYLOCOCCUS AUREUS  Final      Susceptibility   Methicillin resistant staphylococcus aureus - MIC*    CIPROFLOXACIN >=8 RESISTANT Resistant     GENTAMICIN <=0.5 SENSITIVE Sensitive     NITROFURANTOIN <=16 SENSITIVE Sensitive     OXACILLIN >=4 RESISTANT Resistant     TETRACYCLINE <=1 SENSITIVE Sensitive     VANCOMYCIN 1 SENSITIVE Sensitive     TRIMETH/SULFA <=10 SENSITIVE Sensitive     CLINDAMYCIN <=0.25 SENSITIVE Sensitive     RIFAMPIN <=0.5 SENSITIVE Sensitive     Inducible Clindamycin NEGATIVE Sensitive     * >=100,000 COLONIES/mL METHICILLIN RESISTANT STAPHYLOCOCCUS AUREUS  Culture, blood (routine x 2)     Status: None   Collection Time: 05/15/15  4:25 PM  Result Value Ref Range Status   Specimen Description BLOOD RIGHT WRIST  Final   Special Requests BOTTLES DRAWN AEROBIC AND ANAEROBIC 8CC EACH  Final   Culture  Setup Time   Final    GRAM POSITIVE COCCI IN CLUSTERS IN BOTH AEROBIC AND ANAEROBIC BOTTLES CRITICAL RESULT CALLED TO, READ BACK BY AND VERIFIED WITH: HUDSON K. AT [facility] AT 1043A ON [id number] BY THOMPSON S.    Culture   Final    METHICILLIN RESISTANT STAPHYLOCOCCUS AUREUS Performed at [facility]    Report Status 05/18/2015 FINAL  Final   Organism ID, Bacteria METHICILLIN RESISTANT STAPHYLOCOCCUS AUREUS  Final      Susceptibility   Methicillin resistant staphylococcus aureus - MIC*    CIPROFLOXACIN >=8 RESISTANT Resistant     ERYTHROMYCIN >=8 RESISTANT Resistant       GENTAMICIN <=0.5 SENSITIVE Sensitive     OXACILLIN >=4 RESISTANT Resistant     TETRACYCLINE <=1 SENSITIVE Sensitive     VANCOMYCIN 1 SENSITIVE Sensitive     TRIMETH/SULFA <=10 SENSITIVE Sensitive     CLINDAMYCIN <=0.25 SENSITIVE Sensitive     RIFAMPIN <=0.5 SENSITIVE Sensitive     Inducible Clindamycin NEGATIVE Sensitive     * METHICILLIN RESISTANT STAPHYLOCOCCUS AUREUS  MRSA PCR Screening     Status: Abnormal   Collection Time: 05/16/15  9:08 AM  Result Value Ref Range Status   MRSA by PCR POSITIVE (A) NEGATIVE Final    Comment: RESULT CALLED TO, READ BACK BY AND VERIFIED WITH: C. HUDSON RN 10:50 05/16/15 ([email or web address])        The GeneXpert MRSA Assay (FDA approved for NASAL specimens only), is one component of a comprehensive MRSA colonization surveillance program. It is not intended to diagnose MRSA infection nor to guide or monitor treatment for MRSA infections.   Culture, routine-abscess     Status: None   Collection Time: 05/16/15  3:47 PM  Result Value Ref Range Status   Specimen Description ABSCESS  Final   Special Requests   Final    CRANIAL ASPECT OF THE LEFT ILIOPSOAS ABSCESS SYRINGE 1   Gram  Stain   Final    ABUNDANT WBC PRESENT,BOTH PMN AND MONONUCLEAR NO SQUAMOUS EPITHELIAL CELLS SEEN MODERATE GRAM POSITIVE COCCI IN PAIRS IN CLUSTERS Performed at [facility]    Culture   Final    ABUNDANT METHICILLIN RESISTANT STAPHYLOCOCCUS AUREUS Note: RIFAMPIN AND GENTAMICIN SHOULD NOT BE USED AS SINGLE DRUGS FOR TREATMENT OF STAPH INFECTIONS. This organism is presumed to be Clindamycin resistant based on detection of inducible Clindamycin resistance. CRITICAL RESULT CALLED TO, READ BACK BY AND  VERIFIED WITH: TRANACE D@ 0856 ON [id number] BY [facility] Performed at [facility]    Report Status 05/19/2015 FINAL  Final   Organism ID, Bacteria METHICILLIN RESISTANT STAPHYLOCOCCUS AUREUS  Final      Susceptibility   Methicillin resistant staphylococcus aureus -  MIC*    CLINDAMYCIN <=0.25 SENSITIVE Sensitive     ERYTHROMYCIN >=8 RESISTANT Resistant     GENTAMICIN <=0.5 SENSITIVE Sensitive     LEVOFLOXACIN 4 INTERMEDIATE Intermediate     OXACILLIN >=4 RESISTANT Resistant     PENICILLIN >=0.5 RESISTANT Resistant     RIFAMPIN <=0.5 SENSITIVE Sensitive     TRIMETH/SULFA <=10 SENSITIVE Sensitive     VANCOMYCIN 1 SENSITIVE Sensitive     TETRACYCLINE 2 SENSITIVE Sensitive     * ABUNDANT METHICILLIN RESISTANT STAPHYLOCOCCUS AUREUS  Culture, blood (routine x 2)     Status: None (Preliminary result)   Collection Time: 05/18/15 12:30 PM  Result Value Ref Range Status   Specimen Description BLOOD LEFT HAND  Final   Special Requests BOTTLES DRAWN AEROBIC AND ANAEROBIC 5CC  Final   Culture NO GROWTH 4 DAYS  Final   Report Status PENDING  Incomplete  Culture, blood (routine x 2)     Status: None (Preliminary result)   Collection Time: 05/18/15 12:40 PM  Result Value Ref Range Status   Specimen Description BLOOD LEFT HAND  Final   Special Requests BOTTLES DRAWN AEROBIC AND ANAEROBIC 5CC  Final   Culture NO GROWTH 4 DAYS  Final   Report Status PENDING  Incomplete     Labs: Basic Metabolic Panel:  Recent Labs Lab 05/17/15 0520 05/18/15 0356 05/21/15 0602 05/23/15 0455  NA 132* 137 134* 135  K 4.0 3.9 4.7 4.5  CL 98* 106 94* 96*  CO2 27 25 31 31   GLUCOSE 107* 161* 95 106*  BUN 8 15 8 16   CREATININE 0.61 0.65 0.70 0.78  CALCIUM 7.7* 7.9* 8.7* 9.0   Liver Function Tests:  Recent Labs Lab 05/17/15 0520 05/21/15 0602  AST 60* 27  ALT 120* 69*  ALKPHOS 341* 267*  BILITOT 0.9 0.4  PROT 6.2* 7.4  ALBUMIN 1.7* 2.0*   CBC:  Recent Labs Lab 05/17/15 0520 05/18/15 0356 05/21/15 0602 05/23/15 0455  WBC 17.6* 13.0* 11.4* 10.9*  HGB 9.3* 9.6* 9.6* 10.3*  HCT 27.9* 29.7* 30.1* 32.1*  MCV 81.3 82.5 83.8 83.6  PLT 446* 514* 571* 607*    IMAGING STUDIES Dg Chest 2 View  05/15/2015   CLINICAL DATA:  Smoker. Difficulty eating with  abdominal pain. Swelling in rash involving both feet. Back pain.  EXAM: CHEST  2 VIEW  COMPARISON:  None.  FINDINGS: Cardiomediastinal silhouette is within normal limits. The lungs are mildly hypoinflated without evidence of airspace consolidation, edema, pleural effusion, or pneumothorax. No acute osseous abnormality is identified.  IMPRESSION: No active cardiopulmonary disease.   Electronically Signed   By: Sebastian Ache   On: 05/15/2015 16:51   Mr Lumbar Spine Wo Contrast  05/15/2015   CLINICAL DATA:  39 year old male with severe lumbar back pain involving the left lower extremity x1 month. Weakness.  EXAM: MRI LUMBAR SPINE WITHOUT CONTRAST  TECHNIQUE: Multiplanar, multisequence MR imaging of the lumbar spine was performed. No intravenous contrast was administered.  COMPARISON:  Lumbar spine radiographs 11/22/2012.  FINDINGS: Study is intermittently degraded by motion artifact despite repeated imaging attempts.  Normal lumbar segmentation demonstrated in 2014. Stable vertebral height and alignment.  There is disc desiccation at T11-T12 and disc bulge which results in borderline to mild spinal stenosis. No mass effect on the lower thoracic spinal cord at this level. No signal abnormality in the lower thoracic spinal cord or conus, the tip of the conus is at L1-L2. The T12-L1 through L3-L4 discs then are normal.  There is severe edema in the medial and posterior left psoas muscle beginning at the L2-L3 level but increasing along the caudal aspect of the muscle. There is also severe edema in the visible left iliacus muscle and in the left posterior paraspinal muscles (erector spine I) beginning at the L4-L5 level and continuing inferiorly. There is some evidence of left sacral ala marrow edema (series 5, image 15).  There is a superimposed posterior epidural mass extending from the mid L4 to the mid S1 level. This measures up to 10 mm AP thickness and contributes to severe spinal stenosis at the L4-L5 and L5-S1  levels due to mass effect on the posterior thecal sac (series 7, image 31). There is chronic disc degeneration at each 1 of these levels with disc bulging and central disc herniation.  Furthermore there is abnormal presacral STIR signal (series 5, image 19).  The right side paraspinal muscles and soft tissues appear normal.  IMPRESSION: 1. Marked abnormality in the left psoas, iliacus, and lower paraspinal muscles. The epicenter seems to be the left sacral ala which appears edematous. Leading consideration at this point is septic left SI joint. In light of the patient's difficulty with MRI, CT Abdomen and Pelvis with oral and IV contrast may be most valuable at this point. 2. Widespread posterior epidural space-occupying mass might be infectious phlegmon. This contributes to severe spinal stenosis at L4-L5 and L5-S1 due to posterior thecal sac mass effect. There is underlying L4-L5 and L5-S1 disc degeneration. 3. No definite marrow edema or acute osseous abnormality in the lumbar spine. Study discussed by telephone with Dr. Vanetta MuldersSCOTT ZACKOWSKI on 05/15/2015 at 1730 hr.   Electronically Signed   By: Odessa FlemingH  Hall M.D.   On: 05/15/2015 18:12   Ct Abdomen Pelvis W Contrast  05/22/2015   CLINICAL DATA:  39 year old male with left iliopsoas abscess.  EXAM: CT ABDOMEN AND PELVIS WITH CONTRAST  TECHNIQUE: Multidetector CT imaging of the abdomen and pelvis was performed using the standard protocol following bolus administration of intravenous contrast.  CONTRAST:  100mL OMNIPAQUE IOHEXOL 300 MG/ML  SOLN  COMPARISON:  CT dated 05/16/2015  FINDINGS: Evaluation of this exam is limited due to streak artifact caused by patient's arms.  Lower chest:  Unremarkable.  Peritoneum: No free air or free fluid.  Liver: Unremarkable.No intrahepatic biliary ductal dilatation.  Gallbladder: Unremarkable. No calcified gallstone. No pericholecystic fluid.  Pancreas: Unremarkable.No ductal dilatation.  Spleen: Unremarkable.  Adrenals glands:  Unremarkable.  Kidneys, ureters, urinary bladder: No hydronephrosis.The urinary bladder is grossly unremarkable.  Reproductive: Unremarkable.  Bowel and appendix: Constipation. No bowel obstruction. Multiple normal caliber fecalized loops of small bowel noted suggestive of chronic stasis.The appendix is unremarkable.  Vascular/Lymphatic: Unremarkable.No  lymphadenopathy.  Abdominal wall/Musculoskeletal: There has been significant interval decrease in the size of the left pelvic sidewall collections. There are two percutaneous pigtail drainage catheters, one positioned between the left psoas and iliacus muscle and the upper in the lower pelvic sidewall. There is focal heterogeneous attenuating tissue along the left psoas muscle at the level of the left SI joint in the region of the previous abscess likely representing residual fluid or phlegmon. No drainable fluid collection identified adjacent to the tips of the drainage catheters.  IMPRESSION: Decrease in the size of the previously seen pelvic side wall fluid collections/abscesses. Small residual phlegmon/abscess is present in the location the superior collection. No drainable fluid identified in the region of the previously seen inferior location within the pelvis.   Electronically Signed   By: Elgie Collard M.D.   On: 05/22/2015 19:28   Ct Abdomen Pelvis W Contrast  05/15/2015   CLINICAL DATA:  Patient with rash and swelling to the bilateral feet. Lower abdominal pain for 1 month.  EXAM: CT ABDOMEN AND PELVIS WITH CONTRAST  TECHNIQUE: Multidetector CT imaging of the abdomen and pelvis was performed using the standard protocol following bolus administration of intravenous contrast.  CONTRAST:  OMNIPAQUE IOHEXOL 300 MG/ML SOLN, 25mL OMNIPAQUE IOHEXOL 300 MG/ML SOLN  COMPARISON:  MRI lumbar spine 05/15/2015  FINDINGS: Lower chest: Normal heart size. No consolidative or nodular pulmonary opacities. No pleural effusion.  Hepatobiliary: Liver is normal in  size and contour without focal hepatic lesion identified. Gallbladder is decompressed. No intrahepatic or extrahepatic biliary ductal dilatation.  Pancreas: Unremarkable  Spleen: Multiple calcified granulomata.  Adrenals/Urinary Tract: Adrenal glands are normal. Kidneys enhance symmetrically with contrast. No hydronephrosis.  Stomach/Bowel: No abnormal bowel wall thickening or evidence for bowel obstruction. The appendix is normal. No free intraperitoneal air.  Vascular/Lymphatic: There is significant mass effect on the left internal and external iliac veins from adjacent loculated fluid collection. Normal caliber abdominal aorta.  Other: None  Musculoskeletal: There is expansion of left psoas muscle from the approximate L2 vertebral level extending caudally were there is also expansion of the iliacus muscle. Near the junction there are multiple adjacent loculated appearing rim enhancing fluid collections with at least 1 of these collections appearing to contain small foci of gas. Collection within the left iliacus musculature measures 4.9 x 4.5 x 7.8 cm (image 67; series 2). Collections appear to extend into the left pelvic sidewall musculature. There appears to be some fat stranding about the proximal left lower extremity musculature. No definite osseous cortical destruction identified either involving the lower lumbar spine or pelvis.  IMPRESSION: Abnormal thickening and expansion of the left ileopsoas musculature which appears to contain multiple rim enhancing gas containing fluid collections most compatible with abscesses. It is not clear the exact origin of this apparent infectious process. No definite evidence for osseous erosion of the lumbar spine or pelvis on CT imaging.  The muscular expansion and abscesses exert significant mass effect on the left internal and external iliac veins.  Left hemi-pelvic and inguinal adenopathy likely reactive.   Electronically Signed   By: Annia Belt M.D.   On: 05/15/2015  19:29   Ct Image Guided Drainage By Percutaneous Catheter  05/16/2015   INDICATION: History of back pain for 1 month with preceding lumbar spine MRI and contrast-enhanced abdominal CT demonstrating a large left sided psoas and iliacus abscess. Please perform CT-guided percutaneous drainage catheter placement.  EXAM: CT IMAGE GUIDED DRAINAGE BY PERCUTANEOUS CATHETER x2  COMPARISON:  Lumbar spine MRI - 05/15/2015; CT abdomen pelvis - 05/15/2015  MEDICATIONS: The patient is currently admitted to the hospital and receiving intravenous antibiotics. The antibiotics were administered within an appropriate time frame prior to the initiation of the procedure.  ANESTHESIA/SEDATION: Fentanyl 200 mcg IV; Versed 3 mg IV  Total Moderate Sedation time  30 minutes  CONTRAST:  None  COMPLICATIONS: None immediate  PROCEDURE: Informed written consent was obtained from the patient after a discussion of the risks, benefits and alternatives to treatment. The patient was placed supine on the CT gantry and a pre procedural CT was performed re-demonstrating the known abscess/fluid collection within the caudal aspect of the iliopsoas and iliacus musculature with dominant cranial component measuring approximately 5.1 x 6.0 cm (image 22, series 2) and dominant caudal component measuring approximately 5.9 x 3.9 cm (image 36, series 2). The procedure was planned. A timeout was performed prior to the initiation of the procedure.  The skin overlying the anterior aspect of the left lower abdomen was prepped and draped in the usual sterile fashion.  Attention was initially paid towards the dominant caudal component located primarily within in the caudal aspect of the iliopsoas musculature. The overlying soft tissues were anesthetized with 1% lidocaine with epinephrine. Appropriate trajectory was planned with the use of a 22 gauge spinal needle. An 18 gauge trocar needle was advanced into the abscess/fluid collection and a short Amplatz super  stiff wire was coiled within the collection. Appropriate positioning was confirmed with a limited CT scan. The tract was serially dilated allowing placement of a 10 [place] all-purpose drainage catheter. Appropriate positioning was confirmed with a limited postprocedural CT scan.  A total of approximately 110 cc of purulent fluid was aspirated. The tube was connected to a JP bulb and sutured in place.  Following the placement of the initial percutaneous drainage catheter, CT imaging was performed demonstrating grossly unchanged size and appearance of the separate dominant component located within the iliacus musculature and as such the decision was made to place an additional percutaneous drainage catheter.  The overlying soft tissues were anesthetized with 1% lidocaine with epinephrine. The overlying soft tissues were anesthetized with 1% lidocaine with epinephrine. Appropriate trajectory was planned with the use of a 22 gauge spinal needle. An 18 gauge trocar needle was advanced into the abscess/fluid collection and a short Amplatz super stiff wire was coiled within the collection. Appropriate positioning was confirmed with a limited CT scan. The tract was serially dilated allowing placement of a 10 [place] all-purpose drainage catheter. Appropriate positioning was confirmed with a limited postprocedural CT scan.  A total of approximately 40 cc of purulent material was aspirated from the separate fluid collection. The tube was connected to a JP bulb and sutured in place.  Dressings were placed. The patient tolerated the procedure well without immediate postprocedural complication.  IMPRESSION: 1. Successful CT guided placement of a 10 [place] all purpose drain catheter into the caudal aspect of the left iliopsoas musculature with aspiration of approximately 110 mL of purulent fluid. Samples were sent to the laboratory as requested by the ordering clinical team. 2. Successful CT-guided placement of a 10 [place]  all-purpose drainage catheter into the dominant component within the left iliacus musculature with aspiration of approximately 40 cc of purulent material. Samples were sent to the laboratory as requested by the ordering clinical team.   Electronically Signed   By: Simonne Come M.D.   On: 05/16/2015 16:42   Ct Image Guided Drainage By Percutaneous  Catheter  05/16/2015   INDICATION: History of back pain for 1 month with preceding lumbar spine MRI and contrast-enhanced abdominal CT demonstrating a large left sided psoas and iliacus abscess. Please perform CT-guided percutaneous drainage catheter placement.  EXAM: CT IMAGE GUIDED DRAINAGE BY PERCUTANEOUS CATHETER x2  COMPARISON:  Lumbar spine MRI - 05/15/2015; CT abdomen pelvis - 05/15/2015  MEDICATIONS: The patient is currently admitted to the hospital and receiving intravenous antibiotics. The antibiotics were administered within an appropriate time frame prior to the initiation of the procedure.  ANESTHESIA/SEDATION: Fentanyl 200 mcg IV; Versed 3 mg IV  Total Moderate Sedation time  30 minutes  CONTRAST:  None  COMPLICATIONS: None immediate  PROCEDURE: Informed written consent was obtained from the patient after a discussion of the risks, benefits and alternatives to treatment. The patient was placed supine on the CT gantry and a pre procedural CT was performed re-demonstrating the known abscess/fluid collection within the caudal aspect of the iliopsoas and iliacus musculature with dominant cranial component measuring approximately 5.1 x 6.0 cm (image 22, series 2) and dominant caudal component measuring approximately 5.9 x 3.9 cm (image 36, series 2). The procedure was planned. A timeout was performed prior to the initiation of the procedure.  The skin overlying the anterior aspect of the left lower abdomen was prepped and draped in the usual sterile fashion.  Attention was initially paid towards the dominant caudal component located primarily within in the caudal  aspect of the iliopsoas musculature. The overlying soft tissues were anesthetized with 1% lidocaine with epinephrine. Appropriate trajectory was planned with the use of a 22 gauge spinal needle. An 18 gauge trocar needle was advanced into the abscess/fluid collection and a short Amplatz super stiff wire was coiled within the collection. Appropriate positioning was confirmed with a limited CT scan. The tract was serially dilated allowing placement of a 10 [place] all-purpose drainage catheter. Appropriate positioning was confirmed with a limited postprocedural CT scan.  A total of approximately 110 cc of purulent fluid was aspirated. The tube was connected to a JP bulb and sutured in place.  Following the placement of the initial percutaneous drainage catheter, CT imaging was performed demonstrating grossly unchanged size and appearance of the separate dominant component located within the iliacus musculature and as such the decision was made to place an additional percutaneous drainage catheter.  The overlying soft tissues were anesthetized with 1% lidocaine with epinephrine. The overlying soft tissues were anesthetized with 1% lidocaine with epinephrine. Appropriate trajectory was planned with the use of a 22 gauge spinal needle. An 18 gauge trocar needle was advanced into the abscess/fluid collection and a short Amplatz super stiff wire was coiled within the collection. Appropriate positioning was confirmed with a limited CT scan. The tract was serially dilated allowing placement of a 10 [place] all-purpose drainage catheter. Appropriate positioning was confirmed with a limited postprocedural CT scan.  A total of approximately 40 cc of purulent material was aspirated from the separate fluid collection. The tube was connected to a JP bulb and sutured in place.  Dressings were placed. The patient tolerated the procedure well without immediate postprocedural complication.  IMPRESSION: 1. Successful CT guided placement  of a 10 [place] all purpose drain catheter into the caudal aspect of the left iliopsoas musculature with aspiration of approximately 110 mL of purulent fluid. Samples were sent to the laboratory as requested by the ordering clinical team. 2. Successful CT-guided placement of a 10 French all-purpose drainage catheter into the dominant component  within the left iliacus musculature with aspiration of approximately 40 cc of purulent material. Samples were sent to the laboratory as requested by the ordering clinical team.   Electronically Signed   By: Simonne Come M.D.   On: 05/16/2015 16:42    DISCHARGE EXAMINATION: Filed Vitals:   05/21/15 2158 05/22/15 1755 05/22/15 2029 05/23/15 0659  BP: 121/65 111/63 123/62 119/62  Pulse: 99 101 95 87  Temp:  98.4 F (36.9 C) 98.4 F (36.9 C) 97.7 F (36.5 C)  TempSrc: Oral Oral Oral   Resp: 19 18 17 18   Height:      Weight:      SpO2: 98% 99% 100% 98%   General appearance: alert, cooperative, appears stated age and no distress Resp: clear to auscultation bilaterally GI: Dressing over the lower left part of the abdomen. Drains noted. Slightly tender to palpation. Bowel sounds present. Extremities: extremities normal, atraumatic, no cyanosis or edema Neurologic: Alert and oriented X 3, normal strength and tone. Normal symmetric reflexes. Normal coordination and gait  DISPOSITION: Home  Discharge Instructions    Call MD for:  difficulty breathing, headache or visual disturbances    Complete by:  As directed      Call MD for:  extreme fatigue    Complete by:  As directed      Call MD for:  persistant dizziness or light-headedness    Complete by:  As directed      Call MD for:  persistant nausea and vomiting    Complete by:  As directed      Call MD for:  redness, tenderness, or signs of infection (pain, swelling, redness, odor or green/yellow discharge around incision site)    Complete by:  As directed      Call MD for:  severe uncontrolled pain     Complete by:  As directed      Call MD for:  temperature >100.4    Complete by:  As directed      Diet general    Complete by:  As directed      Discharge instructions    Complete by:  As directed   Continue to flush 10 cc sterile saline 1-2x/day. IR will call for appointment. Infectious diseases clinic will also call for appointment.  You were cared for by a hospitalist during your hospital stay. If you have any questions about your discharge medications or the care you received while you were in the hospital after you are discharged, you can call the unit and asked to speak with the hospitalist on call if the hospitalist that took care of you is not available. Once you are discharged, your primary care physician will handle any further medical issues. Please note that NO REFILLS for any discharge medications will be authorized once you are discharged, as it is imperative that you return to your primary care physician (or establish a relationship with a primary care physician if you do not have one) for your aftercare needs so that they can reassess your need for medications and monitor your lab values. If you do not have a primary care physician, you can call [phone] for a physician referral.     Increase activity slowly    Complete by:  As directed            ALLERGIES:  Allergies  Allergen Reactions  . Vicodin [Hydrocodone-Acetaminophen] Hives and Itching  . Adhesive [Tape]      Current Discharge Medication List  START taking these medications   Details  docusate sodium (COLACE) 100 MG capsule Take 1 capsule (100 mg total) by mouth 2 (two) times daily. Qty: 60 capsule, Refills: 0    methocarbamol (ROBAXIN) 500 MG tablet Take 1 tablet (500 mg total) by mouth every 8 (eight) hours as needed for muscle spasms. Qty: 60 tablet, Refills: 0    oxyCODONE 10 MG TABS Take 1-2 tablets (10-20 mg total) by mouth every 4 (four) hours as needed (  for moderate pain,  for severe  pain). Qty: 90 tablet, Refills: 0    polyethylene glycol (MIRALAX / GLYCOLAX) packet Take 17 g by mouth 2 (two) times daily. May change to as needed once with regular bowel movements. Qty: 60 each, Refills: 0    senna (SENOKOT) 8.6 MG TABS tablet Take 1 tablet (8.6 mg total) by mouth at bedtime. Qty: 30 each, Refills: 0    traMADol (ULTRAM) 50 MG tablet Take 2 tablets (100 mg total) by mouth every 6 (six) hours. Qty: 60 tablet, Refills: 0    vancomycin 1,500 mg in sodium chloride 0.9 % 500 mL Inject 1,500 mg into the vein every 12 (twelve) hours. For 3 more weeks Qty: 42 each, Refills: 0      CONTINUE these medications which have NOT CHANGED   Details  diphenhydramine-acetaminophen (TYLENOL PM) 25-500 MG TABS Take 1 tablet by mouth at bedtime as needed.      STOP taking these medications     cyclobenzaprine (FLEXERIL) 5 MG tablet      meloxicam (MOBIC) 7.5 MG tablet      predniSONE (DELTASONE) 10 MG tablet        Follow-up Information    Follow up with [facility].   Why:  They will contact you to schedule home nurse visits.    Contact information:   [address] [place] [address] [phone]       TOTAL DISCHARGE TIME: 35 minutes  [facility]  [facility] Pager [phone]  05/23/2015, 2:03 PM

## 2015-05-23 NOTE — Hospital Discharge Follow-Up (Signed)
Call received from Jacquelynn CreeMary Krieg, RN CM indicating patient needing hospital follow-up appointment. Patient does not have a PCP and is uninsured. Appointment obtained on 05/28/15 at 1200 with [facility]Ad[facility].  Appointment time communicated Jacquelynn CreeMary Krieg, RN CM.

## 2015-05-23 NOTE — Progress Notes (Signed)
Patient ID: [name], male   DOB: 1976/09/07, 39 y.o.   MRN: [id number]         [facility]    Date of Admission:  05/15/2015           Day 9 vancomycin  Active Problems:   Iliopsoas abscess   Leukocytosis   Hyponatremia   Psoas abscess   . docusate sodium  100 mg Oral BID  . polyethylene glycol  17 g Oral BID  . senna  1 tablet Oral QHS  . traMADol  100 mg Oral 4 times per day  . vancomycin  1,500 mg Intravenous Q12H    Review of Systems: Review of systems not obtained due to patient factors.  Past Medical History  Diagnosis Date  . H/O back injury   . MRSA (methicillin resistant Staphylococcus aureus)     History  Substance Use Topics  . Smoking status: Heavy Tobacco Smoker -- 1.00 packs/day    Types: Cigarettes  . Smokeless tobacco: Never Used  . Alcohol Use: No    Family History  Problem Relation Age of Onset  . Cancer Father    Allergies  Allergen Reactions  . Vicodin [Hydrocodone-Acetaminophen] Hives and Itching  . Adhesive [Tape]     OBJECTIVE: Blood pressure 119/62, pulse 87, temperature 97.7 F (36.5 C), temperature source Oral, resp. rate 18, height 6' (1.829 m), weight 200 lb (90.719 kg), SpO2 98 %. General: He is currently out of his room 90 mL of output from his 2 left psoas drains yesterday  Lab Results Lab Results  Component Value Date   WBC 10.9* 05/23/2015   HGB 10.3* 05/23/2015   HCT 32.1* 05/23/2015   MCV 83.6 05/23/2015   PLT 607* 05/23/2015    Lab Results  Component Value Date   CREATININE 0.78 05/23/2015   BUN 16 05/23/2015   NA 135 05/23/2015   K 4.5 05/23/2015   CL 96* 05/23/2015   CO2 31 05/23/2015    Lab Results  Component Value Date   ALT 69* 05/21/2015   AST 27 05/21/2015   ALKPHOS 267* 05/21/2015   BILITOT 0.4 05/21/2015     Microbiology: Recent Results (from the past 240 hour(s))  Culture, blood (routine x 2)     Status: None   Collection Time: 05/15/15  4:00 PM  Result  Value Ref Range Status   Specimen Description BLOOD LEFT FOREARM DRAWN BY RN  Final   Special Requests   Final    BOTTLES DRAWN AEROBIC AND ANAEROBIC AEB=6CC ANA=4CC   Culture NO GROWTH 5 DAYS  Final   Report Status 05/20/2015 FINAL  Final  Urine culture     Status: None   Collection Time: 05/15/15  4:13 PM  Result Value Ref Range Status   Specimen Description URINE, CLEAN CATCH  Final   Special Requests NONE  Final   Culture   Final    >=100,000 COLONIES/mL METHICILLIN RESISTANT STAPHYLOCOCCUS AUREUS Performed at [facility]    Report Status 05/19/2015 FINAL  Final   Organism ID, Bacteria METHICILLIN RESISTANT STAPHYLOCOCCUS AUREUS  Final      Susceptibility   Methicillin resistant staphylococcus aureus - MIC*    CIPROFLOXACIN >=8 RESISTANT Resistant     GENTAMICIN <=0.5 SENSITIVE Sensitive     NITROFURANTOIN <=16 SENSITIVE Sensitive     OXACILLIN >=4 RESISTANT Resistant     TETRACYCLINE <=1 SENSITIVE Sensitive     VANCOMYCIN 1 SENSITIVE Sensitive     TRIMETH/SULFA <=  10 SENSITIVE Sensitive     CLINDAMYCIN <=0.25 SENSITIVE Sensitive     RIFAMPIN <=0.5 SENSITIVE Sensitive     Inducible Clindamycin NEGATIVE Sensitive     * >=100,000 COLONIES/mL METHICILLIN RESISTANT STAPHYLOCOCCUS AUREUS  Culture, blood (routine x 2)     Status: None   Collection Time: 05/15/15  4:25 PM  Result Value Ref Range Status   Specimen Description BLOOD RIGHT WRIST  Final   Special Requests BOTTLES DRAWN AEROBIC AND ANAEROBIC 8CC EACH  Final   Culture  Setup Time   Final    GRAM POSITIVE COCCI IN CLUSTERS IN BOTH AEROBIC AND ANAEROBIC BOTTLES CRITICAL RESULT CALLED TO, READ BACK BY AND VERIFIED WITH: HUDSON K. AT [facility] AT 1043A ON [id number] BY THOMPSON S.    Culture   Final    METHICILLIN RESISTANT STAPHYLOCOCCUS AUREUS Performed at [facility]    Report Status 05/18/2015 FINAL  Final   Organism ID, Bacteria METHICILLIN RESISTANT STAPHYLOCOCCUS AUREUS  Final      Susceptibility    Methicillin resistant staphylococcus aureus - MIC*    CIPROFLOXACIN >=8 RESISTANT Resistant     ERYTHROMYCIN >=8 RESISTANT Resistant     GENTAMICIN <=0.5 SENSITIVE Sensitive     OXACILLIN >=4 RESISTANT Resistant     TETRACYCLINE <=1 SENSITIVE Sensitive     VANCOMYCIN 1 SENSITIVE Sensitive     TRIMETH/SULFA <=10 SENSITIVE Sensitive     CLINDAMYCIN <=0.25 SENSITIVE Sensitive     RIFAMPIN <=0.5 SENSITIVE Sensitive     Inducible Clindamycin NEGATIVE Sensitive     * METHICILLIN RESISTANT STAPHYLOCOCCUS AUREUS  MRSA PCR Screening     Status: Abnormal   Collection Time: 05/16/15  9:08 AM  Result Value Ref Range Status   MRSA by PCR POSITIVE (A) NEGATIVE Final    Comment: RESULT CALLED TO, READ BACK BY AND VERIFIED WITH: C. HUDSON RN 10:50 05/16/15 ([email or web address])        The GeneXpert MRSA Assay (FDA approved for NASAL specimens only), is one component of a comprehensive MRSA colonization surveillance program. It is not intended to diagnose MRSA infection nor to guide or monitor treatment for MRSA infections.   Culture, routine-abscess     Status: None   Collection Time: 05/16/15  3:47 PM  Result Value Ref Range Status   Specimen Description ABSCESS  Final   Special Requests   Final    CRANIAL ASPECT OF THE LEFT ILIOPSOAS ABSCESS SYRINGE 1   Gram Stain   Final    ABUNDANT WBC PRESENT,BOTH PMN AND MONONUCLEAR NO SQUAMOUS EPITHELIAL CELLS SEEN MODERATE GRAM POSITIVE COCCI IN PAIRS IN CLUSTERS Performed at [facility]    Culture   Final    ABUNDANT METHICILLIN RESISTANT STAPHYLOCOCCUS AUREUS Note: RIFAMPIN AND GENTAMICIN SHOULD NOT BE USED AS SINGLE DRUGS FOR TREATMENT OF STAPH INFECTIONS. This organism is presumed to be Clindamycin resistant based on detection of inducible Clindamycin resistance. CRITICAL RESULT CALLED TO, READ BACK BY AND  VERIFIED WITH: TRANACE D@ 0856 ON [id number] BY [facility] Performed at [facility]    Report Status 05/19/2015 FINAL  Final   Organism  ID, Bacteria METHICILLIN RESISTANT STAPHYLOCOCCUS AUREUS  Final      Susceptibility   Methicillin resistant staphylococcus aureus - MIC*    CLINDAMYCIN <=0.25 SENSITIVE Sensitive     ERYTHROMYCIN >=8 RESISTANT Resistant     GENTAMICIN <=0.5 SENSITIVE Sensitive     LEVOFLOXACIN 4 INTERMEDIATE Intermediate     OXACILLIN >=4 RESISTANT Resistant     PENICILLIN >=  0.5 RESISTANT Resistant     RIFAMPIN <=0.5 SENSITIVE Sensitive     TRIMETH/SULFA <=10 SENSITIVE Sensitive     VANCOMYCIN 1 SENSITIVE Sensitive     TETRACYCLINE 2 SENSITIVE Sensitive     * ABUNDANT METHICILLIN RESISTANT STAPHYLOCOCCUS AUREUS  Culture, blood (routine x 2)     Status: None (Preliminary result)   Collection Time: 05/18/15 12:30 PM  Result Value Ref Range Status   Specimen Description BLOOD LEFT HAND  Final   Special Requests BOTTLES DRAWN AEROBIC AND ANAEROBIC 5CC  Final   Culture NO GROWTH 4 DAYS  Final   Report Status PENDING  Incomplete  Culture, blood (routine x 2)     Status: None (Preliminary result)   Collection Time: 05/18/15 12:40 PM  Result Value Ref Range Status   Specimen Description BLOOD LEFT HAND  Final   Special Requests BOTTLES DRAWN AEROBIC AND ANAEROBIC 5CC  Final   Culture NO GROWTH 4 DAYS  Final   Report Status PENDING  Incomplete    Assessment: It appears that he is improving clinically and radiographically on therapy for MRSA bacteremia and paraspinal abscesses. Repeat abdominal CT scan yesterday showed significant improvement in his abscesses. Repeat blood cultures are negative and he had no evidence of endocarditis by transthoracic echocardiogram. I do not think that the transesophageal echocardiogram is likely to change his management or outcome. He probably needs a minimum of 4 weeks of IV anabiotic therapy anyway.  Plan: 1. Continue IV vancomycin for 3 more weeks 2. I will sign off now and arrange follow-up in our clinic  Cliffton AstersJohn Sreeja Spies, MD [facility] [phone] pager   [phone] cell 05/23/2015, 1:22 PM

## 2015-05-23 NOTE — Progress Notes (Signed)
[name] discharged home per MD order. Discharge instructions reviewed and discussed with patient. All questions and concerns answered. Copy of instructions and scripts given to patient. PICC flushed and Hep locked by member of IV team.   Patient escorted to car by stepson (prematurely) in a wheelchair. No distress noted upon discharge.   Rosita FireScott, [name] 05/23/2015 4:52 PM

## 2015-05-23 NOTE — Discharge Instructions (Signed)
Continue to flush 10 cc sterile saline 1-2x/day. IR will schedule outpatient appointment.  Abscess An abscess is an infected area that contains a collection of pus and debris.It can occur in almost any part of the body. An abscess is also known as a furuncle or boil. CAUSES  An abscess occurs when tissue gets infected. This can occur from blockage of oil or sweat glands, infection of hair follicles, or a minor injury to the skin. As the body tries to fight the infection, pus collects in the area and creates pressure under the skin. This pressure causes pain. People with weakened immune systems have difficulty fighting infections and get certain abscesses more often.  SYMPTOMS Usually an abscess develops on the skin and becomes a painful mass that is red, warm, and tender. If the abscess forms under the skin, you may feel a moveable soft area under the skin. Some abscesses break open (rupture) on their own, but most will continue to get worse without care. The infection can spread deeper into the body and eventually into the bloodstream, causing you to feel ill.  DIAGNOSIS  Your caregiver will take your medical history and perform a physical exam. A sample of fluid may also be taken from the abscess to determine what is causing your infection. TREATMENT  Your caregiver may prescribe antibiotic medicines to fight the infection. However, taking antibiotics alone usually does not cure an abscess. Your caregiver may need to make a small cut (incision) in the abscess to drain the pus. In some cases, gauze is packed into the abscess to reduce pain and to continue draining the area. HOME CARE INSTRUCTIONS   Only take over-the-counter or prescription medicines for pain, discomfort, or fever as directed by your caregiver.  If you were prescribed antibiotics, take them as directed. Finish them even if you start to feel better.  If gauze is used, follow your caregiver's directions for changing the  gauze.  To avoid spreading the infection:  Keep your draining abscess covered with a bandage.  Wash your hands well.  Do not share personal care items, towels, or whirlpools with others.  Avoid skin contact with others.  Keep your skin and clothes clean around the abscess.  Keep all follow-up appointments as directed by your caregiver. SEEK MEDICAL CARE IF:   You have increased pain, swelling, redness, fluid drainage, or bleeding.  You have muscle aches, chills, or a general ill feeling.  You have a fever. MAKE SURE YOU:   Understand these instructions.  Will watch your condition.  Will get help right away if you are not doing well or get worse. Document Released: 08/18/2005 Document Revised: 05/09/2012 Document Reviewed: 01/21/2012 [facility] Patient Information 2015 [place]. This information is not intended to replace advice given to you by your health care provider. Make sure you discuss any questions you have with your health care provider.

## 2015-05-23 NOTE — Progress Notes (Signed)
[facility] 05[facility]28/15 at 12:00 noon. Apptment info given to patient and entered on d/c instructions.

## 2015-05-26 ENCOUNTER — Other Ambulatory Visit (HOSPITAL_COMMUNITY)
Admission: RE | Admit: 2015-05-26 | Discharge: 2015-05-26 | Disposition: A | Discharge status: Home / Self Care | Payer: Medicaid Other | Source: Skilled Nursing Facility | Attending: Internal Medicine | Admitting: Internal Medicine

## 2015-05-26 DIAGNOSIS — K6812 Psoas muscle abscess: Secondary | ICD-10-CM | POA: Insufficient documentation

## 2015-05-26 DIAGNOSIS — Z5181 Encounter for therapeutic drug level monitoring: Secondary | ICD-10-CM | POA: Diagnosis not present

## 2015-05-26 LAB — CBC WITH DIFFERENTIAL/PLATELET
BASOS PCT: 1 % (ref 0–1)
Basophils Absolute: 0.1 10*3/uL (ref 0.0–0.1)
EOS PCT: 1 % (ref 0–5)
Eosinophils Absolute: 0.1 10*3/uL (ref 0.0–0.7)
HCT: 30.5 % — ABNORMAL LOW (ref 39.0–52.0)
Hemoglobin: 9.7 g/dL — ABNORMAL LOW (ref 13.0–17.0)
Lymphocytes Relative: 15 % (ref 12–46)
Lymphs Abs: 1.5 10*3/uL (ref 0.7–4.0)
MCH: 27.2 pg (ref 26.0–34.0)
MCHC: 31.8 g/dL (ref 30.0–36.0)
MCV: 85.4 fL (ref 78.0–100.0)
MONO ABS: 1 10*3/uL (ref 0.1–1.0)
MONOS PCT: 9 % (ref 3–12)
NEUTROS ABS: 7.9 10*3/uL — AB (ref 1.7–7.7)
Neutrophils Relative %: 74 % (ref 43–77)
Platelets: 477 10*3/uL — ABNORMAL HIGH (ref 150–400)
RBC: 3.57 MIL/uL — AB (ref 4.22–5.81)
RDW: 13.8 % (ref 11.5–15.5)
WBC: 10.6 10*3/uL — ABNORMAL HIGH (ref 4.0–10.5)

## 2015-05-26 LAB — CREATININE, SERUM: CREATININE: 0.79 mg/dL (ref 0.61–1.24)

## 2015-05-26 LAB — BUN: BUN: 15 mg/dL (ref 6–20)

## 2015-05-26 LAB — VANCOMYCIN, TROUGH: VANCOMYCIN TR: 12 ug/mL (ref 10.0–20.0)

## 2015-05-28 ENCOUNTER — Other Ambulatory Visit: Payer: Self-pay | Admitting: Radiology

## 2015-05-28 ENCOUNTER — Other Ambulatory Visit: Payer: Self-pay | Admitting: Surgery

## 2015-05-28 ENCOUNTER — Other Ambulatory Visit (HOSPITAL_COMMUNITY): Payer: Self-pay | Admitting: Interventional Radiology

## 2015-05-28 ENCOUNTER — Ambulatory Visit: Payer: Medicaid Other | Attending: Internal Medicine | Admitting: Internal Medicine

## 2015-05-28 ENCOUNTER — Other Ambulatory Visit (HOSPITAL_COMMUNITY): Payer: Self-pay | Admitting: Surgery

## 2015-05-28 ENCOUNTER — Encounter: Payer: Self-pay | Admitting: Internal Medicine

## 2015-05-28 VITALS — BP 132/84 | HR 110 | Temp 97.4°F | Resp 18 | Ht 72.0 in | Wt 173.8 lb

## 2015-05-28 DIAGNOSIS — R7989 Other specified abnormal findings of blood chemistry: Secondary | ICD-10-CM | POA: Insufficient documentation

## 2015-05-28 DIAGNOSIS — R52 Pain, unspecified: Secondary | ICD-10-CM

## 2015-05-28 DIAGNOSIS — G8929 Other chronic pain: Secondary | ICD-10-CM | POA: Insufficient documentation

## 2015-05-28 DIAGNOSIS — F17210 Nicotine dependence, cigarettes, uncomplicated: Secondary | ICD-10-CM | POA: Insufficient documentation

## 2015-05-28 DIAGNOSIS — K6812 Psoas muscle abscess: Secondary | ICD-10-CM | POA: Insufficient documentation

## 2015-05-28 DIAGNOSIS — Z79899 Other long term (current) drug therapy: Secondary | ICD-10-CM | POA: Diagnosis not present

## 2015-05-28 DIAGNOSIS — R945 Abnormal results of liver function studies: Secondary | ICD-10-CM

## 2015-05-28 DIAGNOSIS — M545 Low back pain: Secondary | ICD-10-CM | POA: Diagnosis not present

## 2015-05-28 DIAGNOSIS — M549 Dorsalgia, unspecified: Secondary | ICD-10-CM | POA: Diagnosis present

## 2015-05-28 LAB — HEPATITIS B SURFACE ANTIBODY,QUALITATIVE: HEP B S AB: NEGATIVE

## 2015-05-28 LAB — HEPATITIS B SURFACE ANTIGEN: Hepatitis B Surface Ag: NEGATIVE

## 2015-05-28 MED ORDER — KETOROLAC TROMETHAMINE 60 MG/2ML IM SOLN
60.0000 mg | Freq: Once | INTRAMUSCULAR | Status: AC
  Administered 2015-05-28: 60 mg via INTRAMUSCULAR

## 2015-05-28 MED ORDER — GABAPENTIN 300 MG PO CAPS: 300.0000 mg | ORAL_CAPSULE | Freq: Three times a day (TID) | ORAL | Status: DC

## 2015-05-28 MED ORDER — ACETAMINOPHEN-CODEINE #3 300-30 MG PO TABS: 1.0000 | ORAL_TABLET | Freq: Two times a day (BID) | ORAL | Status: DC | PRN

## 2015-05-28 NOTE — Progress Notes (Signed)
Patient Demographics  [name], is a 39 y.o. male  ZOX:[id number]:[id number]  [id number]:[id number]  DOB - [birth year]  CC:  Chief Complaint  Patient presents with  . Back Pain  . Medication Refill       HPI: [name] is a 39 y.o. male here today to establish medical care.Patient was recently hospitalized with left-sided back pain leg pain, EMR reviewed her patient had CT abdomen done which revealed a source abscess, surgery, infectious disease, interventional radiology on board, had percutaneous drain placement PICC line placed is currently on vancomycin, was advised to follow up with ID and interventional radiology, his blood work also showed abnormal liver function test, patient denies drinking alcohol denies any illicit drug use has been having lot of pain, apparently was prescribed oxycodone as well as tramadol he is still requesting more pain medication, I have told the patient that we don't prescribe strong narcotic medication in our office but he should use the medications cautiously because of addictive potential, he denies any urinary symptoms denies any change in bowel habits, has a home nurse and is getting IV antibiotics Patient has No headache, No chest pain, No abdominal pain - No Nausea, No new weakness tingling or numbness, No Cough - SOB.  Allergies  Allergen Reactions  . Vicodin [Hydrocodone-Acetaminophen] Hives and Itching  . Adhesive [Tape]    Past Medical History  Diagnosis Date  . H/O back injury   . MRSA (methicillin resistant Staphylococcus aureus)    Current Outpatient Prescriptions on File Prior to Visit  Medication Sig Dispense Refill  . diphenhydramine-acetaminophen (TYLENOL PM) 25-500 MG TABS Take 1 tablet by mouth at bedtime as needed.    . methocarbamol (ROBAXIN) 500 MG tablet Take 1 tablet (500 mg total) by mouth every 8 (eight) hours as needed for muscle spasms. 60 tablet 0  . traMADol (ULTRAM) 50 MG tablet Take 2 tablets (100 mg total) by mouth every 6  (six) hours. 60 tablet 0  . vancomycin 1,500 mg in sodium chloride 0.9 % 500 mL Inject 1,500 mg into the vein every 12 (twelve) hours. For 3 more weeks 42 each 0  . docusate sodium (COLACE) 100 MG capsule Take 1 capsule (100 mg total) by mouth 2 (two) times daily. (Patient not taking: Reported on 05/28/2015) 60 capsule 0  . oxyCODONE 10 MG TABS Take 1-2 tablets (10-20 mg total) by mouth every 4 (four) hours as needed (10mg  for moderate pain, 20mg  for severe pain). (Patient not taking: Reported on 05/28/2015) 90 tablet 0  . polyethylene glycol (MIRALAX / GLYCOLAX) packet Take 17 g by mouth 2 (two) times daily. May change to as needed once with regular bowel movements. (Patient not taking: Reported on 05/28/2015) 60 each 0  . senna (SENOKOT) 8.6 MG TABS tablet Take 1 tablet (8.6 mg total) by mouth at bedtime. (Patient not taking: Reported on 05/28/2015) 30 each 0   No current facility-administered medications on file prior to visit.   Family History  Problem Relation Age of Onset  . Cancer Father    History   Social History  . Marital Status: Married    Spouse Name: N/A  . Number of Children: N/A  . Years of Education: N/A   Occupational History  . Not on file.   Social History Main Topics  . Smoking status: Heavy Tobacco Smoker -- 0.50 packs/day for 20 years    Types: Cigarettes  . Smokeless tobacco: Never Used  . Alcohol Use: No  . Drug  Use: No     Comment: quit marijuana- 3-4 months ago  . Sexual Activity: Yes    Birth Control/ Protection: None   Other Topics Concern  . Not on file   Social History Narrative    Review of Systems: Constitutional: Negative for fever, chills, diaphoresis, activity change, appetite change and fatigue. HENT: Negative for ear pain, nosebleeds, congestion, facial swelling, rhinorrhea, neck pain, neck stiffness and ear discharge.  Eyes: Negative for pain, discharge, redness, itching and visual disturbance. Respiratory: Negative for cough, choking, chest  tightness, shortness of breath, wheezing and stridor.  Cardiovascular: Negative for chest pain, palpitations and leg swelling. Gastrointestinal: Negative for abdominal distention. Genitourinary: Negative for dysuria, urgency, frequency, hematuria, flank pain, decreased urine volume, difficulty urinating and dyspareunia.  Musculoskeletal: Negative for back pain, joint swelling, arthralgia and gait problem. Neurological: Negative for dizziness, tremors, seizures, syncope, facial asymmetry, speech difficulty, weakness, light-headedness, numbness and headaches.  Hematological: Negative for adenopathy. Does not bruise/bleed easily. Psychiatric/Behavioral: Negative for hallucinations, behavioral problems, confusion, dysphoric mood, decreased concentration and agitation.    Objective:   Filed Vitals:   05/28/15 1218  BP: 132/84  Pulse: 110  Temp: 97.4 F (36.3 C)  Resp: 18    Physical Exam: Constitutional: Patient appears well-developed and well-nourished. No distress. HENT: Normocephalic, atraumatic, External right and left ear normal. Oropharynx is clear and moist.  Eyes: Conjunctivae and EOM are normal. PERRLA, no scleral icterus. Neck: Normal ROM. Neck supple. No JVD. No tracheal deviation. No thyromegaly. CVS: RRR, S1/S2 +, no murmurs, no gallops, no carotid bruit.  Pulmonary: Effort and breath sounds normal, no stridor, rhonchi, wheezes, rales.  Abdominal: Soft. BS +, no distension, 2 drains  on the left lower abdomen. PICC line in left arm. Musculoskeletal: Normal range of motion. Some lower left lumbar paraspinal tenderness Neuro: Alert. Normal reflexes, muscle tone coordination. No cranial nerve deficit. Skin: Skin is warm and dry. No rash noted. Not diaphoretic. No erythema. No pallor. Psychiatric: Normal mood and affect. Behavior, judgment, thought content normal.  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 - Ambulatory referral to Infectious Disease  2. Pain  - acetaminophen-codeine (TYLENOL #3) 300-30 MG per tablet; Take 1 tablet by mouth every 12 (twelve) hours as needed for moderate pain.  Dispense: 60 tablet; Refill: 0 - gabapentin (NEURONTIN) 300 MG capsule; Take 1 capsule (300 mg total) by mouth 3 (three) times daily.  Dispense: 90 capsule; Refill: 3  3. Chronic low back pain  - acetaminophen-codeine (TYLENOL #3) 300-30 MG per tablet; Take 1 tablet by mouth every 12 (twelve) hours as needed for moderate pain.  Dispense: 60 tablet; Refill: 0 - gabapentin (NEURONTIN) 300 MG capsule; Take 1 capsule (300 mg total) by mouth 3 (three) times daily.  Dispense: 90 capsule; Refill: 3 - ketorolac (TORADOL) injection 60 mg; Inject 2 mLs (60 mg total) into the muscle  once.  4. Abnormal LFTs  - Hepatitis C RNA quantitative - Hepatitis B surface antibody - Hepatitis B surface antigen   Return in about 3 months (around 08/28/2015), or if symptoms worsen or fail to improve.    The patient was given clear instructions to go to ER or return to medical center if symptoms don't improve, worsen or new problems develop. The patient verbalized understanding. The patient was told to call to get lab results if they haven't heard anything in the next week.    This note has been created with Education officer, environmental. Any transcriptional errors are unintentional.   Doris Cheadle, MD

## 2015-05-28 NOTE — Progress Notes (Signed)
Patient here for hospital follow up for back infection.  Patient has two tubes in lower left abdomen that drain into collection bag.  Patient also has PICC line in left arm.  Patient states he is in pain where the tubes are inserted and in his lower left back/buttocks area that radiates down left leg.  Pain 10/10, constant, throbbing "like a tooth ache."  Patient requesting refill on pain medication.  Patient was informed that [facility] only prescribes Tramadol and Tylenol #3.  Patient states Tramadol isn't strong enough, and unsure where to get refill of stronger pain medication.

## 2015-05-29 NOTE — Progress Notes (Signed)
Telephone Call  Patient's home health nurse called and reported some leakage of fluid from around drain site during flushes.  Patient doing ok otherwise.  I have instructed nurse to have the patient stop the flushes.  He has an appointment in place in drain clinic on the 12th.  He understands to keep this appointment.  Corrin ParkerWendy Tamlyn Sides, PA-C

## 2015-06-02 ENCOUNTER — Emergency Department (HOSPITAL_COMMUNITY)
Admission: EM | Admit: 2015-06-02 | Discharge: 2015-06-02 | Disposition: A | Discharge status: Home / Self Care | Payer: Medicaid Other | Attending: Emergency Medicine | Admitting: Emergency Medicine

## 2015-06-02 ENCOUNTER — Emergency Department (HOSPITAL_COMMUNITY): Payer: Medicaid Other

## 2015-06-02 ENCOUNTER — Telehealth: Payer: Self-pay

## 2015-06-02 ENCOUNTER — Encounter (HOSPITAL_COMMUNITY): Payer: Self-pay | Admitting: *Deleted

## 2015-06-02 ENCOUNTER — Telehealth: Payer: Self-pay | Admitting: Internal Medicine

## 2015-06-02 DIAGNOSIS — M549 Dorsalgia, unspecified: Secondary | ICD-10-CM | POA: Diagnosis not present

## 2015-06-02 DIAGNOSIS — R112 Nausea with vomiting, unspecified: Secondary | ICD-10-CM | POA: Diagnosis not present

## 2015-06-02 DIAGNOSIS — Z8614 Personal history of Methicillin resistant Staphylococcus aureus infection: Secondary | ICD-10-CM | POA: Diagnosis not present

## 2015-06-02 DIAGNOSIS — Z720 Tobacco use: Secondary | ICD-10-CM | POA: Insufficient documentation

## 2015-06-02 DIAGNOSIS — K6812 Psoas muscle abscess: Secondary | ICD-10-CM | POA: Diagnosis not present

## 2015-06-02 DIAGNOSIS — Z87828 Personal history of other (healed) physical injury and trauma: Secondary | ICD-10-CM | POA: Diagnosis not present

## 2015-06-02 DIAGNOSIS — M869 Osteomyelitis, unspecified: Secondary | ICD-10-CM | POA: Diagnosis not present

## 2015-06-02 DIAGNOSIS — R1032 Left lower quadrant pain: Secondary | ICD-10-CM | POA: Diagnosis present

## 2015-06-02 DIAGNOSIS — Z79899 Other long term (current) drug therapy: Secondary | ICD-10-CM | POA: Diagnosis not present

## 2015-06-02 HISTORY — DX: Psoas muscle abscess: K68.12

## 2015-06-02 HISTORY — DX: Bacteremia: R78.81

## 2015-06-02 LAB — CBC WITH DIFFERENTIAL/PLATELET
BASOS ABS: 0.1 10*3/uL (ref 0.0–0.1)
Basophils Relative: 1 % (ref 0–1)
Eosinophils Absolute: 0 10*3/uL (ref 0.0–0.7)
Eosinophils Relative: 1 % (ref 0–5)
HCT: 31.6 % — ABNORMAL LOW (ref 39.0–52.0)
HEMOGLOBIN: 10.1 g/dL — AB (ref 13.0–17.0)
Lymphocytes Relative: 18 % (ref 12–46)
Lymphs Abs: 1.6 10*3/uL (ref 0.7–4.0)
MCH: 27 pg (ref 26.0–34.0)
MCHC: 32 g/dL (ref 30.0–36.0)
MCV: 84.5 fL (ref 78.0–100.0)
Monocytes Absolute: 0.4 10*3/uL (ref 0.1–1.0)
Monocytes Relative: 5 % (ref 3–12)
NEUTROS ABS: 6.6 10*3/uL (ref 1.7–7.7)
Neutrophils Relative %: 75 % (ref 43–77)
PLATELETS: 310 10*3/uL (ref 150–400)
RBC: 3.74 MIL/uL — AB (ref 4.22–5.81)
RDW: 14 % (ref 11.5–15.5)
WBC: 8.7 10*3/uL (ref 4.0–10.5)

## 2015-06-02 LAB — COMPREHENSIVE METABOLIC PANEL
ALBUMIN: 2.7 g/dL — AB (ref 3.5–5.0)
ALT: 39 U/L (ref 17–63)
ANION GAP: 7 (ref 5–15)
AST: 25 U/L (ref 15–41)
Alkaline Phosphatase: 293 U/L — ABNORMAL HIGH (ref 38–126)
BILIRUBIN TOTAL: 0.5 mg/dL (ref 0.3–1.2)
BUN: 15 mg/dL (ref 6–20)
CO2: 25 mmol/L (ref 22–32)
CREATININE: 0.98 mg/dL (ref 0.61–1.24)
Calcium: 8.5 mg/dL — ABNORMAL LOW (ref 8.9–10.3)
Chloride: 112 mmol/L — ABNORMAL HIGH (ref 101–111)
GFR calc Af Amer: >60 mL/min (ref >60)
GLUCOSE: 107 mg/dL — AB (ref 65–99)
POTASSIUM: 4 mmol/L (ref 3.5–5.1)
Sodium: 144 mmol/L (ref 135–145)
Total Protein: 7.5 g/dL (ref 6.5–8.1)

## 2015-06-02 MED ORDER — IOHEXOL 300 MG/ML  SOLN
50.0000 mL | Freq: Once | INTRAMUSCULAR | Status: AC | PRN
  Administered 2015-06-02: 50 mL via ORAL

## 2015-06-02 MED ORDER — IOHEXOL 300 MG/ML  SOLN
100.0000 mL | Freq: Once | INTRAMUSCULAR | Status: AC | PRN
  Administered 2015-06-02: 100 mL via INTRAVENOUS

## 2015-06-02 MED ORDER — GADOBENATE DIMEGLUMINE 529 MG/ML IV SOLN
15.0000 mL | Freq: Once | INTRAVENOUS | Status: AC | PRN
  Administered 2015-06-02: 15 mL via INTRAVENOUS

## 2015-06-02 MED ORDER — ONDANSETRON HCL 4 MG/2ML IJ SOLN
4.0000 mg | Freq: Once | INTRAMUSCULAR | Status: AC
  Administered 2015-06-02: 4 mg via INTRAVENOUS
  Filled 2015-06-02: qty 2

## 2015-06-02 MED ORDER — OXYCODONE HCL 10 MG PO TABS: 10.0000 mg | ORAL_TABLET | ORAL | Status: DC | PRN

## 2015-06-02 MED ORDER — HYDROMORPHONE HCL 1 MG/ML IJ SOLN
INTRAMUSCULAR | Status: AC
  Administered 2015-06-02: 1 mg
  Filled 2015-06-02: qty 1

## 2015-06-02 MED ORDER — SODIUM CHLORIDE 0.9 % IV BOLUS (SEPSIS)
1000.0000 mL | Freq: Once | INTRAVENOUS | Status: AC
  Administered 2015-06-02: 1000 mL via INTRAVENOUS

## 2015-06-02 MED ORDER — HYDROMORPHONE HCL 2 MG/ML IJ SOLN
INTRAMUSCULAR | Status: AC
  Filled 2015-06-02: qty 1

## 2015-06-02 MED ORDER — HYDROMORPHONE HCL 1 MG/ML IJ SOLN
1.0000 mg | Freq: Once | INTRAMUSCULAR | Status: AC
  Administered 2015-06-02: 1 mg via INTRAVENOUS

## 2015-06-02 MED ORDER — HYDROMORPHONE HCL 1 MG/ML IJ SOLN
1.0000 mg | Freq: Once | INTRAMUSCULAR | Status: AC
  Administered 2015-06-02: 1 mg via INTRAVENOUS
  Filled 2015-06-02: qty 1

## 2015-06-02 NOTE — ED Notes (Addendum)
Patient has abdominal JP drains draining prior abcesses.  Reports pain at insertion sites since Friday. Denies fevers or increased n/v. Patient has been on Vancomycin since 05/15/15.

## 2015-06-02 NOTE — ED Provider Notes (Signed)
CSN: [id number]     Arrival date & time 06/02/15  1358 History   First MD Initiated Contact with Patient 06/02/15 1523     Chief Complaint  Patient presents with  . JP drain pain      (Consider location/radiation/quality/duration/timing/severity/associated sxs/prior Treatment) HPI Comments: Patient c/o pain at site of drains in psoas abscess. He complains of pain to his left lower abdomen, left back and buttock. This is been constant pain since June 7. He reports he was taking oxycodone for the pain but is since run out. He has 2 JP drains in place that are draining a psoas abscess. Output and 25 mL of fluid per day. Not been leaking. He denies any fever. Had one episode of vomiting 2 days ago. The pain radiates down his left leg and buttock. He is on IV vancomycin through a PICC line. He states compliance. He denies any IV drug use. He does not know how he got this infection. Reports the pain is the same but uncontrolled which is his reason for coming to the hospital today. He denies any bowel or bladder incontinence. He denies any focal weakness, numbness or tingling. He has an appointment tomorrow with interventional radiology to evaluate his drains.  The history is provided by the patient and a relative.    Past Medical History  Diagnosis Date  . H/O back injury   . MRSA (methicillin resistant Staphylococcus aureus)   . 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 MRSA bacteremia 04/2015   Past Surgical History  Procedure Laterality Date  . Skin biopsy    . Abdmoninal drain insertion     Family History  Problem Relation Age of Onset  . Cancer Father    History  Substance Use Topics  . Smoking status: Current Every Day Smoker -- 0.50 packs/day for 20 years    Types: Cigarettes  . Smokeless tobacco: Never Used  . Alcohol Use: No    Review of Systems  Constitutional: Positive for activity change and appetite change. Negative for fever.  HENT: Negative for congestion and rhinorrhea.    Respiratory: Negative for cough, chest tightness and shortness of breath.   Cardiovascular: Negative for chest pain.  Gastrointestinal: Positive for nausea, vomiting and abdominal pain.  Genitourinary: Negative for dysuria and hematuria.  Musculoskeletal: Positive for myalgias, arthralgias and gait problem.  Skin: Negative for rash.  Neurological: Negative for dizziness, weakness and light-headedness.  A complete 10 system review of systems was obtained and all systems are negative except as noted in the HPI and PMH.      Allergies  Vicodin and Adhesive  Home Medications   Prior to Admission medications   Medication Sig Start Date End Date Taking? Authorizing Provider  diphenhydramine-acetaminophen (TYLENOL PM) 25-500 MG TABS Take 1 tablet by mouth at bedtime as needed (SLEEP).    Yes Historical Provider, MD  gabapentin (NEURONTIN) 300 MG capsule Take 1 capsule (300 mg total) by mouth 3 (three) times daily. 05/28/15  Yes Doris Cheadle, MD  methocarbamol (ROBAXIN) 500 MG tablet Take 1 tablet (500 mg total) by mouth every 8 (eight) hours as needed for muscle spasms. 05/23/15  Yes Osvaldo Shipper, MD  vancomycin 1,500 mg in sodium chloride 0.9 % 500 mL Inject 1,500 mg into the vein every 12 (twelve) hours. For 3 more weeks 05/23/15  Yes Osvaldo Shipper, MD  acetaminophen-codeine (TYLENOL #3) 300-30 MG per tablet Take 1 tablet by mouth every 12 (twelve) hours as needed for moderate pain.  Patient not taking: Reported on 06/02/2015 05/28/15   Doris Cheadle, MD  docusate sodium (COLACE) 100 MG capsule Take 1 capsule (100 mg total) by mouth 2 (two) times daily. Patient not taking: Reported on 05/28/2015 05/23/15   Osvaldo Shipper, MD  oxyCODONE 10 MG TABS Take 1-2 tablets (10-20 mg total) by mouth every 4 (four) hours as needed (  for moderate pain,  for severe pain). Patient not taking: Reported on 05/28/2015 05/23/15   Osvaldo Shipper, MD  polyethylene glycol (MIRALAX / GLYCOLAX) packet Take 17 g by mouth  2 (two) times daily. May change to as needed once with regular bowel movements. Patient not taking: Reported on 05/28/2015 05/23/15   Osvaldo Shipper, MD  senna (SENOKOT) 8.6 MG TABS tablet Take 1 tablet (8.6 mg total) by mouth at bedtime. Patient not taking: Reported on 05/28/2015 05/23/15   Osvaldo Shipper, MD  traMADol (ULTRAM) 50 MG tablet Take 2 tablets (100 mg total) by mouth every 6 (six) hours. Patient not taking: Reported on 06/02/2015 05/23/15   Osvaldo Shipper, MD   BP 143/108 mmHg  Pulse 78  Temp(Src) 98.3 F (36.8 C) (Oral)  Resp 17  Ht 6' (1.829 m)  Wt 174 lb (78.926 kg)  BMI 23.59 kg/m2  SpO2 100% Physical Exam  Constitutional: He is oriented to person, place, and time. He appears well-developed and well-nourished. No distress.  HENT:  Head: Normocephalic and atraumatic.  Mouth/Throat: Oropharynx is clear and moist. No oropharyngeal exudate.  Eyes: Conjunctivae and EOM are normal. Pupils are equal, round, and reactive to light.  Neck: Normal range of motion. Neck supple.  No meningismus.  Cardiovascular: Normal rate, regular rhythm, normal heart sounds and intact distal pulses.   No murmur heard. Pulmonary/Chest: Effort normal and breath sounds normal. No respiratory distress.  Abdominal: Soft. There is tenderness. There is no rebound and no guarding.  Tenderness to left upper and lower abdomen. 2 JP drains in place with purulent and sanguinous drainage. Mild erythema surrounding exit sites.  Musculoskeletal: Normal range of motion. He exhibits tenderness. He exhibits no edema.  Paraspinal lumbar tenderness  5/5 strength in bilateral lower extremities. Ankle plantar and dorsiflexion intact. Great toe extension intact bilaterally. +2 DP and PT pulses. +2 patellar reflexes bilaterally. Normal gait.   Neurological: He is alert and oriented to person, place, and time. No cranial nerve deficit. He exhibits normal muscle tone. Coordination normal.  No ataxia on finger to nose  bilaterally. No pronator drift. 5/5 strength throughout. CN 2-12 intact. Negative Romberg. Equal grip strength. Sensation intact. Gait is normal.   Skin: Skin is warm.  Psychiatric: He has a normal mood and affect. His behavior is normal.  Nursing note and vitals reviewed.   ED Course  Procedures (including critical care time) Labs Review Labs Reviewed  CBC WITH DIFFERENTIAL/PLATELET - Abnormal; Notable for the following:    RBC 3.74 (*)    Hemoglobin 10.1 (*)    HCT 31.6 (*)    All other components within normal limits  COMPREHENSIVE METABOLIC PANEL - Abnormal; Notable for the following:    Chloride 112 (*)    Glucose, Bld 107 (*)    Calcium 8.5 (*)    Albumin 2.7 (*)    Alkaline Phosphatase 293 (*)    All other components within normal limits  CULTURE, BLOOD (ROUTINE X 2)  CULTURE, BLOOD (ROUTINE X 2)    Imaging Review Mr Lumbar Spine W Wo Contrast  06/02/2015   CLINICAL DATA:  39 year old male with severe  lumbar back pain radiating to the left hip and lower extremity. Recent left iliopsoas abscess and pelvic abscess drainage. Subsequent encounter.  EXAM: MRI LUMBAR SPINE WITHOUT AND WITH CONTRAST  TECHNIQUE: Multiplanar and multiecho pulse sequences of the lumbar spine were obtained without and with intravenous contrast.  CONTRAST:  15mL MULTIHANCE GADOBENATE DIMEGLUMINE 529 MG/ML IV SOLN  COMPARISON:  CT Abdomen and Pelvis 05/22/2015. Lumbar MRI 05/15/2015.  FINDINGS: Same numbering system as on 05/15/2015 designating normal lumbar segmentation. Stable lumbar and visualized lower thoracic vertebral height and alignment. No lumbar or lower thoracic marrow edema identified.  T12-L1 disc space loss with circumferential disc bulge appears stable. Mild facet hypertrophy also at that level. Up to mild multifactorial spinal and T11 foraminal stenosis appears stable. No signal abnormality identified in the adjacent lower thoracic spinal cord. The conus medullaris appears normal at L1.  The  L1-L2 through L3-L4 disc spaces remain within normal limits.  Resolved posterior L4-L5 and L5-S1 epidural phlegmon/ inflammation. Improved thecal sac patency at both levels. Underlying L4-L5 and L5-S1 disc and facet degeneration appears stable.  Abnormal T2 and STIR signal surrounding the left SI joint and involving the left sacral ala, medial left iliac bone, an the surrounding anterior and posterior soft tissues including the visible iliopsoas muscle and left gluteal muscles, persists but appears mildly regressed since June. Associated abnormal postcontrast enhancement of these bony and soft tissues. Trace presacral fluid today. Percutaneous appears stable in position. Left iliopsoas drainage catheter re- identified. Minimal fluid surrounding the catheter. 2-3 cm cephalad of the catheter there is a small 14 mm iliopsoas fluid collection (series 5, image 33).  Visualized abdominal soft tissues are within normal limits.  IMPRESSION: 1. Resolved dorsal lower lumbar epidural phlegmon since 05/15/2015. No acute findings in the lumbar spine. 2. 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 3. Percutaneous left iliopsoas drainage catheter in place with minimal fluid adjacent to the catheter, and small 14 mm fluid collection a short distance cephalad to the catheter.   Electronically Signed   By: Odessa FlemingH  Hall M.D.   On: 06/02/2015 18:51   Ct Abdomen Pelvis W Contrast  06/02/2015   CLINICAL DATA:  39 year old male with history of iliopsoas abscess, s/p JP drain placement. Pain at site of insertion since 05/30/2015. History of MRSA.  EXAM: CT ABDOMEN AND PELVIS WITH CONTRAST  TECHNIQUE: Multidetector CT imaging of the abdomen and pelvis was performed using the standard protocol following bolus administration of intravenous contrast.  CONTRAST:  50mL OMNIPAQUE IOHEXOL 300 MG/ML SOLN, 100mL OMNIPAQUE IOHEXOL  300 MG/ML SOLN  COMPARISON:  CT of the abdomen and pelvis 05/22/2015.  FINDINGS: Lower chest:  Unremarkable.  Hepatobiliary: No cystic or solid hepatic lesions. No intra or extrahepatic biliary ductal dilatation. Gallbladder is normal in appearance.  Pancreas: No pancreatic mass. No pancreatic ductal dilatation. No pancreatic or peripancreatic fluid or inflammatory changes.  Spleen: Multiple small calcified granulomas in the spleen.  Adrenals/Urinary Tract: Bilateral adrenal glands and bilateral kidneys are normal in appearance. No hydroureteronephrosis or perinephric stranding to indicate urinary tract obstruction at this time. Urinary bladder is normal in appearance.  Stomach/Bowel: Stomach is normal in appearance. No pathologic dilatation of small bowel or colon. Normal appendix.  Vascular/Lymphatic: Atherosclerosis throughout the abdominal and pelvic vasculature, without evidence of aneurysm or dissection. Numerous prominent but nonenlarged retroperitoneal lymph nodes are noted, which are nonspecific, likely reactive.  Reproductive: Prostate gland and  seminal vesicles are unremarkable in appearance.  Other: Again noted are 2 pigtail drainage catheters in place in the left iliopsoas musculature, and along the left pelvic sidewall. Previously noted fluid collections in these regions appear to have completely resolved. There is a tiny focus of low attenuation in the superior aspect of the left iliacus muscle (image 45 of series 7), which measures approximately 8 mm in greatest length, slightly smaller than the prior examination, which could represent an additional small abscess, or may represent an intramuscular seroma or resolving chronic hematoma. No significant volume of ascites. No pneumoperitoneum.  Musculoskeletal: There are no aggressive appearing lytic or blastic lesions noted in the visualized portions of the skeleton.  IMPRESSION: 1. Left-sided pigtail drainage catheters remain in position in the left  iliopsoas musculature and along the left pelvic sidewall, with essentially complete resolution of previously noted abscesses. Separate from the drained collections there is an 8 mm focus in the cephalad aspect of the left iliacus musculature immediately lateral to the superior aspect of the left sacroiliac joint, which may represent an additional small abscess (if so, this is smaller than the prior study), or a resolving intramuscular seroma or chronic hematoma. 2. Additional incidental findings, as above.   Electronically Signed   By: Trudie Reed M.D.   On: 06/02/2015 19:12     EKG Interpretation None      MDM   Final diagnoses:  Back pain   Patient with worsening pain from his iliopsoas abscess. Drains are working appropriately. He is afebrile. He has no peritoneal signs. Labwork appears to be at baseline. We'll obtain CT imaging to rule out reaccumulation of abscess.  Review of records shows that MRI June 23 showed an epidural fluid collection concerning for phlegmon. This does not appear to have been addressed with neurosurgery. Discussed with Dr. Lovell Sheehan. He requests repeat MRI and he will review the images. He states it's hard for him to believe that neurosurgery was not consulted but there is no record of this.  MRI shows resolution of patient's lumbar epidural phlegmon. Discussed with Dr. Lovell Sheehan who viewed images. He states nothing to do from neurosurgical standpoint. Patient has sacroiliitis and possible septic sacroiliac joint which persists. CT scan shows pigtail drainage catheters in appropriate position with resolution of previously noted abscesses. There is a 8 mm focus which may represent abscess versus seroma versus hematoma.  Case discussed with Dr. Orvan Falconer. He states this is expected to have persistent sacroiliitis and osteomyelitis. He recommends to continue vancomycin as prescribed. He states radiological results, lag behind clinical improvement. Patient scheduled to  see Dr. Orvan Falconer on July 26. He states nothing additional to do at this point.  Patient was prescribed T3 by wellness center which he didn't fill because "it doesn't do anything" review of drug database shows patient was prescribed 90 oxycodone 10 mg on July 1.  He will be given a short course of medications because he is in significant pain. He has a legitimate reason for using pain medication at this time. He will follow-up tomorrow as scheduled with IR. Return precautions discussed.  Glynn Octave, MD 06/03/15 [phone]

## 2015-06-02 NOTE — Discharge Instructions (Signed)
Back Pain, Adult Follow-up for your appointment tomorrow as scheduled. Take the pain medication as prescribed. Follow-up with Dr. Orvan Falconer on July 26. Return to the ED if you develop new or worsening symptoms. Low back pain is very common. About 1 in 5 people have back pain.The cause of low back pain is rarely dangerous. The pain often gets better over time.About half of people with a sudden onset of back pain feel better in just 2 weeks. About 8 in 10 people feel better by 6 weeks.  CAUSES Some common causes of back pain include:  Strain of the muscles or ligaments supporting the spine.  Wear and tear (degeneration) of the spinal discs.  Arthritis.  Direct injury to the back. DIAGNOSIS Most of the time, the direct cause of low back pain is not known.However, back pain can be treated effectively even when the exact cause of the pain is unknown.Answering your caregiver's questions about your overall health and symptoms is one of the most accurate ways to make sure the cause of your pain is not dangerous. If your caregiver needs more information, he or she may order lab work or imaging tests (X-rays or MRIs).However, even if imaging tests show changes in your back, this usually does not require surgery. HOME CARE INSTRUCTIONS For many people, back pain returns.Since low back pain is rarely dangerous, it is often a condition that people can learn to [facility] their own.   Remain active. It is stressful on the back to sit or stand in one place. Do not sit, drive, or stand in one place for more than 30 minutes at a time. Take short walks on level surfaces as soon as pain allows.Try to increase the length of time you walk each day.  Do not stay in bed.Resting more than 1 or 2 days can delay your recovery.  Do not avoid exercise or work.Your body is made to move.It is not dangerous to be active, even though your back may hurt.Your back will likely heal faster if you return to being active  before your pain is gone.  Pay attention to your body when you bend and lift. Many people have less discomfortwhen lifting if they bend their knees, keep the load close to their bodies,and avoid twisting. Often, the most comfortable positions are those that put less stress on your recovering back.  Find a comfortable position to sleep. Use a firm mattress and lie on your side with your knees slightly bent. If you lie on your back, put a pillow under your knees.  Only take over-the-counter or prescription medicines as directed by your caregiver. Over-the-counter medicines to reduce pain and inflammation are often the most helpful.Your caregiver may prescribe muscle relaxant drugs.These medicines help dull your pain so you can more quickly return to your normal activities and healthy exercise.  Put ice on the injured area.  Put ice in a plastic bag.  Place a towel between your skin and the bag.  Leave the ice on for 15-20 minutes, 03-04 times a day for the first 2 to 3 days. After that, ice and heat may be alternated to reduce pain and spasms.  Ask your caregiver about trying back exercises and gentle massage. This may be of some benefit.  Avoid feeling anxious or stressed.Stress increases muscle tension and can worsen back pain.It is important to recognize when you are anxious or stressed and learn ways to manage it.Exercise is a great option. SEEK MEDICAL CARE IF:  You have pain that  is not relieved with rest or medicine.  You have pain that does not improve in 1 week.  You have new symptoms.  You are generally not feeling well. SEEK IMMEDIATE MEDICAL CARE IF:   You have pain that radiates from your back into your legs.  You develop new bowel or bladder control problems.  You have unusual weakness or numbness in your arms or legs.  You develop nausea or vomiting.  You develop abdominal pain.  You feel faint. Document Released: 11/08/2005 Document Revised: 05/09/2012  Document Reviewed: 03/12/2014 [facility] Patient Information 2015 [place], [place]. This information is not intended to replace advice given to you by your health care provider. Make sure you discuss any questions you have with your health care provider.

## 2015-06-02 NOTE — Telephone Encounter (Signed)
Patient called to request his blood work results, please f/u with pt.

## 2015-06-03 ENCOUNTER — Ambulatory Visit (HOSPITAL_COMMUNITY): Admission: RE | Admit: 2015-06-03 | Payer: Self-pay | Source: Ambulatory Visit

## 2015-06-03 ENCOUNTER — Ambulatory Visit (HOSPITAL_COMMUNITY): Payer: Self-pay

## 2015-06-03 LAB — HEPATITIS C RNA QUANTITATIVE

## 2015-06-05 ENCOUNTER — Other Ambulatory Visit (HOSPITAL_COMMUNITY)
Admission: RE | Admit: 2015-06-05 | Discharge: 2015-06-05 | Disposition: A | Discharge status: Home / Self Care | Payer: Medicaid Other | Source: Ambulatory Visit | Attending: Emergency Medicine | Admitting: Emergency Medicine

## 2015-06-05 DIAGNOSIS — A4902 Methicillin resistant Staphylococcus aureus infection, unspecified site: Secondary | ICD-10-CM | POA: Insufficient documentation

## 2015-06-05 LAB — VANCOMYCIN, TROUGH: Vancomycin Tr: 7 ug/mL — ABNORMAL LOW (ref 10.0–20.0)

## 2015-06-05 LAB — CREATININE, SERUM
Creatinine, Ser: 1.14 mg/dL (ref 0.61–1.24)
GFR calc Af Amer: >60 mL/min (ref >60)

## 2015-06-05 LAB — BUN: BUN: 16 mg/dL (ref 6–20)

## 2015-06-09 ENCOUNTER — Emergency Department (HOSPITAL_COMMUNITY): Payer: Medicaid Other

## 2015-06-09 ENCOUNTER — Emergency Department (HOSPITAL_COMMUNITY)
Admission: EM | Admit: 2015-06-09 | Discharge: 2015-06-10 | Disposition: A | Discharge status: Home / Self Care | Payer: Medicaid Other | Attending: Emergency Medicine | Admitting: Emergency Medicine

## 2015-06-09 ENCOUNTER — Encounter (HOSPITAL_COMMUNITY): Payer: Self-pay | Admitting: *Deleted

## 2015-06-09 DIAGNOSIS — Z8614 Personal history of Methicillin resistant Staphylococcus aureus infection: Secondary | ICD-10-CM | POA: Diagnosis not present

## 2015-06-09 DIAGNOSIS — N390 Urinary tract infection, site not specified: Secondary | ICD-10-CM | POA: Diagnosis not present

## 2015-06-09 DIAGNOSIS — A599 Trichomoniasis, unspecified: Secondary | ICD-10-CM

## 2015-06-09 DIAGNOSIS — R319 Hematuria, unspecified: Secondary | ICD-10-CM | POA: Diagnosis present

## 2015-06-09 DIAGNOSIS — Z8719 Personal history of other diseases of the digestive system: Secondary | ICD-10-CM | POA: Insufficient documentation

## 2015-06-09 DIAGNOSIS — Z720 Tobacco use: Secondary | ICD-10-CM | POA: Diagnosis not present

## 2015-06-09 LAB — COMPREHENSIVE METABOLIC PANEL
ALT: 13 U/L — AB (ref 17–63)
ANION GAP: 12 (ref 5–15)
AST: 12 U/L — ABNORMAL LOW (ref 15–41)
Albumin: 2.5 g/dL — ABNORMAL LOW (ref 3.5–5.0)
Alkaline Phosphatase: 164 U/L — ABNORMAL HIGH (ref 38–126)
BUN: 11 mg/dL (ref 6–20)
CO2: 26 mmol/L (ref 22–32)
Calcium: 8.3 mg/dL — ABNORMAL LOW (ref 8.9–10.3)
Chloride: 101 mmol/L (ref 101–111)
Creatinine, Ser: 1.2 mg/dL (ref 0.61–1.24)
GFR calc Af Amer: >60 mL/min (ref >60)
GFR calc non Af Amer: >60 mL/min (ref >60)
GLUCOSE: 104 mg/dL — AB (ref 65–99)
POTASSIUM: 3.2 mmol/L — AB (ref 3.5–5.1)
Sodium: 139 mmol/L (ref 135–145)
Total Bilirubin: 0.5 mg/dL (ref 0.3–1.2)
Total Protein: 6.5 g/dL (ref 6.5–8.1)

## 2015-06-09 LAB — URINALYSIS, ROUTINE W REFLEX MICROSCOPIC
Glucose, UA: NEGATIVE mg/dL
Ketones, ur: 15 mg/dL — AB
NITRITE: POSITIVE — AB
PH: 5.5 (ref 5.0–8.0)
PROTEIN: 100 mg/dL — AB
Specific Gravity, Urine: 1.022 (ref 1.005–1.030)
Urobilinogen, UA: 1 mg/dL (ref 0.0–1.0)

## 2015-06-09 LAB — CBC
HEMATOCRIT: 25.6 % — AB (ref 39.0–52.0)
Hemoglobin: 8.1 g/dL — ABNORMAL LOW (ref 13.0–17.0)
MCH: 25.7 pg — ABNORMAL LOW (ref 26.0–34.0)
MCHC: 31.6 g/dL (ref 30.0–36.0)
MCV: 81.3 fL (ref 78.0–100.0)
Platelets: 248 10*3/uL (ref 150–400)
RBC: 3.15 MIL/uL — AB (ref 4.22–5.81)
RDW: 14 % (ref 11.5–15.5)
WBC: 8.4 10*3/uL (ref 4.0–10.5)

## 2015-06-09 LAB — CULTURE, BLOOD (ROUTINE X 2)
Culture: NO GROWTH
Culture: NO GROWTH

## 2015-06-09 LAB — TYPE AND SCREEN
ABO/RH(D): O POS
Antibody Screen: NEGATIVE

## 2015-06-09 LAB — URINE MICROSCOPIC-ADD ON

## 2015-06-09 LAB — ABO/RH: ABO/RH(D): O POS

## 2015-06-09 LAB — LIPASE, BLOOD: Lipase: 16 U/L — ABNORMAL LOW (ref 22–51)

## 2015-06-09 MED ORDER — DOXYCYCLINE HYCLATE 100 MG PO TABS
100.0000 mg | ORAL_TABLET | Freq: Once | ORAL | Status: AC
  Administered 2015-06-09: 100 mg via ORAL
  Filled 2015-06-09: qty 1

## 2015-06-09 MED ORDER — IOHEXOL 300 MG/ML  SOLN
25.0000 mL | Freq: Once | INTRAMUSCULAR | Status: AC | PRN
  Administered 2015-06-09: 25 mL via ORAL

## 2015-06-09 MED ORDER — DOXYCYCLINE HYCLATE 100 MG PO CAPS: 100.0000 mg | ORAL_CAPSULE | Freq: Two times a day (BID) | ORAL | Status: DC

## 2015-06-09 MED ORDER — HYDROMORPHONE HCL 1 MG/ML IJ SOLN
1.0000 mg | Freq: Once | INTRAMUSCULAR | Status: AC
  Administered 2015-06-09: 1 mg via INTRAVENOUS
  Filled 2015-06-09: qty 1

## 2015-06-09 MED ORDER — AMOXICILLIN-POT CLAVULANATE 875-125 MG PO TABS: 1.0000 | ORAL_TABLET | Freq: Once | ORAL | Status: DC

## 2015-06-09 MED ORDER — DEXTROSE 5 % IV SOLN
1.0000 g | Freq: Once | INTRAVENOUS | Status: AC
  Administered 2015-06-09: 1 g via INTRAVENOUS
  Filled 2015-06-09: qty 10

## 2015-06-09 MED ORDER — AMOXICILLIN-POT CLAVULANATE 875-125 MG PO TABS: 1.0000 | ORAL_TABLET | Freq: Two times a day (BID) | ORAL | Status: DC

## 2015-06-09 MED ORDER — METRONIDAZOLE 500 MG PO TABS
500.0000 mg | ORAL_TABLET | Freq: Two times a day (BID) | ORAL | Status: DC
Start: 2015-06-09 — End: 2015-06-19

## 2015-06-09 MED ORDER — IOHEXOL 300 MG/ML  SOLN
100.0000 mL | Freq: Once | INTRAMUSCULAR | Status: AC | PRN
  Administered 2015-06-09: 100 mL via INTRAVENOUS

## 2015-06-09 MED ORDER — METRONIDAZOLE 500 MG PO TABS
500.0000 mg | ORAL_TABLET | Freq: Once | ORAL | Status: AC
  Administered 2015-06-09: 500 mg via ORAL
  Filled 2015-06-09: qty 1

## 2015-06-09 MED ORDER — OXYCODONE-ACETAMINOPHEN 5-325 MG PO TABS: 1.0000 | ORAL_TABLET | ORAL | Status: DC | PRN

## 2015-06-09 NOTE — ED Notes (Signed)
Pt states that he has had abdominal pain in his left side. Pt state that he recently had drainage tubes removed. Pt states that he is on antibiotics through his picc line. Pt states that he his also having blood in urine for 2-3 days.

## 2015-06-09 NOTE — ED Notes (Signed)
CT called to verify if PICC line was OK to use for CT.

## 2015-06-09 NOTE — ED Provider Notes (Signed)
CSN: [id number]     Arrival date & time 06/09/15  1658 History   First MD Initiated Contact with Patient 06/09/15 1743     Chief Complaint  Patient presents with  . Abdominal Pain  . Hematuria     (Consider location/radiation/quality/duration/timing/severity/associated sxs/prior Treatment) HPI  Patient reports that he has developed left lower back and gluteal pain since his drains were removed approximately 6 days ago which were draining psoas abscesses. He states he was concerned of the drains were being removed too early as he was still having some drainage from them. He states now he has pain that severe and going down his left leg as well. It's deep and aching in quality. He has not redeveloped a fever. The pain is sometimes improved by flexing at the hip and bending the leg up. He reports that the pain is made worse by lying flat on his back or hip. He also notes that he's had blood in his urine. Past Medical History  Diagnosis Date  . H/O back injury   . MRSA (methicillin resistant Staphylococcus aureus)   . 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 MRSA bacteremia 04/2015   Past Surgical History  Procedure Laterality Date  . Skin biopsy    . Abdmoninal drain insertion     Family History  Problem Relation Age of Onset  . Cancer Father    History  Substance Use Topics  . Smoking status: Current Every Day Smoker -- 0.50 packs/day for 20 years    Types: Cigarettes  . Smokeless tobacco: Never Used  . Alcohol Use: No    Review of Systems 10 Systems reviewed and are negative for acute change except as noted in the HPI.   Allergies  Vicodin and Adhesive  Home Medications   Prior to Admission medications   Medication Sig Start Date End Date Taking? Authorizing Provider  diphenhydramine-acetaminophen (TYLENOL PM) 25-500 MG TABS Take 1 tablet by mouth at bedtime as needed (SLEEP).    Yes Historical Provider, MD  methocarbamol (ROBAXIN) 500 MG tablet Take 1 tablet (500 mg total)  by mouth every 8 (eight) hours as needed for muscle spasms. Patient not taking: Reported on 06/11/2015 05/23/15  Yes Osvaldo ShipperGokul Krishnan, MD  vancomycin 1,500 mg in sodium chloride 0.9 % 500 mL Inject 1,500 mg into the vein every 12 (twelve) hours. For 3 more weeks 05/23/15  Yes Osvaldo ShipperGokul Krishnan, MD  acetaminophen-codeine (TYLENOL #3) 300-30 MG per tablet Take 1 tablet by mouth every 12 (twelve) hours as needed for moderate pain. Patient not taking: Reported on 06/02/2015 05/28/15   Doris Cheadleeepak Advani, MD  amoxicillin-clavulanate (AUGMENTIN) 875-125 MG per tablet Take 1 tablet by mouth 2 (two) times daily. One po bid x 7 days Patient not taking: Reported on 06/11/2015 06/09/15   Arby BarretteMarcy Dalary Hollar, MD  docusate sodium (COLACE) 100 MG capsule Take 1 capsule (100 mg total) by mouth 2 (two) times daily. Patient not taking: Reported on 05/28/2015 05/23/15   Osvaldo ShipperGokul Krishnan, MD  doxycycline (VIBRAMYCIN) 100 MG capsule Take 1 capsule (100 mg total) by mouth 2 (two) times daily. One po bid x 7 days Patient not taking: Reported on 06/11/2015 06/09/15   Arby BarretteMarcy Nilda Keathley, MD  gabapentin (NEURONTIN) 300 MG capsule Take 1 capsule (300 mg total) by mouth 3 (three) times daily. Patient not taking: Reported on 06/09/2015 05/28/15   Doris Cheadleeepak Advani, MD  metroNIDAZOLE (FLAGYL) 500 MG tablet Take 1 tablet (500 mg total) by mouth 2 (two) times daily. One po  bid x 7 days Patient not taking: Reported on 06/11/2015 06/09/15   Arby Barrette, MD  Oxycodone HCl 10 MG TABS Take 1 tablet (10 mg total) by mouth every 4 (four) hours as needed for severe pain. Patient not taking: Reported on 06/11/2015 06/02/15   Glynn Octave, MD  polyethylene glycol [facility]) packet Take 17 g by mouth 2 (two) times daily. May change to as needed once with regular bowel movements. Patient not taking: Reported on 05/28/2015 05/23/15   Osvaldo Shipper, MD  senna (SENOKOT) 8.6 MG TABS tablet Take 1 tablet (8.6 mg total) by mouth at bedtime. Patient not taking: Reported on  05/28/2015 05/23/15   Osvaldo Shipper, MD  traMADol (ULTRAM) 50 MG tablet Take 2 tablets (100 mg total) by mouth every 6 (six) hours. Patient not taking: Reported on 06/02/2015 05/23/15   Osvaldo Shipper, MD   BP 141/90 mmHg  Pulse 84  Temp(Src) 98.6 F (37 C) (Oral)  Resp 14  SpO2 100% Physical Exam  Constitutional: He is oriented to person, place, and time. He appears well-developed and well-nourished.  Patient is nontoxic and alert. No respiratory distress.  HENT:  Head: Normocephalic and atraumatic.  Eyes: Conjunctivae and EOM are normal.  Neck: Neck supple.  Cardiovascular: Normal rate, regular rhythm, normal heart sounds and intact distal pulses.   Pulmonary/Chest: Effort normal and breath sounds normal.  Abdominal: Soft. Bowel sounds are normal. He exhibits no distension. There is no tenderness.  Musculoskeletal: Normal range of motion. He exhibits tenderness. He exhibits no edema.  Patient endorses tenderness to deep palpation in his gluteal region and lower back. There is no palpable abnormality. At this time I cannot appreciate fullness or erythema. He reports pain through the entirety of the left lower extremity. Objectively I do not note significant edema or asymmetry in the legs. Distal pulses are 2+ and symmetric. The patient does have linear elevation and firmness of several vein sites on the left forearm that are suggestive of IVDA however the patient reports that these are secondary to multiple attempts at blood draws by medical providers. None appear secondarily infected or acutely abscess.  Neurological: He is alert and oriented to person, place, and time. He has normal strength. Coordination normal. GCS eye subscore is 4. GCS verbal subscore is 5. GCS motor subscore is 6.  Skin: Skin is warm, dry and intact.  Psychiatric: He has a normal mood and affect.    ED Course  Procedures (including critical care time) Labs Review Labs Reviewed  LIPASE, BLOOD - Abnormal; Notable for  the following:    Lipase 16 (*)    All other components within normal limits  COMPREHENSIVE METABOLIC PANEL - Abnormal; Notable for the following:    Potassium 3.2 (*)    Glucose, Bld 104 (*)    Calcium 8.3 (*)    Albumin 2.5 (*)    AST 12 (*)    ALT 13 (*)    Alkaline Phosphatase 164 (*)    All other components within normal limits  CBC - Abnormal; Notable for the following:    RBC 3.15 (*)    Hemoglobin 8.1 (*)    HCT 25.6 (*)    MCH 25.7 (*)    All other components within normal limits  URINALYSIS, ROUTINE W REFLEX MICROSCOPIC (NOT AT [facility]) - Abnormal; Notable for the following:    Color, Urine RED (*)    APPearance TURBID (*)    Hgb urine dipstick LARGE (*)    Bilirubin  Urine LARGE (*)    Ketones, ur 15 (*)    Protein, ur 100 (*)    Nitrite POSITIVE (*)    Leukocytes, UA MODERATE (*)    All other components within normal limits  URINE MICROSCOPIC-ADD ON - Abnormal; Notable for the following:    Bacteria, UA MANY (*)    All other components within normal limits  URINE CULTURE  TYPE AND SCREEN  ABO/RH    Consult: Gen. surgery. Advised that psoas abscess is not treated by general surgery but recommend repeating CT scan to confirm no drainable collection has reaccumulated. MDM   Final diagnoses:  UTI (lower urinary tract infection)  Trichomonas infection   Patient presents as outlined above. Patient complains of pain in the similar area to his prior abscess. Also complaining of lower abdominal pain and dysuria. Findings are positive for UTI as well as Trichomonas identified in the urine. He does not have neurologic deficit that suggests spinal involvement or epidural abscess. His neurologic function is good. CT does not show any reaccumulation of abscess or inflammatory change. At this time I suspect symptoms are due to UTI and possible prostatitis as well as consideration for residual pain from prior infection. The patient will have increased antibiotics coverage for STD  and Trichomonas and is currently having vancomycin by PICC line to treat to the prior infection. Patient is counseled for close follow-up.    Arby Barrette, MD 06/14/15 [phone]

## 2015-06-09 NOTE — ED Notes (Signed)
IV team at bedside accessing PICC

## 2015-06-09 NOTE — ED Notes (Signed)
[name]:: [phone] (Call if you need anything)

## 2015-06-09 NOTE — ED Notes (Signed)
Pt requesting to wait to have labs drawn with IV

## 2015-06-09 NOTE — Discharge Instructions (Signed)
Trichomoniasis Trichomoniasis is an infection caused by an organism called Trichomonas. The infection can affect both women and men. In women, the outer male genitalia and the vagina are affected. In men, the penis is mainly affected, but the prostate and other reproductive organs can also be involved. Trichomoniasis is a sexually transmitted infection (STI) and is most often passed to another person through sexual contact.  RISK FACTORS  Having unprotected sexual intercourse.  Having sexual intercourse with an infected partner. SIGNS AND SYMPTOMS  Symptoms of trichomoniasis in women include:  Abnormal gray-green frothy vaginal discharge.  Itching and irritation of the vagina.  Itching and irritation of the area outside the vagina. Symptoms of trichomoniasis in men include:   Penile discharge with or without pain.  Pain during urination. This results from inflammation of the urethra. DIAGNOSIS  Trichomoniasis may be found during a Pap test or physical exam. Your health care provider may use one of the following methods to help diagnose this infection:  Examining vaginal discharge under a microscope. For men, urethral discharge would be examined.  Testing the pH of the vagina with a test tape.  Using a vaginal swab test that checks for the Trichomonas organism. A test is available that provides results within a few minutes.  Doing a culture test for the organism. This is not usually needed. TREATMENT   You may be given medicine to fight the infection. Women should inform their health care provider if they could be or are pregnant. Some medicines used to treat the infection should not be taken during pregnancy.  Your health care provider may recommend over-the-counter medicines or creams to decrease itching or irritation.  Your sexual partner will need to be treated if infected. HOME CARE INSTRUCTIONS   Take medicines only as directed by your health care provider.  Take  over-the-counter medicine for itching or irritation as directed by your health care provider.  Do not have sexual intercourse while you have the infection.  Women should not douche or wear tampons while they have the infection.  Discuss your infection with your partner. Your partner may have gotten the infection from you, or you may have gotten it from your partner.  Have your sex partner get examined and treated if necessary.  Practice safe, informed, and protected sex.  See your health care provider for other STI testing. SEEK MEDICAL CARE IF:   You still have symptoms after you finish your medicine.  You develop abdominal pain.  You have pain when you urinate.  You have bleeding after sexual intercourse.  You develop a rash.  Your medicine makes you sick or makes you throw up (vomit). MAKE SURE YOU:  Understand these instructions.  Will watch your condition.  Will get help right away if you are not doing well or get worse. Document Released: 05/04/2001 Document Revised: 03/25/2014 Document Reviewed: 08/20/2013 [facility] Patient Information 2015 [place], [place]. This information is not intended to replace advice given to you by your health care provider. Make sure you discuss any questions you have with your health care provider. Urinary Tract Infection Urinary tract infections (UTIs) can develop anywhere along your urinary tract. Your urinary tract is your body's drainage system for removing wastes and extra water. Your urinary tract includes two kidneys, two ureters, a bladder, and a urethra. Your kidneys are a pair of bean-shaped organs. Each kidney is about the size of your fist. They are located below your ribs, one on each side of your spine. CAUSES Infections are  caused by microbes, which are microscopic organisms, including fungi, viruses, and bacteria. These organisms are so small that they can only be seen through a microscope. Bacteria are the microbes that most  commonly cause UTIs. SYMPTOMS  Symptoms of UTIs may vary by age and gender of the patient and by the location of the infection. Symptoms in young women typically include a frequent and intense urge to urinate and a painful, burning feeling in the bladder or urethra during urination. Older women and men are more likely to be tired, shaky, and weak and have muscle aches and abdominal pain. A fever may mean the infection is in your kidneys. Other symptoms of a kidney infection include pain in your back or sides below the ribs, nausea, and vomiting. DIAGNOSIS To diagnose a UTI, your caregiver will ask you about your symptoms. Your caregiver also will ask to provide a urine sample. The urine sample will be tested for bacteria and white blood cells. White blood cells are made by your body to help fight infection. TREATMENT  Typically, UTIs can be treated with medication. Because most UTIs are caused by a bacterial infection, they usually can be treated with the use of antibiotics. The choice of antibiotic and length of treatment depend on your symptoms and the type of bacteria causing your infection. HOME CARE INSTRUCTIONS  If you were prescribed antibiotics, take them exactly as your caregiver instructs you. Finish the medication even if you feel better after you have only taken some of the medication.  Drink enough water and fluids to keep your urine clear or pale yellow.  Avoid caffeine, tea, and carbonated beverages. They tend to irritate your bladder.  Empty your bladder often. Avoid holding urine for long periods of time.  Empty your bladder before and after sexual intercourse.  After a bowel movement, women should cleanse from front to back. Use each tissue only once. SEEK MEDICAL CARE IF:   You have back pain.  You develop a fever.  Your symptoms do not begin to resolve within 3 days. SEEK IMMEDIATE MEDICAL CARE IF:   You have severe back pain or lower abdominal pain.  You develop  chills.  You have nausea or vomiting.  You have continued burning or discomfort with urination. MAKE SURE YOU:   Understand these instructions.  Will watch your condition.  Will get help right away if you are not doing well or get worse. Document Released: 08/18/2005 Document Revised: 05/09/2012 Document Reviewed: 12/17/2011 [facility] Patient Information 2015 [place], [place]. This information is not intended to replace advice given to you by your health care provider. Make sure you discuss any questions you have with your health care provider. Sexually Transmitted Disease A sexually transmitted disease (STD) is a disease or infection that may be passed (transmitted) from person to person, usually during sexual activity. This may happen by way of saliva, semen, blood, vaginal mucus, or urine. Common STDs include:   Gonorrhea.   Chlamydia.   Syphilis.   HIV and AIDS.   Genital herpes.   Hepatitis B and C.   Trichomonas.   Human papillomavirus (HPV).   Pubic lice.   Scabies.  Mites.  Bacterial vaginosis. WHAT ARE CAUSES OF STDs? An STD may be caused by bacteria, a virus, or parasites. STDs are often transmitted during sexual activity if one person is infected. However, they may also be transmitted through nonsexual means. STDs may be transmitted after:   Sexual intercourse with an infected person.   Sharing sex  toys with an infected person.   Sharing needles with an infected person or using unclean piercing or tattoo needles.  Having intimate contact with the genitals, mouth, or rectal areas of an infected person.   Exposure to infected fluids during birth. WHAT ARE THE SIGNS AND SYMPTOMS OF STDs? Different STDs have different symptoms. Some people may not have any symptoms. If symptoms are present, they may include:   Painful or bloody urination.   Pain in the pelvis, abdomen, vagina, anus, throat, or eyes.   A skin rash, itching, or  irritation.  Growths, ulcerations, blisters, or sores in the genital and anal areas.  Abnormal vaginal discharge with or without bad odor.   Penile discharge in men.   Fever.   Pain or bleeding during sexual intercourse.   Swollen glands in the groin area.   Yellow skin and eyes (jaundice). This is seen with hepatitis.   Swollen testicles.  Infertility.  Sores and blisters in the mouth. HOW ARE STDs DIAGNOSED? To make a diagnosis, your health care provider may:   Take a medical history.   Perform a physical exam.   Take a sample of any discharge to examine.  Swab the throat, cervix, opening to the penis, rectum, or vagina for testing.  Test a sample of your first morning urine.   Perform blood tests.   Perform a Pap test, if this applies.   Perform a colposcopy.   Perform a laparoscopy.  HOW ARE STDs TREATED? Treatment depends on the STD. Some STDs may be treated but not cured.   Chlamydia, gonorrhea, trichomonas, and syphilis can be cured with antibiotic medicine.   Genital herpes, hepatitis, and HIV can be treated, but not cured, with prescribed medicines. The medicines lessen symptoms.   Genital warts from HPV can be treated with medicine or by freezing, burning (electrocautery), or surgery. Warts may come back.   HPV cannot be cured with medicine or surgery. However, abnormal areas may be removed from the cervix, vagina, or vulva.   If your diagnosis is confirmed, your recent sexual partners need treatment. This is true even if they are symptom-free or have a negative culture or evaluation. They should not have sex until their health care providers say it is okay. HOW CAN I REDUCE MY RISK OF GETTING AN STD? Take these steps to reduce your risk of getting an STD:  Use latex condoms, dental dams, and water-soluble lubricants during sexual activity. Do not use petroleum jelly or oils.  Avoid having multiple sex partners.  Do not have sex  with someone who has other sex partners.  Do not have sex with anyone you do not know or who is at high risk for an STD.  Avoid risky sex practices that can break your skin.  Do not have sex if you have open sores on your mouth or skin.  Avoid drinking too much alcohol or taking illegal drugs. Alcohol and drugs can affect your judgment and put you in a vulnerable position.  Avoid engaging in oral and anal sex acts.  Get vaccinated for HPV and hepatitis. If you have not received these vaccines in the past, talk to your health care provider about whether one or both might be right for you.   If you are at risk of being infected with HIV, it is recommended that you take a prescription medicine daily to prevent HIV infection. This is called pre-exposure prophylaxis (PrEP). You are considered at risk if:  You are a man  who has sex with other men (MSM).  You are a heterosexual man or woman and are sexually active with more than one partner.  You take drugs by injection.  You are sexually active with a partner who has HIV.  Talk with your health care provider about whether you are at high risk of being infected with HIV. If you choose to begin PrEP, you should first be tested for HIV. You should then be tested every 3 months for as long as you are taking PrEP.  WHAT SHOULD I DO IF I THINK I HAVE AN STD?  See your health care provider.   Tell your sexual partner(s). They should be tested and treated for any STDs.  Do not have sex until your health care provider says it is okay. WHEN SHOULD I GET IMMEDIATE MEDICAL CARE? Contact your health care provider right away if:   You have severe abdominal pain.  You are a man and notice swelling or pain in your testicles.  You are a woman and notice swelling or pain in your vagina. Document Released: 01/29/2003 Document Revised: 11/13/2013 Document Reviewed: 05/29/2013 [facility] Patient Information 2015 [place]. This information is  not intended to replace advice given to you by your health care provider. Make sure you discuss any questions you have with your health care provider. .res

## 2015-06-11 ENCOUNTER — Inpatient Hospital Stay (HOSPITAL_COMMUNITY): Payer: Medicaid Other

## 2015-06-11 ENCOUNTER — Encounter (HOSPITAL_COMMUNITY): Payer: Self-pay | Admitting: Emergency Medicine

## 2015-06-11 ENCOUNTER — Emergency Department (HOSPITAL_COMMUNITY): Payer: Medicaid Other

## 2015-06-11 ENCOUNTER — Inpatient Hospital Stay (HOSPITAL_COMMUNITY)
Admission: EM | Admit: 2015-06-11 | Discharge: 2015-06-19 | DRG: 871 | Disposition: A | Discharge status: Home / Self Care | Payer: Medicaid Other | Attending: Internal Medicine | Admitting: Internal Medicine

## 2015-06-11 DIAGNOSIS — N179 Acute kidney failure, unspecified: Secondary | ICD-10-CM | POA: Diagnosis not present

## 2015-06-11 DIAGNOSIS — R7881 Bacteremia: Secondary | ICD-10-CM | POA: Diagnosis not present

## 2015-06-11 DIAGNOSIS — Z8614 Personal history of Methicillin resistant Staphylococcus aureus infection: Secondary | ICD-10-CM | POA: Diagnosis not present

## 2015-06-11 DIAGNOSIS — E876 Hypokalemia: Secondary | ICD-10-CM | POA: Diagnosis present

## 2015-06-11 DIAGNOSIS — K6812 Psoas muscle abscess: Secondary | ICD-10-CM | POA: Diagnosis not present

## 2015-06-11 DIAGNOSIS — R509 Fever, unspecified: Secondary | ICD-10-CM | POA: Diagnosis present

## 2015-06-11 DIAGNOSIS — M545 Low back pain, unspecified: Secondary | ICD-10-CM

## 2015-06-11 DIAGNOSIS — R319 Hematuria, unspecified: Secondary | ICD-10-CM

## 2015-06-11 DIAGNOSIS — D62 Acute posthemorrhagic anemia: Secondary | ICD-10-CM | POA: Diagnosis present

## 2015-06-11 DIAGNOSIS — M009 Pyogenic arthritis, unspecified: Secondary | ICD-10-CM | POA: Diagnosis present

## 2015-06-11 DIAGNOSIS — B9562 Methicillin resistant Staphylococcus aureus infection as the cause of diseases classified elsewhere: Secondary | ICD-10-CM | POA: Diagnosis present

## 2015-06-11 DIAGNOSIS — F17210 Nicotine dependence, cigarettes, uncomplicated: Secondary | ICD-10-CM | POA: Diagnosis present

## 2015-06-11 DIAGNOSIS — Z9119 Patient's noncompliance with other medical treatment and regimen: Secondary | ICD-10-CM | POA: Diagnosis present

## 2015-06-11 DIAGNOSIS — D649 Anemia, unspecified: Secondary | ICD-10-CM

## 2015-06-11 DIAGNOSIS — T368X5A Adverse effect of other systemic antibiotics, initial encounter: Secondary | ICD-10-CM | POA: Diagnosis present

## 2015-06-11 DIAGNOSIS — L0291 Cutaneous abscess, unspecified: Secondary | ICD-10-CM | POA: Diagnosis not present

## 2015-06-11 DIAGNOSIS — E860 Dehydration: Secondary | ICD-10-CM | POA: Diagnosis present

## 2015-06-11 DIAGNOSIS — R748 Abnormal levels of other serum enzymes: Secondary | ICD-10-CM

## 2015-06-11 DIAGNOSIS — Z87828 Personal history of other (healed) physical injury and trauma: Secondary | ICD-10-CM

## 2015-06-11 DIAGNOSIS — Z87442 Personal history of urinary calculi: Secondary | ICD-10-CM | POA: Diagnosis not present

## 2015-06-11 DIAGNOSIS — D638 Anemia in other chronic diseases classified elsewhere: Secondary | ICD-10-CM | POA: Diagnosis present

## 2015-06-11 DIAGNOSIS — B377 Candidal sepsis: Principal | ICD-10-CM | POA: Diagnosis present

## 2015-06-11 DIAGNOSIS — G8929 Other chronic pain: Secondary | ICD-10-CM

## 2015-06-11 DIAGNOSIS — R52 Pain, unspecified: Secondary | ICD-10-CM

## 2015-06-11 LAB — BASIC METABOLIC PANEL
ANION GAP: 7 (ref 5–15)
BUN: 11 mg/dL (ref 6–20)
CALCIUM: 8.1 mg/dL — AB (ref 8.9–10.3)
CO2: 26 mmol/L (ref 22–32)
CREATININE: 1.7 mg/dL — AB (ref 0.61–1.24)
Chloride: 110 mmol/L (ref 101–111)
GFR calc Af Amer: 58 mL/min — ABNORMAL LOW (ref >60)
GFR, EST NON AFRICAN AMERICAN: 50 mL/min — AB (ref >60)
Glucose, Bld: 96 mg/dL (ref 65–99)
Potassium: 3.9 mmol/L (ref 3.5–5.1)
Sodium: 143 mmol/L (ref 135–145)

## 2015-06-11 LAB — URINE CULTURE: Culture: NO GROWTH

## 2015-06-11 LAB — COMPREHENSIVE METABOLIC PANEL
ALT: 11 U/L — ABNORMAL LOW (ref 17–63)
AST: 12 U/L — AB (ref 15–41)
Albumin: 2.6 g/dL — ABNORMAL LOW (ref 3.5–5.0)
Alkaline Phosphatase: 163 U/L — ABNORMAL HIGH (ref 38–126)
Anion gap: 8 (ref 5–15)
BILIRUBIN TOTAL: 0.6 mg/dL (ref 0.3–1.2)
BUN: 12 mg/dL (ref 6–20)
CO2: 27 mmol/L (ref 22–32)
Calcium: 8.2 mg/dL — ABNORMAL LOW (ref 8.9–10.3)
Chloride: 104 mmol/L (ref 101–111)
Creatinine, Ser: 1.51 mg/dL — ABNORMAL HIGH (ref 0.61–1.24)
GFR calc Af Amer: >60 mL/min (ref >60)
GFR, EST NON AFRICAN AMERICAN: 58 mL/min — AB (ref >60)
Glucose, Bld: 143 mg/dL — ABNORMAL HIGH (ref 65–99)
Potassium: 2.7 mmol/L — CL (ref 3.5–5.1)
Sodium: 139 mmol/L (ref 135–145)
TOTAL PROTEIN: 7 g/dL (ref 6.5–8.1)

## 2015-06-11 LAB — IRON AND TIBC
IRON: 8 ug/dL — AB (ref 45–182)
Saturation Ratios: 4 % — ABNORMAL LOW (ref 17.9–39.5)
TIBC: 220 ug/dL — ABNORMAL LOW (ref 250–450)
UIBC: 212 ug/dL

## 2015-06-11 LAB — CBC WITH DIFFERENTIAL/PLATELET
BASOS ABS: 0 10*3/uL (ref 0.0–0.1)
Basophils Relative: 0 % (ref 0–1)
EOS ABS: 0 10*3/uL (ref 0.0–0.7)
Eosinophils Relative: 0 % (ref 0–5)
HCT: 25.4 % — ABNORMAL LOW (ref 39.0–52.0)
Hemoglobin: 8.4 g/dL — ABNORMAL LOW (ref 13.0–17.0)
LYMPHS PCT: 8 % — AB (ref 12–46)
Lymphs Abs: 0.9 10*3/uL (ref 0.7–4.0)
MCH: 26.9 pg (ref 26.0–34.0)
MCHC: 33.1 g/dL (ref 30.0–36.0)
MCV: 81.4 fL (ref 78.0–100.0)
MONO ABS: 0.8 10*3/uL (ref 0.1–1.0)
MONOS PCT: 7 % (ref 3–12)
NEUTROS ABS: 9.6 10*3/uL — AB (ref 1.7–7.7)
Neutrophils Relative %: 85 % — ABNORMAL HIGH (ref 43–77)
Platelets: 243 10*3/uL (ref 150–400)
RBC: 3.12 MIL/uL — AB (ref 4.22–5.81)
RDW: 13.7 % (ref 11.5–15.5)
WBC: 11.4 10*3/uL — ABNORMAL HIGH (ref 4.0–10.5)

## 2015-06-11 LAB — URINALYSIS, ROUTINE W REFLEX MICROSCOPIC
BILIRUBIN URINE: NEGATIVE
Glucose, UA: NEGATIVE mg/dL
Ketones, ur: NEGATIVE mg/dL
Leukocytes, UA: NEGATIVE
Nitrite: NEGATIVE
PH: 6.5 (ref 5.0–8.0)
Specific Gravity, Urine: <1.005 — ABNORMAL LOW (ref 1.005–1.030)
Urobilinogen, UA: 0.2 mg/dL (ref 0.0–1.0)

## 2015-06-11 LAB — FOLATE: FOLATE: 23 ng/mL (ref >5.9)

## 2015-06-11 LAB — URINE MICROSCOPIC-ADD ON

## 2015-06-11 LAB — RETICULOCYTES
RBC.: 3.1 MIL/uL — ABNORMAL LOW (ref 4.22–5.81)
RETIC COUNT ABSOLUTE: 27.9 10*3/uL (ref 19.0–186.0)
Retic Ct Pct: 0.9 % (ref 0.4–3.1)

## 2015-06-11 LAB — SEDIMENTATION RATE: Sed Rate: 115 mm/hr — ABNORMAL HIGH (ref 0–16)

## 2015-06-11 LAB — FERRITIN: Ferritin: 138 ng/mL (ref 24–336)

## 2015-06-11 LAB — TROPONIN I: Troponin I: <0.03 ng/mL (ref <0.031)

## 2015-06-11 LAB — I-STAT CG4 LACTIC ACID, ED: Lactic Acid, Venous: 0.67 mmol/L (ref 0.5–2.0)

## 2015-06-11 LAB — VITAMIN B12: Vitamin B-12: 143 pg/mL — ABNORMAL LOW (ref 180–914)

## 2015-06-11 MED ORDER — SODIUM CHLORIDE 0.9 % IV BOLUS (SEPSIS)
500.0000 mL | Freq: Once | INTRAVENOUS | Status: AC
  Administered 2015-06-11: 500 mL via INTRAVENOUS

## 2015-06-11 MED ORDER — POTASSIUM CHLORIDE CRYS ER 20 MEQ PO TBCR
40.0000 meq | EXTENDED_RELEASE_TABLET | Freq: Once | ORAL | Status: AC
  Administered 2015-06-11: 40 meq via ORAL
  Filled 2015-06-11: qty 2

## 2015-06-11 MED ORDER — ACETAMINOPHEN 500 MG PO TABS: 500.0000 mg | ORAL_TABLET | Freq: Every evening | ORAL | Status: DC | PRN

## 2015-06-11 MED ORDER — METHOCARBAMOL 500 MG PO TABS
500.0000 mg | ORAL_TABLET | Freq: Three times a day (TID) | ORAL | Status: DC | PRN
  Administered 2015-06-13 – 2015-06-16 (×2): 500 mg via ORAL
  Filled 2015-06-11 (×2): qty 1

## 2015-06-11 MED ORDER — VANCOMYCIN HCL 10 G IV SOLR
1500.0000 mg | Freq: Once | INTRAVENOUS | Status: AC
  Administered 2015-06-11: 1500 mg via INTRAVENOUS
  Filled 2015-06-11: qty 1500

## 2015-06-11 MED ORDER — SODIUM CHLORIDE 0.9 % IJ SOLN
3.0000 mL | Freq: Two times a day (BID) | INTRAMUSCULAR | Status: DC
  Administered 2015-06-11 – 2015-06-19 (×8): 3 mL via INTRAVENOUS

## 2015-06-11 MED ORDER — POTASSIUM CHLORIDE 10 MEQ/100ML IV SOLN
10.0000 meq | Freq: Once | INTRAVENOUS | Status: DC
  Filled 2015-06-11: qty 100

## 2015-06-11 MED ORDER — OXYCODONE HCL 5 MG PO TABS
10.0000 mg | ORAL_TABLET | ORAL | Status: DC | PRN
  Administered 2015-06-11 – 2015-06-15 (×16): 10 mg via ORAL
  Filled 2015-06-11 (×18): qty 2

## 2015-06-11 MED ORDER — PIPERACILLIN-TAZOBACTAM 3.375 G IVPB
3.3750 g | Freq: Three times a day (TID) | INTRAVENOUS | Status: DC
  Administered 2015-06-11 – 2015-06-12 (×5): 3.375 g via INTRAVENOUS
  Filled 2015-06-11 (×7): qty 50

## 2015-06-11 MED ORDER — POTASSIUM CHLORIDE 10 MEQ/100ML IV SOLN
10.0000 meq | Freq: Once | INTRAVENOUS | Status: AC
  Administered 2015-06-11: 10 meq via INTRAVENOUS
  Filled 2015-06-11: qty 100

## 2015-06-11 MED ORDER — POTASSIUM CHLORIDE 10 MEQ/100ML IV SOLN: 10.0000 meq | INTRAVENOUS | Status: DC

## 2015-06-11 MED ORDER — DIPHENHYDRAMINE-APAP (SLEEP) 25-500 MG PO TABS: 1.0000 | ORAL_TABLET | Freq: Every evening | ORAL | Status: DC | PRN

## 2015-06-11 MED ORDER — GABAPENTIN 300 MG PO CAPS
300.0000 mg | ORAL_CAPSULE | Freq: Three times a day (TID) | ORAL | Status: DC
  Administered 2015-06-11 – 2015-06-19 (×24): 300 mg via ORAL
  Filled 2015-06-11 (×24): qty 1

## 2015-06-11 MED ORDER — GADOBENATE DIMEGLUMINE 529 MG/ML IV SOLN
15.0000 mL | Freq: Once | INTRAVENOUS | Status: AC | PRN
  Administered 2015-06-11: 15 mL via INTRAVENOUS

## 2015-06-11 MED ORDER — SODIUM CHLORIDE 0.9 % IV SOLN
1000.0000 mL | Freq: Once | INTRAVENOUS | Status: AC
  Administered 2015-06-11: 1000 mL via INTRAVENOUS

## 2015-06-11 MED ORDER — SODIUM CHLORIDE 0.9 % IV SOLN
1000.0000 mL | INTRAVENOUS | Status: DC
  Administered 2015-06-12: 1000 mL via INTRAVENOUS

## 2015-06-11 MED ORDER — LEVOFLOXACIN IN D5W 750 MG/150ML IV SOLN
750.0000 mg | Freq: Once | INTRAVENOUS | Status: DC
  Filled 2015-06-11: qty 150

## 2015-06-11 MED ORDER — DOCUSATE SODIUM 100 MG PO CAPS
100.0000 mg | ORAL_CAPSULE | Freq: Two times a day (BID) | ORAL | Status: DC
  Administered 2015-06-11 – 2015-06-19 (×3): 100 mg via ORAL
  Filled 2015-06-11 (×8): qty 1

## 2015-06-11 MED ORDER — POLYETHYLENE GLYCOL 3350 17 G PO PACK
17.0000 g | PACK | Freq: Two times a day (BID) | ORAL | Status: DC
  Administered 2015-06-11 – 2015-06-16 (×2): 17 g via ORAL
  Filled 2015-06-11 (×7): qty 1

## 2015-06-11 MED ORDER — ENOXAPARIN SODIUM 40 MG/0.4ML ~~LOC~~ SOLN
40.0000 mg | SUBCUTANEOUS | Status: DC
  Filled 2015-06-11: qty 0.4

## 2015-06-11 MED ORDER — VANCOMYCIN HCL IN DEXTROSE 1-5 GM/200ML-% IV SOLN
INTRAVENOUS | Status: AC
  Filled 2015-06-11: qty 200

## 2015-06-11 MED ORDER — ACETAMINOPHEN 325 MG PO TABS
650.0000 mg | ORAL_TABLET | Freq: Four times a day (QID) | ORAL | Status: DC | PRN
  Administered 2015-06-11 – 2015-06-19 (×3): 650 mg via ORAL
  Filled 2015-06-11 (×3): qty 2

## 2015-06-11 MED ORDER — SODIUM CHLORIDE 0.9 % IJ SOLN
3.0000 mL | Freq: Two times a day (BID) | INTRAMUSCULAR | Status: DC
  Administered 2015-06-11 – 2015-06-19 (×8): 3 mL via INTRAVENOUS

## 2015-06-11 MED ORDER — SODIUM CHLORIDE 0.9 % IV SOLN
1000.0000 mL | INTRAVENOUS | Status: DC
  Administered 2015-06-11 (×2): 1000 mL via INTRAVENOUS

## 2015-06-11 MED ORDER — POTASSIUM CHLORIDE 10 MEQ/100ML IV SOLN
10.0000 meq | INTRAVENOUS | Status: AC
  Administered 2015-06-11 (×2): 10 meq via INTRAVENOUS
  Filled 2015-06-11: qty 100

## 2015-06-11 MED ORDER — OXYCODONE-ACETAMINOPHEN 5-325 MG PO TABS: 1.0000 | ORAL_TABLET | ORAL | Status: DC | PRN

## 2015-06-11 MED ORDER — HYDROMORPHONE HCL 1 MG/ML IJ SOLN
1.0000 mg | INTRAMUSCULAR | Status: DC | PRN
  Administered 2015-06-11 – 2015-06-12 (×6): 1 mg via INTRAVENOUS
  Filled 2015-06-11 (×6): qty 1

## 2015-06-11 MED ORDER — VANCOMYCIN HCL IN DEXTROSE 1-5 GM/200ML-% IV SOLN
1000.0000 mg | Freq: Once | INTRAVENOUS | Status: DC
  Filled 2015-06-11: qty 200

## 2015-06-11 MED ORDER — LEVOFLOXACIN IN D5W 750 MG/150ML IV SOLN
750.0000 mg | INTRAVENOUS | Status: DC
  Administered 2015-06-11: 750 mg via INTRAVENOUS
  Filled 2015-06-11: qty 150

## 2015-06-11 MED ORDER — SENNA 8.6 MG PO TABS
1.0000 | ORAL_TABLET | Freq: Every day | ORAL | Status: DC
  Administered 2015-06-11: 8.6 mg via ORAL
  Filled 2015-06-11 (×2): qty 1

## 2015-06-11 MED ORDER — HYDROMORPHONE HCL 1 MG/ML IJ SOLN
1.0000 mg | Freq: Once | INTRAMUSCULAR | Status: AC
  Administered 2015-06-11: 1 mg via INTRAVENOUS
  Filled 2015-06-11: qty 1

## 2015-06-11 MED ORDER — PIPERACILLIN-TAZOBACTAM 3.375 G IVPB
INTRAVENOUS | Status: AC
  Filled 2015-06-11: qty 50

## 2015-06-11 MED ORDER — SODIUM CHLORIDE 0.9 % IV SOLN
250.0000 mL | INTRAVENOUS | Status: DC | PRN
  Administered 2015-06-13: 14:00:00 via INTRAVENOUS

## 2015-06-11 MED ORDER — VANCOMYCIN HCL IN DEXTROSE 1-5 GM/200ML-% IV SOLN: 1000.0000 mg | Freq: Two times a day (BID) | INTRAVENOUS | Status: DC

## 2015-06-11 MED ORDER — PIPERACILLIN-TAZOBACTAM 3.375 G IVPB
3.3750 g | Freq: Once | INTRAVENOUS | Status: DC
  Filled 2015-06-11: qty 50

## 2015-06-11 MED ORDER — VANCOMYCIN HCL 10 G IV SOLR
1250.0000 mg | Freq: Two times a day (BID) | INTRAVENOUS | Status: DC
  Administered 2015-06-11 – 2015-06-13 (×3): 1250 mg via INTRAVENOUS
  Filled 2015-06-11 (×4): qty 1250

## 2015-06-11 MED ORDER — SODIUM CHLORIDE 0.9 % IV SOLN
INTRAVENOUS | Status: AC
  Administered 2015-06-11: 07:00:00 via INTRAVENOUS

## 2015-06-11 MED ORDER — SODIUM CHLORIDE 0.9 % IJ SOLN: 3.0000 mL | INTRAMUSCULAR | Status: DC | PRN

## 2015-06-11 MED ORDER — DIPHENHYDRAMINE HCL 25 MG PO CAPS: 25.0000 mg | ORAL_CAPSULE | Freq: Every evening | ORAL | Status: DC | PRN

## 2015-06-11 NOTE — Progress Notes (Signed)
ANTIBIOTIC CONSULT NOTE-Preliminary  Pharmacy Consult for Zosyn, levofloxacin, vancomycin Indication: pneumonia  Allergies  Allergen Reactions  . Vicodin [Hydrocodone-Acetaminophen] Hives and Itching  . Adhesive [Tape]     Patient Measurements: Height: 6' (182.9 cm) Weight: 160 lb (72.576 kg) IBW/kg (Calculated) : 77.6  Vital Signs: Temp: 100.7 F (38.2 C) (07/20 0234) Temp Source: Oral (07/20 0234) BP: 130/94 mmHg (07/20 0503) Pulse Rate: 81 (07/20 0503)  Labs:  Recent Labs  06/09/15 1848 06/11/15 0345  WBC 8.4 11.4*  HGB 8.1* 8.4*  PLT 248 243  CREATININE 1.20 1.51*    Estimated Creatinine Clearance: 69.4 mL/min (by C-G formula based on Cr of 1.51).  No results for input(s): VANCOTROUGH, VANCOPEAK, VANCORANDOM, GENTTROUGH, GENTPEAK, GENTRANDOM, TOBRATROUGH, TOBRAPEAK, TOBRARND, AMIKACINPEAK, AMIKACINTROU, AMIKACIN in the last 72 hours.   Microbiology: Recent Results (from the past 720 hour(s))  Culture, blood (routine x 2)     Status: None   Collection Time: 05/15/15  4:00 PM  Result Value Ref Range Status   Specimen Description BLOOD LEFT FOREARM DRAWN BY RN  Final   Special Requests   Final    BOTTLES DRAWN AEROBIC AND ANAEROBIC AEB=6CC ANA=4CC   Culture NO GROWTH 5 DAYS  Final   Report Status 05/20/2015 FINAL  Final  Urine culture     Status: None   Collection Time: 05/15/15  4:13 PM  Result Value Ref Range Status   Specimen Description URINE, CLEAN CATCH  Final   Special Requests NONE  Final   Culture   Final    >=100,000 COLONIES/mL METHICILLIN RESISTANT STAPHYLOCOCCUS AUREUS [facility]    Report Status 05/19/2015 FINAL  Final   Organism ID, Bacteria METHICILLIN RESISTANT STAPHYLOCOCCUS AUREUS  Final      Susceptibility   Methicillin resistant staphylococcus aureus - MIC*    CIPROFLOXACIN >=8 RESISTANT Resistant     GENTAMICIN <=0.5 SENSITIVE Sensitive     NITROFURANTOIN <=16 SENSITIVE Sensitive     OXACILLIN >=4 RESISTANT  Resistant     TETRACYCLINE <=1 SENSITIVE Sensitive     VANCOMYCIN 1 SENSITIVE Sensitive     TRIMETH/SULFA <=10 SENSITIVE Sensitive     CLINDAMYCIN <=0.25 SENSITIVE Sensitive     RIFAMPIN <=0.5 SENSITIVE Sensitive     Inducible Clindamycin NEGATIVE Sensitive     * >=100,000 COLONIES/mL METHICILLIN RESISTANT STAPHYLOCOCCUS AUREUS  Culture, blood (routine x 2)     Status: None   Collection Time: 05/15/15  4:25 PM  Result Value Ref Range Status   Specimen Description BLOOD RIGHT WRIST  Final   Special Requests BOTTLES DRAWN AEROBIC AND ANAEROBIC 8CC EACH  Final   Culture  Setup Time   Final    GRAM POSITIVE COCCI IN CLUSTERS IN BOTH AEROBIC AND ANAEROBIC BOTTLES CRITICAL RESULT CALLED TO, READ BACK BY AND VERIFIED WITH: HUDSON K. AT [facility] AT 1043A ON [id number] BY THOMPSON S.    Culture   Final    METHICILLIN RESISTANT STAPHYLOCOCCUS AUREUS [facility]    Report Status 05/18/2015 FINAL  Final   Organism ID, Bacteria METHICILLIN RESISTANT STAPHYLOCOCCUS AUREUS  Final      Susceptibility   Methicillin resistant staphylococcus aureus - MIC*    CIPROFLOXACIN >=8 RESISTANT Resistant     ERYTHROMYCIN >=8 RESISTANT Resistant     GENTAMICIN <=0.5 SENSITIVE Sensitive     OXACILLIN >=4 RESISTANT Resistant     TETRACYCLINE <=1 SENSITIVE Sensitive     VANCOMYCIN 1 SENSITIVE Sensitive     TRIMETH/SULFA <=10  SENSITIVE Sensitive     CLINDAMYCIN <=0.25 SENSITIVE Sensitive     RIFAMPIN <=0.5 SENSITIVE Sensitive     Inducible Clindamycin NEGATIVE Sensitive     * METHICILLIN RESISTANT STAPHYLOCOCCUS AUREUS  MRSA PCR Screening     Status: Abnormal   Collection Time: 05/16/15  9:08 AM  Result Value Ref Range Status   MRSA by PCR POSITIVE (A) NEGATIVE Final    Comment: RESULT CALLED TO, READ BACK BY AND VERIFIED WITH: C. HUDSON RN 10:50 05/16/15 ([email or web address])        The GeneXpert MRSA Assay (FDA approved for NASAL specimens only), is one component of a comprehensive MRSA  colonization surveillance program. It is not intended to diagnose MRSA infection nor to guide or monitor treatment for MRSA infections.   Culture, routine-abscess     Status: None   Collection Time: 05/16/15  3:47 PM  Result Value Ref Range Status   Specimen Description ABSCESS  Final   Special Requests   Final    CRANIAL ASPECT OF THE LEFT ILIOPSOAS ABSCESS SYRINGE 1   Gram Stain   Final    ABUNDANT WBC PRESENT,BOTH PMN AND MONONUCLEAR NO SQUAMOUS EPITHELIAL CELLS SEEN MODERATE GRAM POSITIVE COCCI IN PAIRS IN CLUSTERS Performed at [facility]    Culture   Final    ABUNDANT METHICILLIN RESISTANT STAPHYLOCOCCUS AUREUS Note: RIFAMPIN AND GENTAMICIN SHOULD NOT BE USED AS SINGLE DRUGS FOR TREATMENT OF STAPH INFECTIONS. This organism is presumed to be Clindamycin resistant based on detection of inducible Clindamycin resistance. CRITICAL RESULT CALLED TO, READ BACK BY AND  VERIFIED WITH: TRANACE D@ 0856 ON [id number] BY [facility] Performed at [facility]    Report Status 05/19/2015 FINAL  Final   Organism ID, Bacteria METHICILLIN RESISTANT STAPHYLOCOCCUS AUREUS  Final      Susceptibility   Methicillin resistant staphylococcus aureus - MIC*    CLINDAMYCIN <=0.25 SENSITIVE Sensitive     ERYTHROMYCIN >=8 RESISTANT Resistant     GENTAMICIN <=0.5 SENSITIVE Sensitive     LEVOFLOXACIN 4 INTERMEDIATE Intermediate     OXACILLIN >=4 RESISTANT Resistant     PENICILLIN >=0.5 RESISTANT Resistant     RIFAMPIN <=0.5 SENSITIVE Sensitive     TRIMETH/SULFA <=10 SENSITIVE Sensitive     VANCOMYCIN 1 SENSITIVE Sensitive     TETRACYCLINE 2 SENSITIVE Sensitive     * ABUNDANT METHICILLIN RESISTANT STAPHYLOCOCCUS AUREUS  Culture, blood (routine x 2)     Status: None   Collection Time: 05/18/15 12:30 PM  Result Value Ref Range Status   Specimen Description BLOOD LEFT HAND  Final   Special Requests BOTTLES DRAWN AEROBIC AND ANAEROBIC 5CC  Final   Culture NO GROWTH 5 DAYS  Final   Report  Status 05/23/2015 FINAL  Final  Culture, blood (routine x 2)     Status: None   Collection Time: 05/18/15 12:40 PM  Result Value Ref Range Status   Specimen Description BLOOD LEFT HAND  Final   Special Requests BOTTLES DRAWN AEROBIC AND ANAEROBIC 5CC  Final   Culture NO GROWTH 5 DAYS  Final   Report Status 05/23/2015 FINAL  Final  Blood culture (routine x 2)     Status: None   Collection Time: 06/02/15  4:20 PM  Result Value Ref Range Status   Specimen Description BLOOD RIGHT ANTECUBITAL  Final   Special Requests BOTTLES DRAWN AEROBIC AND ANAEROBIC 6CC  Final   Culture NO GROWTH 7 DAYS  Final   Report Status 06/09/2015 FINAL  Final  Blood culture (routine x 2)     Status: None   Collection Time: 06/02/15  4:27 PM  Result Value Ref Range Status   Specimen Description BLOOD LEFT HAND  Final   Special Requests BOTTLES DRAWN AEROBIC AND ANAEROBIC 6CC  Final   Culture NO GROWTH 7 DAYS  Final   Report Status 06/09/2015 FINAL  Final  Culture, blood (routine x 2)     Status: None (Preliminary result)   Collection Time: 06/11/15  3:45 AM  Result Value Ref Range Status   Specimen Description BLOOD LEFT ANTECUBITAL  Final   Special Requests   Final    BOTTLES DRAWN AEROBIC AND ANAEROBIC AEB 8CC ANA 10CC   Culture PENDING  Incomplete   Report Status PENDING  Incomplete  Culture, blood (routine x 2)     Status: None (Preliminary result)   Collection Time: 06/11/15  3:55 AM  Result Value Ref Range Status   Specimen Description BLOOD PICC  Final   Special Requests BOTTLES DRAWN AEROBIC ONLY 8CC  Final   Culture PENDING  Incomplete   Report Status PENDING  Incomplete    Medical History: Past Medical History  Diagnosis Date  . H/O back injury   . MRSA (methicillin resistant Staphylococcus aureus)   . 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 MRSA bacteremia 04/2015    Medications:  Scheduled:  . sodium chloride   Intravenous STAT  . docusate sodium  100 mg Oral BID  . enoxaparin  (LOVENOX) injection  40 mg Subcutaneous Q24H  . gabapentin  300 mg Oral TID  . levofloxacin (LEVAQUIN) IV  750 mg Intravenous Once  . piperacillin-tazobactam (ZOSYN)  IV  3.375 g Intravenous Once  . polyethylene glycol  17 g Oral BID  . potassium chloride  10 mEq Intravenous Q1 Hr x 2  . potassium chloride  40 mEq Oral Once  . senna  1 tablet Oral QHS  . sodium chloride  3 mL Intravenous Q12H  . sodium chloride  3 mL Intravenous Q12H  . vancomycin  1,000 mg Intravenous Once    Assessment: 39 yo male admitted for workup due to fever and CXR showing atelectasis vs pneumonia. Recent admission on 6/23 for iliopsoas abscess.   Goal of Therapy:  Vancomycin trough level 15-20 mcg/ml  Plan:  Preliminary review of pertinent patient information completed.  Protocol will be initiated with a one-time dose(s) of Zosyn 3.375 grams x 1, levofloxacin  IV x 1, and vancomycin 1000 mg IV x 1.  Jeani Hawking clinical pharmacist will complete review during morning rounds to assess patient and finalize treatment regimen.  Larysa Pall, Berneice Heinrich, RPH 06/11/2015,6:13 AM

## 2015-06-11 NOTE — ED Notes (Signed)
Report given to Dominique RN on 300 

## 2015-06-11 NOTE — Progress Notes (Signed)
MEDICATION RELATED CONSULT NOTE - INITIAL   Pharmacy Consult for Pain Management Consult (requested during progression)  Indication: Pt c/o uncontrolled pain, 10/10  Allergies  Allergen Reactions  . Vicodin [Hydrocodone-Acetaminophen] Hives and Itching  . Adhesive [Tape] Rash   Patient Measurements: Height: 6' (182.9 cm) Weight: 160 lb (72.576 kg) IBW/kg (Calculated) : 77.6  Vital Signs: Temp: 98.2 F (36.8 C) (07/20 1118) Temp Source: Oral (07/20 0234) BP: 130/94 mmHg (07/20 0503) Pulse Rate: 81 (07/20 0503) Intake/Output from previous day:   Intake/Output from this shift: Total I/O In: 936.7 [P.O.:170; I.V.:516.7; IV Piggyback:250] Out: 450 [Urine:450]  Labs:  Recent Labs  06/09/15 1848 06/11/15 0345  WBC 8.4 11.4*  HGB 8.1* 8.4*  HCT 25.6* 25.4*  PLT 248 243  CREATININE 1.20 1.51*  ALBUMIN 2.5* 2.6*  PROT 6.5 7.0  AST 12* 12*  ALT 13* 11*  ALKPHOS 164* 163*  BILITOT 0.5 0.6   Estimated Creatinine Clearance: 69.4 mL/min (by C-G formula based on Cr of 1.51).  Medical History: Past Medical History  Diagnosis Date  . H/O back injury   . MRSA (methicillin resistant Staphylococcus aureus)   . 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 MRSA bacteremia 04/2015   Medications:  Scheduled:  . sodium chloride   Intravenous STAT  . docusate sodium  100 mg Oral BID  . enoxaparin (LOVENOX) injection  40 mg Subcutaneous Q24H  . gabapentin  300 mg Oral TID  . levofloxacin (LEVAQUIN) IV  750 mg Intravenous Q24H  . piperacillin-tazobactam (ZOSYN)  IV  3.375 g Intravenous Q8H  . polyethylene glycol  17 g Oral BID  . senna  1 tablet Oral QHS  . sodium chloride  3 mL Intravenous Q12H  . sodium chloride  3 mL Intravenous Q12H  . vancomycin  1,250 mg Intravenous Q12H  . vancomycin  1,500 mg Intravenous Once   Assessment: Chief Complaint: Pain in legs HPI: 39 yo male with recent admission 05/15/2015 for iliopsoas abscess c/o sciatica in bilateral legs. Pt came to ER for  evaluation . Pt had low grade temp in ED, along with mild ARF, and hypokalemia. And anemia. + dry cough. CXR showed ? Atelectasis vs pneumonia. Pt will be admitted for w/up of fever.  Pt denies IV drug use.  Pt had recent admission for pain, notes reviewed.  Current scheduled medications listed above.  PRN medications:  Tylenol 500mg  qhsprn, Robaxin 500mg  po q8hprn spasms, Oxy IR 10mg  po q4hprn pain,  Percocet 1-2 q4hprn pain.  Patient refused to have BP taken at this time due to being in pain, did agree to having temp taken which was 98.2. Patient still in 10/10 pain. NP made aware, help with repositioning offered but declined. Patient also complaining of IV beeping. Explained to patient that site becomes occluded when bending his arm which cause the alarm, offered to restart IV, but patient declines at this time.  Goal of Therapy:  Pain control  Recommendations to consider: 1.  Consider adding Dilaudid 0.5mg  IV q4hprn severe pain. 2.  Alternatively, consider adding Dilaudid PCA, standard dose 3.  Consider d/c Percocet and switch Oxy IR to 10mg  PO TID scheduled (hold for sedation or sleeping) 4.  Consider adding Fentanyl Patch 25mcg q72h while hospitalized.    Margo AyeHall, [name] A 06/11/2015,1:06 PM

## 2015-06-11 NOTE — ED Notes (Signed)
Pt currently receiving meds for infection. Pt states he does not have any support system to help him and he cannot afford his meds. Pt c/o back pain, nausea, and headache.

## 2015-06-11 NOTE — Progress Notes (Addendum)
Patient refused to have BP taken at this time due to being in pain, did agree to having temp taken which was 98.2.  Patient still in 10/10 pain.  NP made aware, help with repositioning offered but declined.  Patient also complaining of IV beeping.  Explained to patient that site becomes occluded when bending his arm which cause the alarm, offered to restart IV, but patient declines at this time.

## 2015-06-11 NOTE — ED Provider Notes (Signed)
CSN: [id number]     Arrival date & time 06/11/15  0216 History   First MD Initiated Contact with Patient 06/11/15 0251     Chief Complaint  Patient presents with  . multiple complaints      (Consider location/radiation/quality/duration/timing/severity/associated sxs/prior Treatment) The history is provided by the patient and a friend.   39 year old male is currently getting intravenous vancomycin through a PICC line to treat a psoas abscess. He started having hematuria yesterday which has persisted. Today, he had severe chills on 3 occasions and had subjective fever. He is complaining of pain in his left lower abdomen at the site of his abscess. Drains had been removed. He is also complaining of pain in his legs which had been diagnosed as sciatica. There is apparently problem and that he has not been able to afford his medications - especially his Neurontin.  Past Medical History  Diagnosis Date  . H/O back injury   . MRSA (methicillin resistant Staphylococcus aureus)   . 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 MRSA bacteremia 04/2015   Past Surgical History  Procedure Laterality Date  . Skin biopsy    . Abdmoninal drain insertion     Family History  Problem Relation Age of Onset  . Cancer Father    History  Substance Use Topics  . Smoking status: Current Every Day Smoker -- 0.50 packs/day for 20 years    Types: Cigarettes  . Smokeless tobacco: Never Used  . Alcohol Use: No    Review of Systems  All other systems reviewed and are negative.     Allergies  Vicodin and Adhesive  Home Medications   Prior to Admission medications   Medication Sig Start Date End Date Taking? Authorizing Provider  acetaminophen-codeine (TYLENOL #3) 300-30 MG per tablet Take 1 tablet by mouth every 12 (twelve) hours as needed for moderate pain. Patient not taking: Reported on 06/02/2015 05/28/15   Doris Cheadle, MD  amoxicillin-clavulanate (AUGMENTIN) 875-125 MG per tablet Take 1 tablet by mouth  2 (two) times daily. One po bid x 7 days 06/09/15   Arby Barrette, MD  diphenhydramine-acetaminophen (TYLENOL PM) 25-500 MG TABS Take 1 tablet by mouth at bedtime as needed (SLEEP).     Historical Provider, MD  docusate sodium (COLACE) 100 MG capsule Take 1 capsule (100 mg total) by mouth 2 (two) times daily. Patient not taking: Reported on 05/28/2015 05/23/15   Osvaldo Shipper, MD  doxycycline (VIBRAMYCIN) 100 MG capsule Take 1 capsule (100 mg total) by mouth 2 (two) times daily. One po bid x 7 days 06/09/15   Arby Barrette, MD  gabapentin (NEURONTIN) 300 MG capsule Take 1 capsule (300 mg total) by mouth 3 (three) times daily. Patient not taking: Reported on 06/09/2015 05/28/15   Doris Cheadle, MD  methocarbamol (ROBAXIN) 500 MG tablet Take 1 tablet (500 mg total) by mouth every 8 (eight) hours as needed for muscle spasms. 05/23/15   Osvaldo Shipper, MD  metroNIDAZOLE (FLAGYL) 500 MG tablet Take 1 tablet (500 mg total) by mouth 2 (two) times daily. One po bid x 7 days 06/09/15   Arby Barrette, MD  Oxycodone HCl 10 MG TABS Take 1 tablet (10 mg total) by mouth every 4 (four) hours as needed for severe pain. 06/02/15   Glynn Octave, MD  oxyCODONE-acetaminophen (PERCOCET) 5-325 MG per tablet Take 1-2 tablets by mouth every 4 (four) hours as needed. 06/09/15   Arby Barrette, MD  polyethylene glycol (MIRALAX / GLYCOLAX) packet Take 17  g by mouth 2 (two) times daily. May change to as needed once with regular bowel movements. Patient not taking: Reported on 05/28/2015 05/23/15   Osvaldo ShipperGokul Krishnan, MD  senna (SENOKOT) 8.6 MG TABS tablet Take 1 tablet (8.6 mg total) by mouth at bedtime. Patient not taking: Reported on 05/28/2015 05/23/15   Osvaldo ShipperGokul Krishnan, MD  traMADol (ULTRAM) 50 MG tablet Take 2 tablets (100 mg total) by mouth every 6 (six) hours. Patient not taking: Reported on 06/02/2015 05/23/15   Osvaldo ShipperGokul Krishnan, MD  vancomycin 1,500 mg in sodium chloride 0.9 % 500 mL Inject 1,500 mg into the vein every 12 (twelve) hours. For  3 more weeks 05/23/15   Osvaldo ShipperGokul Krishnan, MD   BP 147/92 mmHg  Pulse 114  Temp(Src) 100.7 F (38.2 C) (Oral)  Resp 20  Ht 6' (1.829 m)  Wt 160 lb (72.576 kg)  BMI 21.70 kg/m2  SpO2 99% Physical Exam  Nursing note and vitals reviewed.  39 year old male, resting comfortably and in no acute distress. Vital signs are significant for fever, tachycardia, and hypertension. Oxygen saturation is 99%, which is normal. he is sleeping soundly and snoring and is difficult to wake up. However, when awakened, he is able to converse appropriately. Head is normocephalic and atraumatic. PERRLA, EOMI. Oropharynx is clear. Neck is nontender and supple without adenopathy or JVD. Back is nontender and there is no CVA tenderness. Lungs are clear without rales, wheezes, or rhonchi. Chest is nontender. Heart has regular rate and rhythm without murmur. Abdomen is soft, flat, nontender without masses or hepatosplenomegaly and peristalsis is normoactive. to scars are present in the left lower quadrant which are apparently the site of the drains that had been removed. There is no erythema and no drainage. Extremities have no cyanosis or edema, full range of motion is present. PICC line is present in the left antecubital space. Skin is warm and dry without rash. Neurologic: Mental status is normal, cranial nerves are intact, there are no motor or sensory deficits.  ED Course  Procedures (including critical care time) Labs Review Results for orders placed or performed during the hospital encounter of 06/11/15  Culture, blood (routine x 2)  Result Value Ref Range   Specimen Description BLOOD PICC    Special Requests BOTTLES DRAWN AEROBIC ONLY 8CC    Culture PENDING    Report Status PENDING   Culture, blood (routine x 2)  Result Value Ref Range   Specimen Description BLOOD LEFT ANTECUBITAL    Special Requests      BOTTLES DRAWN AEROBIC AND ANAEROBIC AEB 8CC ANA 10CC   Culture PENDING    Report Status PENDING    CBC with Differential  Result Value Ref Range   WBC 11.4 (H) 4.0 - 10.5 K/uL   RBC 3.12 (L) 4.22 - 5.81 MIL/uL   Hemoglobin 8.4 (L) 13.0 - 17.0 g/dL   HCT 40.925.4 (L) 81.139.0 - 91.452.0 %   MCV 81.4 78.0 - 100.0 fL   MCH 26.9 26.0 - 34.0 pg   MCHC 33.1 30.0 - 36.0 g/dL   RDW 78.213.7 95.611.5 - 21.315.5 %   Platelets 243 150 - 400 K/uL   Neutrophils Relative % 85 (H) 43 - 77 %   Neutro Abs 9.6 (H) 1.7 - 7.7 K/uL   Lymphocytes Relative 8 (L) 12 - 46 %   Lymphs Abs 0.9 0.7 - 4.0 K/uL   Monocytes Relative 7 3 - 12 %   Monocytes Absolute 0.8 0.1 - 1.0 K/uL  Eosinophils Relative 0 0 - 5 %   Eosinophils Absolute 0.0 0.0 - 0.7 K/uL   Basophils Relative 0 0 - 1 %   Basophils Absolute 0.0 0.0 - 0.1 K/uL  Comprehensive metabolic panel  Result Value Ref Range   Sodium 139 135 - 145 mmol/L   Potassium 2.7 (LL) 3.5 - 5.1 mmol/L   Chloride 104 101 - 111 mmol/L   CO2 27 22 - 32 mmol/L   Glucose, Bld 143 (H) 65 - 99 mg/dL   BUN 12 6 - 20 mg/dL   Creatinine, Ser 1.61 (H) 0.61 - 1.24 mg/dL   Calcium 8.2 (L) 8.9 - 10.3 mg/dL   Total Protein 7.0 6.5 - 8.1 g/dL   Albumin 2.6 (L) 3.5 - 5.0 g/dL   AST 12 (L) 15 - 41 U/L   ALT 11 (L) 17 - 63 U/L   Alkaline Phosphatase 163 (H) 38 - 126 U/L   Total Bilirubin 0.6 0.3 - 1.2 mg/dL   GFR calc non Af Amer 58 (L) >60 mL/min   GFR calc Af Amer >60 >60 mL/min   Anion gap 8 5 - 15  Urinalysis, Routine w reflex microscopic (not at [facility])  Result Value Ref Range   Color, Urine RED (A) YELLOW   APPearance CLEAR CLEAR   Specific Gravity, Urine <1.005 (L) 1.005 - 1.030   pH 6.5 5.0 - 8.0   Glucose, UA NEGATIVE NEGATIVE mg/dL   Hgb urine dipstick LARGE (A) NEGATIVE   Bilirubin Urine NEGATIVE NEGATIVE   Ketones, ur NEGATIVE NEGATIVE mg/dL   Protein, ur TRACE (A) NEGATIVE mg/dL   Urobilinogen, UA 0.2 0.0 - 1.0 mg/dL   Nitrite NEGATIVE NEGATIVE   Leukocytes, UA NEGATIVE NEGATIVE  Urine microscopic-add on  Result Value Ref Range   Squamous Epithelial / LPF RARE RARE   WBC,  UA 0-2 <3 WBC/hpf   RBC / HPF TOO NUMEROUS TO COUNT <3 RBC/hpf   Bacteria, UA FEW (A) RARE  I-Stat CG4 Lactic Acid, ED  Result Value Ref Range   Lactic Acid, Venous 0.67 0.5 - 2.0 mmol/L   Imaging Review Ct Abdomen Pelvis W Contrast  06/09/2015   CLINICAL DATA:  38 year old male with history of left psoas abscess and drainage presenting with abdominal pain  EXAM: CT ABDOMEN AND PELVIS WITH CONTRAST  TECHNIQUE: Multidetector CT imaging of the abdomen and pelvis was performed using the standard protocol following bolus administration of intravenous contrast.  CONTRAST:  OMNIPAQUE IOHEXOL 300 MG/ML  SOLN  COMPARISON:  CT dated 05/22/2015 and 06/02/2015  FINDINGS: The visualized lung bases are clear. No intra-abdominal free air or free fluid.  Trace pericholecystic fluid may be present. There is mild periportal edema. The liver is otherwise unremarkable. No gallstone identified. The pancreas appears unremarkable. Splenomegaly measuring up to 16 cm. The adrenal glands, kidneys, visualized ureters and urinary bladder appear unremarkable. The prostate and seminal vesicles are grossly unremarkable.  Moderate stool throughout the colon. No evidence of bowel obstruction or inflammation. Normal appendix.  The visualized abdominal aorta and IVC are patent. No portal venous gas identified. There is no tendinopathy. Is there is diffuse haziness of the retroperitoneal and periaortic fat planes.  There is inflammatory changes of the left psoas and iliacus muscles. there has been interval removal of the left pelvic wall pigtail drainage catheter. A 1.3 x 1.0 cm hypodense focus noted in the left iliacus muscle (series 201, image 62) may represent residual fluid. There is overall interval improvement of the inflammatory  changes of the left pelvic sidewall. No drainable collection/abscess identified.  There is irregularity of the left sacroiliac joint compatible with sacroiliitis which is new compared to the study dated  05/22/2015 and progressed since the study 06/02/2015. There is degenerative changes of the spine. No acute fracture. Mild diffuse subcutaneous soft tissue stranding noted.  IMPRESSION: Interval abdominal of the left pelvic side hole drainage catheter with overall improvement of the inflammatory changes of the left psoas and iliacus muscles. Small stable hypodense focus in the superior aspect of the left iliacus muscle likely represents residual fluid. No drainable fluid collection/ abscess identified.  Irregularity of the left SI joint, with interval progression compared the prior study concerning for sacroiliitis. Clinical correlation is recommended.   Electronically Signed   By: Elgie Collard M.D.   On: 06/09/2015 22:33   Dg Chest Port 1 View  06/11/2015   CLINICAL DATA:  Acute onset of nausea, upper back pain and headache. Initial encounter.  EXAM: PORTABLE CHEST - 1 VIEW  COMPARISON:  Chest radiograph performed 05/15/2015  FINDINGS: The lungs are well-aerated. Mild bibasilar opacities may reflect atelectasis or possibly mild pneumonia. There is no evidence of pleural effusion or pneumothorax.  The cardiomediastinal silhouette is within normal limits. No acute osseous abnormalities are seen. A left PICC is noted ending about the cavoatrial junction.  IMPRESSION: Mild bibasilar airspace opacities may reflect atelectasis or possibly mild pneumonia.   Electronically Signed   By: Roanna Raider M.D.   On: 06/11/2015 04:50    MDM   Final diagnoses:  Fever, unspecified fever cause  Acute kidney injury (nontraumatic)  Hypokalemia  Elevated alkaline phosphatase level  Normochromic normocytic anemia   Fever in patient with known iliopsoas abscess currently on anabolic treatment. Old records are reviewed and the abscess had grown MRSA. He has had several ED visits since discharge and the most recent one was July 18 at which time CT scan showed no drainable collection of fluid. Today, he is febrile and  tachycardic and will get workup to evaluate for possible sepsis. His friend is concerned that he is not eating and is losing weight. Prior laboratory testing has shown low albumen consistent with poor nutrition. Report of hematuria will be checked by obtaining urinalysis.  Laboratory workup is significant for rising creatinine. Creatinine is up to 1.5 compared with 0.792 weeks ago. He is also markedly hypokalemic and is given both oral and intravenous potassium. Lactic acid level is normal. Chronic anemia and chronic elevation of alkaline phosphatase unchanged from baseline. I'm concerned that the rising creatinine could reflect renal toxicity from vancomycin. He is given an intravenous fluid bolus. Case is discussed with Dr. Selena Batten of [facility] agrees to admit the patient under observation status.  Dione Booze, MD 06/11/15 [phone]

## 2015-06-11 NOTE — ED Notes (Signed)
CRITICAL VALUE ALERT  Critical value received: potassium 2.7  Date of notification:  06/11/2015  Time of notification:  0420  Critical value read back:Yes.    Nurse who received alert:  Neldon Mcina Whitney Hillegass RN  MD notified (1st page):  Dr. Preston FleetingGlick   Time of first page:  [phone]

## 2015-06-11 NOTE — Consult Note (Signed)
Patient ID: [name], male   DOB: Nov 17, 1976, 39 y.o.   MRN: [id number]   Chief Complaint  Patient presents with  . multiple complaints     PAIN LEFT LEG AND LEFT SI JOINT   [name] is a [age] y.o. male.   HPI 39 year old male admitted in the recent past for left psoas abscess. Had radiographically guided drainage and was placed on IV and a box with ID input  He was discharged home presented back to our ER with further complaints of pain. CT scan was done and showed questionable SI joint involvement. MRI today shows fluid re-collection near the psoas area as well as SI joint inflammation consistent with SI joint infection.  Patient complains of left SI joint pain and radiating pain down his left leg into his left foot without numbness tingling or muscle weakness. He does hold the leg in flexion at the hip and knee for relief and cannot lie on his left side and has palpable tenderness there  Malaise, chills. No motor loss or weakness of the left leg. Rashes none   Review of Systems See hpi  Past Medical History  Diagnosis Date  . H/O back injury   . MRSA (methicillin resistant Staphylococcus aureus)   . 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 MRSA bacteremia 04/2015    Allergies  Allergen Reactions  . Vicodin [Hydrocodone-Acetaminophen] Hives and Itching  . Adhesive [Tape] Rash    Current Facility-Administered Medications  Medication Dose Route Frequency Provider Last Rate Last Dose  . 0.9 %  sodium chloride infusion  1,000 mL Intravenous Continuous Dione Boozeavid Glick, MD 125 mL/hr at 06/11/15 0908 1,000 mL at 06/11/15 0908  . 0.9 %  sodium chloride infusion   Intravenous STAT Dione Boozeavid Glick, MD 150 mL/hr at 06/11/15 0709    . 0.9 %  sodium chloride infusion  250 mL Intravenous PRN Pearson GrippeJames Kim, MD      . diphenhydrAMINE (BENADRYL) capsule 25 mg  25 mg Oral QHS PRN Standley Brookinganiel P Goodrich, MD       And  . acetaminophen (TYLENOL) tablet 500 mg  500 mg Oral QHS PRN Standley Brookinganiel P Goodrich, MD       . docusate sodium (COLACE) capsule 100 mg  100 mg Oral BID Pearson GrippeJames Kim, MD   100 mg at 06/11/15 1000  . enoxaparin (LOVENOX) injection 40 mg  40 mg Subcutaneous Q24H Pearson GrippeJames Kim, MD   40 mg at 06/11/15 0600  . gabapentin (NEURONTIN) capsule 300 mg  300 mg Oral TID Pearson GrippeJames Kim, MD   300 mg at 06/11/15 0908  . levofloxacin (LEVAQUIN) IVPB 750 mg  750 mg Intravenous Q24H Standley Brookinganiel P Goodrich, MD   750 mg at 06/11/15 [id number]  . methocarbamol (ROBAXIN) tablet 500 mg  500 mg Oral Q8H PRN Pearson GrippeJames Kim, MD      . oxyCODONE (Oxy IR/ROXICODONE) immediate release tablet 10 mg  10 mg Oral Q4H PRN Pearson GrippeJames Kim, MD   10 mg at 06/11/15 0814  . oxyCODONE-acetaminophen (PERCOCET/ROXICET) 5-325 MG per tablet 1-2 tablet  1-2 tablet Oral Q4H PRN Pearson GrippeJames Kim, MD      . piperacillin-tazobactam (ZOSYN) IVPB 3.375 g  3.375 g Intravenous Q8H Standley Brookinganiel P Goodrich, MD   3.375 g at 06/11/15 0909  . polyethylene glycol (MIRALAX / GLYCOLAX) packet 17 g  17 g Oral BID Pearson GrippeJames Kim, MD   17 g at 06/11/15 1000  . senna (SENOKOT) tablet 8.6 mg  1 tablet Oral QHS Pearson GrippeJames Kim,  MD      . sodium chloride 0.9 % injection 3 mL  3 mL Intravenous Q12H Pearson Grippe, MD   3 mL at 06/11/15 0910  . sodium chloride 0.9 % injection 3 mL  3 mL Intravenous Q12H Pearson Grippe, MD   0 mL at 06/11/15 1000  . sodium chloride 0.9 % injection 3 mL  3 mL Intravenous PRN Pearson Grippe, MD      . vancomycin (VANCOCIN) 1,250 mg in sodium chloride 0.9 % 250 mL IVPB  1,250 mg Intravenous Q12H Standley Brooking, MD      . vancomycin (VANCOCIN) 1,500 mg in sodium chloride 0.9 % 500 mL IVPB  1,500 mg Intravenous Once Standley Brooking, MD         Physical Exam Blood pressure 130/94, pulse 81, temperature 100.7 F (38.2 C), temperature source Oral, resp. rate 20, height 6' (1.829 m), weight 160 lb (72.576 kg), SpO2 98 %. Physical Exam  The patient is well developed well nourished and well groomed.   Orientation to person place and time is normal   Mood is pleasant.  Ambulatory status  not observed   Inspection: Left SI joint left buttock tenderness  ROM: Hip is held in flexion knee is held in flexion is normal dorsiflexion plantar flexion left foot  STABILITY: test deferred   Motor exam: normal right and left leg   Skin: no rashes apreciated   Neuro: pain with straightening of left leg increases   Vascular: 2+ pulse   Lymph: deferred    Data Reviewed prior MRI and today's MRI prior CT  Basically as stated in the history of present illness he has fluid reaccumulation and he has increased uptake in the SI joint.  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 Minimal residual dorsal epidural enhancement at the site of prior phlegmon has decreased from prior.   L5-S1: Mild circumferential disc bulging, broad central disc protrusion, and mild facet hypertrophy result in mild right greater than left lateral recess stenosis and mild bilateral neural foraminal stenosis, unchanged. No spinal stenosis.   Marrow edema about the left SI joint involving the left sacral ala and iliac bone does not appear significantly different than on the prior MRI. Erosive changes and fluid are again noted in the left SI joint. Edema is again seen in the soft tissues surrounding the left SI joint including the iliopsoas and gluteal musculature, lower lumbar paraspinal musculature, and other surrounding soft tissues.   Fluid collection at the superior aspect of the left iliacus muscle has enlarged from the prior MRI, now measuring 2.1 x 1.9 cm (previously 1.4 x 0.8 cm). Fluid tracks inferiorly from  this collection along the anterior aspect of the left SI joint, and there is a new 1.2 cm fluid collection just superior to the left pelvic sidewall posterior to the left external iliac vein (series 7, image 49). Extensive surrounding phlegmon is again seen about the left iliopsoas musculature. Trace free fluid is noted in the right mid abdomen near the tip of the liver, grossly stable to minimally increased compared to that on prior CT.   IMPRESSION: 1. Persistent extensive marrow and soft tissue edema about the  left SI joint consistent with septic arthritis and surrounding myositis and soft tissue phlegmon. 2. 2.1 cm fluid collection/abscess at the superior aspect of the left iliacus, larger than on prior MRI. 3. New 1.2 cm collection just superior to the left pelvic sidewall. 4. No evidence of new infection in the lumbar spine. Minimal residual dorsal epidural enhancement at L4-5.     Electronically Signed   By: Sebastian Ache   On: 06/11/2015 14:01

## 2015-06-11 NOTE — Progress Notes (Signed)
[facility] PROGRESS NOTE  CORDALE MANERA ZOX:[id number] DOB: [birth year] DOA: 06/11/2015 PCP: Doris Cheadle, MD  Assessment/Plan:  Intractable pain: low back and left leg. 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 Exam consistent with imaging. Hx of same with drains. Will await IR recommendations. May need to redrain given worsening fluid/abscess. Will provide oxycodone, robaxin, gabapentin and diluadid for breakthrough.  Continue Vanc and levaquin day #2. Currently he is afebrile and non-toxic. Hemodynamically stable. Monitor closely  Psoas abscess: worsening per MRI. Cont vanco, blood culture no growth to date. IR to evaluate for need for redrain or other intervention. Will hold lovenox and make NPO past midnight in anticipation of procedure. Evaluated by ortho  Fever: likely related to above. Chest xray with atelectasis vs mild pneumonia, urine with negative leukocytes and few bacteria 0-2 WBC. Mild leukocytosis.  Hx MRSA bacteremia as well as #2. Lactic acid 0.67. Blood cultures no growth to date. Antibiotics as above. Currently hemodynamically stable not toxic appearing.  See #2.   Acute kidney injury: likely related to decreased oral intake in setting of above.  Has been on Vanc for several weeks but chart review indicates creatinine with in limits of normal 2 weeks ago. Will hold nephrotoxin, monitor urine output provide IV fluids and recheck in am.  Hematuria: per urinalysis. Chart review indicates present since 04/2015. Etiology uncertain. Urine output fair. Hg 8.4. Chart review indicates Hg 10.1 06/02/15. Consider renal US.   Anemia: likely acute blood loss related to above. Will obtain anemia panel. Monitor CBC. Consider transfusion if Hg continues to trend downward.   Tachycardia: related to #1 and #2 and #6. Troponin negative. Resolved today. Continue to monitor  Hypokalemia: repleted and resolved. Will monitor.  1.   Code Status: full Family Communication: none  present Disposition Plan: home when ready   Consultants:  IR per phone  ortho  Procedures:  none  Antibiotics:  Vancomycin: 06/10/2015>>  levaquin 06/10/2015>>  HPI/Subjective: Lying on right side. Reports pain better controlled with IV pain med. Reports pain lower left back to thigh  Objective: Filed Vitals:   06/11/15 1511  BP: 145/101  Pulse: 76  Temp: 98.1 F (36.7 C)  Resp: 18    Intake/Output Summary (Last 24 hours) at 06/11/15 1626 Last data filed at 06/11/15 1200  Gross per 24 hour  Intake 1056.67 ml  Output    450 ml  Net 606.67 ml   Filed Weights   06/11/15 0234  Weight: 72.576 kg (160 lb)    Exam:   General:  Appears uncomfortable   Cardiovascular: RRR no MGR No LE edema  Respiratory: normal effort BS clear bilaterally no crackles  Abdomen: soft +BS non-tender to palpation  Musculoskeletal: no clubbing or cyanosis. Unable to lay on left side. Lower back tender to touch.    Data Reviewed: Basic Metabolic Panel:  Recent Labs Lab 06/05/15 1840 06/09/15 1848 06/11/15 0345 06/11/15 1501  NA  --  139 139 143  K  --  3.2* 2.7* 3.9  CL  --  101 104 110  CO2  --  26 27 26   GLUCOSE  --  104* 143* 96  BUN 16 11 12 11   CREATININE 1.14 1.20 1.51* 1.70*  CALCIUM  --  8.3* 8.2* 8.1*   Liver Function Tests:  Recent Labs Lab 06/09/15 1848 06/11/15 0345  AST 12* 12*  ALT 13* 11*  ALKPHOS 164* 163*  BILITOT 0.5 0.6  PROT 6.5 7.0  ALBUMIN 2.5* 2.6*  Recent Labs Lab 06/09/15 1848  LIPASE 16*   No results for input(s): AMMONIA in the last 168 hours. CBC:  Recent Labs Lab 06/09/15 1848 06/11/15 0345  WBC 8.4 11.4*  NEUTROABS  --  9.6*  HGB 8.1* 8.4*  HCT 25.6* 25.4*  MCV 81.3 81.4  PLT 248 243   Cardiac Enzymes:  Recent Labs Lab 06/11/15 0634  TROPONINI <0.03   BNP (last 3 results) No results for input(s): BNP in the last 8760 hours.  ProBNP (last 3 results) No results for input(s): PROBNP in the last 8760  hours.  CBG: No results for input(s): GLUCAP in the last 168 hours.  Recent Results (from the past 240 hour(s))  Blood culture (routine x 2)     Status: None   Collection Time: 06/02/15  4:20 PM  Result Value Ref Range Status   Specimen Description BLOOD RIGHT ANTECUBITAL  Final   Special Requests BOTTLES DRAWN AEROBIC AND ANAEROBIC 6CC  Final   Culture NO GROWTH 7 DAYS  Final   Report Status 06/09/2015 FINAL  Final  Blood culture (routine x 2)     Status: None   Collection Time: 06/02/15  4:27 PM  Result Value Ref Range Status   Specimen Description BLOOD LEFT HAND  Final   Special Requests BOTTLES DRAWN AEROBIC AND ANAEROBIC 6CC  Final   Culture NO GROWTH 7 DAYS  Final   Report Status 06/09/2015 FINAL  Final  Urine culture     Status: None   Collection Time: 06/09/15  7:47 PM  Result Value Ref Range Status   Specimen Description URINE, CLEAN CATCH  Final   Special Requests NONE  Final   Culture NO GROWTH 1 DAY  Final   Report Status 06/11/2015 FINAL  Final  Culture, blood (routine x 2)     Status: None (Preliminary result)   Collection Time: 06/11/15  3:45 AM  Result Value Ref Range Status   Specimen Description BLOOD LEFT ANTECUBITAL DRAWN BY RN  Final   Special Requests   Final    BOTTLES DRAWN AEROBIC AND ANAEROBIC AEB=8CC ANA=10CC   Culture NO GROWTH < 12 HOURS  Final   Report Status PENDING  Incomplete  Culture, blood (routine x 2)     Status: None (Preliminary result)   Collection Time: 06/11/15  3:55 AM  Result Value Ref Range Status   Specimen Description BLOOD PICC LINE DRAWN BY RN TT  Final   Special Requests BOTTLES DRAWN AEROBIC ONLY 8CC  Final   Culture NO GROWTH < 12 HOURS  Final   Report Status PENDING  Incomplete     Studies: Mr Lumbar Spine W Wo Contrast  06/11/2015   CLINICAL DATA:  Fever. Continued low back pain. History of abscess drainage.  EXAM: MRI LUMBAR SPINE WITHOUT AND WITH CONTRAST  TECHNIQUE: Multiplanar and multiecho pulse sequences of the  lumbar spine were obtained without and with intravenous contrast.  CONTRAST:  15mL MULTIHANCE GADOBENATE DIMEGLUMINE 529 MG/ML IV SOLN  COMPARISON:  06/02/2015 lumbar spine MRI. 06/09/2015 CT abdomen and pelvis.  FINDINGS: Vertebral alignment is unchanged with slight retrolisthesis of L5 on S1. Vertebral body heights are preserved. Disc desiccation and mild disc space narrowing are present at L4-5 and L5-S1, similar to prior. No vertebral marrow edema is seen in the lumbar spine. Conus medullaris is normal in signal and terminates at L1.  T11-12: Only imaged sagittally. Mild disc bulging results in at most mild spinal canal and neural foraminal narrowing, unchanged.  T12-L1:  Only imaged sagittally.  No disc herniation or stenosis.  L1-2 through L3-4:  Negative.  L4-5: Mild circumferential disc bulging, small superimposed central disc protrusion, and mild facet hypertrophy result in minimal spinal canal narrowing and minimal bilateral neural foraminal narrowing, unchanged. Minimal residual dorsal epidural enhancement at the site of prior phlegmon has decreased from prior.  L5-S1: Mild circumferential disc bulging, broad central disc protrusion, and mild facet hypertrophy result in mild right greater than left lateral recess stenosis and mild bilateral neural foraminal stenosis, unchanged. No spinal stenosis.  Marrow edema about the left SI joint involving the left sacral ala and iliac bone does not appear significantly different than on the prior MRI. Erosive changes and fluid are again noted in the left SI joint. Edema is again seen in the soft tissues surrounding the left SI joint including the iliopsoas and gluteal musculature, lower lumbar paraspinal musculature, and other surrounding soft tissues.  Fluid collection at the superior aspect of the left iliacus muscle has enlarged from the prior MRI, now measuring 2.1 x 1.9 cm (previously 1.4 x 0.8 cm). Fluid tracks inferiorly from this collection along the  anterior aspect of the left SI joint, and there is a new 1.2 cm fluid collection just superior to the left pelvic sidewall posterior to the left external iliac vein (series 7, image 49). Extensive surrounding phlegmon is again seen about the left iliopsoas musculature. Trace free fluid is noted in the right mid abdomen near the tip of the liver, grossly stable to minimally increased compared to that on prior CT.  IMPRESSION: 1. Persistent extensive marrow and soft tissue edema about the left SI joint consistent with septic arthritis and surrounding myositis and soft tissue phlegmon. 2. 2.1 cm fluid collection/abscess at the superior aspect of the left iliacus, larger than on prior MRI. 3. New 1.2 cm collection just superior to the left pelvic sidewall. 4. No evidence of new infection in the lumbar spine. Minimal residual dorsal epidural enhancement at L4-5.   Electronically Signed   By: Sebastian Ache   On: 06/11/2015 14:01   Ct Abdomen Pelvis W Contrast  06/09/2015   CLINICAL DATA:  39 year old male with history of left psoas abscess and drainage presenting with abdominal pain  EXAM: CT ABDOMEN AND PELVIS WITH CONTRAST  TECHNIQUE: Multidetector CT imaging of the abdomen and pelvis was performed using the standard protocol following bolus administration of intravenous contrast.  CONTRAST:  OMNIPAQUE IOHEXOL 300 MG/ML  SOLN  COMPARISON:  CT dated 05/22/2015 and 06/02/2015  FINDINGS: The visualized lung bases are clear. No intra-abdominal free air or free fluid.  Trace pericholecystic fluid may be present. There is mild periportal edema. The liver is otherwise unremarkable. No gallstone identified. The pancreas appears unremarkable. Splenomegaly measuring up to 16 cm. The adrenal glands, kidneys, visualized ureters and urinary bladder appear unremarkable. The prostate and seminal vesicles are grossly unremarkable.  Moderate stool throughout the colon. No evidence of bowel obstruction or inflammation. Normal  appendix.  The visualized abdominal aorta and IVC are patent. No portal venous gas identified. There is no tendinopathy. Is there is diffuse haziness of the retroperitoneal and periaortic fat planes.  There is inflammatory changes of the left psoas and iliacus muscles. there has been interval removal of the left pelvic wall pigtail drainage catheter. A 1.3 x 1.0 cm hypodense focus noted in the left iliacus muscle (series 201, image 62) may represent residual fluid. There is overall interval improvement of the inflammatory changes of  the left pelvic sidewall. No drainable collection/abscess identified.  There is irregularity of the left sacroiliac joint compatible with sacroiliitis which is new compared to the study dated 05/22/2015 and progressed since the study 06/02/2015. There is degenerative changes of the spine. No acute fracture. Mild diffuse subcutaneous soft tissue stranding noted.  IMPRESSION: Interval abdominal of the left pelvic side hole drainage catheter with overall improvement of the inflammatory changes of the left psoas and iliacus muscles. Small stable hypodense focus in the superior aspect of the left iliacus muscle likely represents residual fluid. No drainable fluid collection/ abscess identified.  Irregularity of the left SI joint, with interval progression compared the prior study concerning for sacroiliitis. Clinical correlation is recommended.   Electronically Signed   By: Elgie CollardArash  Radparvar M.D.   On: 06/09/2015 22:33   Dg Chest Port 1 View  06/11/2015   CLINICAL DATA:  Acute onset of nausea, upper back pain and headache. Initial encounter.  EXAM: PORTABLE CHEST - 1 VIEW  COMPARISON:  Chest radiograph performed 05/15/2015  FINDINGS: The lungs are well-aerated. Mild bibasilar opacities may reflect atelectasis or possibly mild pneumonia. There is no evidence of pleural effusion or pneumothorax.  The cardiomediastinal silhouette is within normal limits. No acute osseous abnormalities are  seen. A left PICC is noted ending about the cavoatrial junction.  IMPRESSION: Mild bibasilar airspace opacities may reflect atelectasis or possibly mild pneumonia.   Electronically Signed   By: Roanna RaiderJeffery  Chang M.D.   On: 06/11/2015 04:50    Scheduled Meds: . sodium chloride   Intravenous STAT  . docusate sodium  100 mg Oral BID  . gabapentin  300 mg Oral TID  . levofloxacin (LEVAQUIN) IV  750 mg Intravenous Q24H  . piperacillin-tazobactam (ZOSYN)  IV  3.375 g Intravenous Q8H  . polyethylene glycol  17 g Oral BID  . senna  1 tablet Oral QHS  . sodium chloride  3 mL Intravenous Q12H  . sodium chloride  3 mL Intravenous Q12H  . vancomycin  1,250 mg Intravenous Q12H   Continuous Infusions: . sodium chloride 1,000 mL (06/11/15 0908)    Active Problems:   Iliopsoas abscess   MRSA bacteremia   Acute kidney injury (nontraumatic)   Fever   Anemia   Acute renal failure   Hypokalemia   H/O back injury   Intractable pain    Time spent: 40 minutes    [facility] MarshalltownBLACK,Honestee Revard M  [facility] Pager [phone]. If 7PM-7AM, please contact night-coverage at [email or web address], password [email or web address] 06/11/2015, 4:26 PM  LOS: 0 days

## 2015-06-11 NOTE — H&P (Addendum)
[name] is an 39 y.o. male.    Pcp: unassigned  Chief Complaint:  Pain in legs HPI: 39 yo male with recent admission 05/15/2015 for iliopsoas abscess  c/o sciatica in bilateral legs. Pt came to ER for evaluation .  Pt had low grade temp in ED, along with mild ARF, and hypokalemia. And anemia. + dry cough.  CXR showed ? Atelectasis vs pneumonia.   Pt will be admitted for w/up of fever.   Past Medical History  Diagnosis Date  . H/O back injury   . MRSA (methicillin resistant Staphylococcus aureus)   . 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 MRSA bacteremia 04/2015    Past Surgical History  Procedure Laterality Date  . Skin biopsy    . Abdmoninal drain insertion      Family History  Problem Relation Age of Onset  . Cancer Father    Social History:  reports that he has been smoking Cigarettes.  He has a 10 pack-year smoking history. He has never used smokeless tobacco. He reports that he does not drink alcohol or use illicit drugs.  Allergies:  Allergies  Allergen Reactions  . Vicodin [Hydrocodone-Acetaminophen] Hives and Itching  . Adhesive [Tape]    Medications reviewed  Results for orders placed or performed during the hospital encounter of 06/11/15 (from the past 48 hour(s))  Culture, blood (routine x 2)     Status: None (Preliminary result)   Collection Time: 06/11/15  3:45 AM  Result Value Ref Range   Specimen Description BLOOD LEFT ANTECUBITAL    Special Requests      BOTTLES DRAWN AEROBIC AND ANAEROBIC AEB 8CC ANA 10CC   Culture PENDING    Report Status PENDING   CBC with Differential     Status: Abnormal   Collection Time: 06/11/15  3:45 AM  Result Value Ref Range   WBC 11.4 (H) 4.0 - 10.5 K/uL   RBC 3.12 (L) 4.22 - 5.81 MIL/uL   Hemoglobin 8.4 (L) 13.0 - 17.0 g/dL   HCT 25.4 (L) 39.0 - 52.0 %   MCV 81.4 78.0 - 100.0 fL   MCH 26.9 26.0 - 34.0 pg   MCHC 33.1 30.0 - 36.0 g/dL   RDW 13.7 11.5 - 15.5 %   Platelets 243 150 - 400 K/uL   Neutrophils Relative %  85 (H) 43 - 77 %   Neutro Abs 9.6 (H) 1.7 - 7.7 K/uL   Lymphocytes Relative 8 (L) 12 - 46 %   Lymphs Abs 0.9 0.7 - 4.0 K/uL   Monocytes Relative 7 3 - 12 %   Monocytes Absolute 0.8 0.1 - 1.0 K/uL   Eosinophils Relative 0 0 - 5 %   Eosinophils Absolute 0.0 0.0 - 0.7 K/uL   Basophils Relative 0 0 - 1 %   Basophils Absolute 0.0 0.0 - 0.1 K/uL  Comprehensive metabolic panel     Status: Abnormal   Collection Time: 06/11/15  3:45 AM  Result Value Ref Range   Sodium 139 135 - 145 mmol/L   Potassium 2.7 (LL) 3.5 - 5.1 mmol/L    Comment: CRITICAL RESULT CALLED TO, READ BACK BY AND VERIFIED WITH: TALBOTT,T AT 4:20AM ON 06/11/15 BY FESTERMAN,C    Chloride 104 101 - 111 mmol/L   CO2 27 22 - 32 mmol/L   Glucose, Bld 143 (H) 65 - 99 mg/dL   BUN 12 6 - 20 mg/dL   Creatinine, Ser 1.51 (H) 0.61 - 1.24 mg/dL  Calcium 8.2 (L) 8.9 - 10.3 mg/dL   Total Protein 7.0 6.5 - 8.1 g/dL   Albumin 2.6 (L) 3.5 - 5.0 g/dL   AST 12 (L) 15 - 41 U/L   ALT 11 (L) 17 - 63 U/L   Alkaline Phosphatase 163 (H) 38 - 126 U/L   Total Bilirubin 0.6 0.3 - 1.2 mg/dL   GFR calc non Af Amer 58 (L) >60 mL/min   GFR calc Af Amer >60 >60 mL/min    Comment: (NOTE) The eGFR has been calculated using the CKD EPI equation. This calculation has not been validated in all clinical situations. eGFR's persistently <60 mL/min signify possible Chronic Kidney Disease.    Anion gap 8 5 - 15  Culture, blood (routine x 2)     Status: None (Preliminary result)   Collection Time: 06/11/15  3:55 AM  Result Value Ref Range   Specimen Description BLOOD PICC    Special Requests BOTTLES DRAWN AEROBIC ONLY 8CC    Culture PENDING    Report Status PENDING   I-Stat CG4 Lactic Acid, ED     Status: None   Collection Time: 06/11/15  3:59 AM  Result Value Ref Range   Lactic Acid, Venous 0.67 0.5 - 2.0 mmol/L  Urinalysis, Routine w reflex microscopic (not at [facility])     Status: Abnormal   Collection Time: 06/11/15  4:55 AM  Result Value Ref Range    Color, Urine RED (A) YELLOW    Comment: BIOCHEMICALS MAY BE AFFECTED BY COLOR   APPearance CLEAR CLEAR   Specific Gravity, Urine <1.005 (L) 1.005 - 1.030   pH 6.5 5.0 - 8.0   Glucose, UA NEGATIVE NEGATIVE mg/dL   Hgb urine dipstick LARGE (A) NEGATIVE   Bilirubin Urine NEGATIVE NEGATIVE   Ketones, ur NEGATIVE NEGATIVE mg/dL   Protein, ur TRACE (A) NEGATIVE mg/dL   Urobilinogen, UA 0.2 0.0 - 1.0 mg/dL   Nitrite NEGATIVE NEGATIVE   Leukocytes, UA NEGATIVE NEGATIVE  Urine microscopic-add on     Status: Abnormal   Collection Time: 06/11/15  4:55 AM  Result Value Ref Range   Squamous Epithelial / LPF RARE RARE   WBC, UA 0-2 <3 WBC/hpf   RBC / HPF TOO NUMEROUS TO COUNT <3 RBC/hpf   Bacteria, UA FEW (A) RARE   Ct Abdomen Pelvis W Contrast  06/09/2015   CLINICAL DATA:  39 year old male with history of left psoas abscess and drainage presenting with abdominal pain  EXAM: CT ABDOMEN AND PELVIS WITH CONTRAST  TECHNIQUE: Multidetector CT imaging of the abdomen and pelvis was performed using the standard protocol following bolus administration of intravenous contrast.  CONTRAST:  160m OMNIPAQUE IOHEXOL 300 MG/ML  SOLN  COMPARISON:  CT dated 05/22/2015 and 06/02/2015  FINDINGS: The visualized lung bases are clear. No intra-abdominal free air or free fluid.  Trace pericholecystic fluid may be present. There is mild periportal edema. The liver is otherwise unremarkable. No gallstone identified. The pancreas appears unremarkable. Splenomegaly measuring up to 16 cm. The adrenal glands, kidneys, visualized ureters and urinary bladder appear unremarkable. The prostate and seminal vesicles are grossly unremarkable.  Moderate stool throughout the colon. No evidence of bowel obstruction or inflammation. Normal appendix.  The visualized abdominal aorta and IVC are patent. No portal venous gas identified. There is no tendinopathy. Is there is diffuse haziness of the retroperitoneal and periaortic fat planes.  There  is inflammatory changes of the left psoas and iliacus muscles. there has been interval removal of the  left pelvic wall pigtail drainage catheter. A 1.3 x 1.0 cm hypodense focus noted in the left iliacus muscle (series 201, image 62) may represent residual fluid. There is overall interval improvement of the inflammatory changes of the left pelvic sidewall. No drainable collection/abscess identified.  There is irregularity of the left sacroiliac joint compatible with sacroiliitis which is new compared to the study dated 05/22/2015 and progressed since the study 06/02/2015. There is degenerative changes of the spine. No acute fracture. Mild diffuse subcutaneous soft tissue stranding noted.  IMPRESSION: Interval abdominal of the left pelvic side hole drainage catheter with overall improvement of the inflammatory changes of the left psoas and iliacus muscles. Small stable hypodense focus in the superior aspect of the left iliacus muscle likely represents residual fluid. No drainable fluid collection/ abscess identified.  Irregularity of the left SI joint, with interval progression compared the prior study concerning for sacroiliitis. Clinical correlation is recommended.   Electronically Signed   By: Anner Crete M.D.   On: 06/09/2015 22:33   Dg Chest Port 1 View  06/11/2015   CLINICAL DATA:  Acute onset of nausea, upper back pain and headache. Initial encounter.  EXAM: PORTABLE CHEST - 1 VIEW  COMPARISON:  Chest radiograph performed 05/15/2015  FINDINGS: The lungs are well-aerated. Mild bibasilar opacities may reflect atelectasis or possibly mild pneumonia. There is no evidence of pleural effusion or pneumothorax.  The cardiomediastinal silhouette is within normal limits. No acute osseous abnormalities are seen. A left PICC is noted ending about the cavoatrial junction.  IMPRESSION: Mild bibasilar airspace opacities may reflect atelectasis or possibly mild pneumonia.   Electronically Signed   By: Garald Balding  M.D.   On: 06/11/2015 04:50    Review of Systems  Constitutional: Positive for fever. Negative for chills, weight loss, malaise/fatigue and diaphoresis.  HENT: Negative.   Eyes: Negative.   Respiratory: Negative.   Cardiovascular: Negative.   Gastrointestinal: Negative.   Genitourinary: Negative.   Musculoskeletal: Negative.   Skin: Negative.   Neurological: Negative.  Negative for weakness.  Endo/Heme/Allergies: Negative.   Psychiatric/Behavioral: Negative.     Blood pressure 130/94, pulse 81, temperature 100.7 F (38.2 C), temperature source Oral, resp. rate 20, height 6' (1.829 m), weight 72.576 kg (160 lb), SpO2 98 %. Physical Exam  Constitutional: He is oriented to person, place, and time. He appears well-developed and well-nourished.  HENT:  Head: Normocephalic and atraumatic.  Mouth/Throat: No oropharyngeal exudate.  Eyes: Conjunctivae and EOM are normal. Pupils are equal, round, and reactive to light. No scleral icterus.  Neck: Normal range of motion. Neck supple. No JVD present. No tracheal deviation present. No thyromegaly present.  Cardiovascular: Normal rate and regular rhythm.  Exam reveals no gallop and no friction rub.   No murmur heard. Respiratory: Effort normal and breath sounds normal. No respiratory distress. He has no wheezes. He has no rales.  GI: Soft. Bowel sounds are normal. He exhibits no distension. There is no tenderness. There is no rebound and no guarding.  Musculoskeletal: Normal range of motion. He exhibits no edema or tenderness.  Lymphadenopathy:    He has no cervical adenopathy.  Neurological: He is alert and oriented to person, place, and time. He has normal reflexes. He displays normal reflexes. No cranial nerve deficit. He exhibits normal muscle tone. Coordination normal.  Skin: Skin is warm and dry. No rash noted. No erythema. No pallor.  No janeway, no olsers, no spinter hemorrhage  Psychiatric: He has a normal mood and affect.  His behavior  is normal. Judgment and thought content normal.     Assessment/Plan Fever ? hcap  Blood culture x2 pending Urinalysis pending Check esr,  Check MRI L spine   Tachycardia Check echocardiogram, check tsh Check trop  Hypokalemia Replete Check cmp in am  Anemia Check cbc in am  Psoas abscess, mrsa bacteremia Cont vanco, blood culture x2 pending  DVT prophylaxis: scd, lovenox Ninetta Adelstein 06/11/2015, 5:24 AM

## 2015-06-11 NOTE — Progress Notes (Signed)
ANTIBIOTIC CONSULT NOTE- follow up  Pharmacy Consult for Zosyn, levofloxacin, vancomycin Indication: pneumonia  Allergies  Allergen Reactions  . Vicodin [Hydrocodone-Acetaminophen] Hives and Itching  . Adhesive [Tape]    Patient Measurements: Height: 6' (182.9 cm) Weight: 160 lb (72.576 kg) IBW/kg (Calculated) : 77.6  Vital Signs: Temp: 100.7 F (38.2 C) (07/20 0234) Temp Source: Oral (07/20 0234) BP: 130/94 mmHg (07/20 0503) Pulse Rate: 81 (07/20 0503)  Labs:  Recent Labs  06/09/15 1848 06/11/15 0345  WBC 8.4 11.4*  HGB 8.1* 8.4*  PLT 248 243  CREATININE 1.20 1.51*   Estimated Creatinine Clearance: 69.4 mL/min (by C-G formula based on Cr of 1.51).  No results for input(s): VANCOTROUGH, VANCOPEAK, VANCORANDOM, GENTTROUGH, GENTPEAK, GENTRANDOM, TOBRATROUGH, TOBRAPEAK, TOBRARND, AMIKACINPEAK, AMIKACINTROU, AMIKACIN in the last 72 hours.   Microbiology: Recent Results (from the past 720 hour(s))  Culture, blood (routine x 2)     Status: None   Collection Time: 05/15/15  4:00 PM  Result Value Ref Range Status   Specimen Description BLOOD LEFT FOREARM DRAWN BY RN  Final   Special Requests   Final    BOTTLES DRAWN AEROBIC AND ANAEROBIC AEB=6CC ANA=4CC   Culture NO GROWTH 5 DAYS  Final   Report Status 05/20/2015 FINAL  Final  Urine culture     Status: None   Collection Time: 05/15/15  4:13 PM  Result Value Ref Range Status   Specimen Description URINE, CLEAN CATCH  Final   Special Requests NONE  Final   Culture   Final    >=100,000 COLONIES/mL METHICILLIN RESISTANT STAPHYLOCOCCUS AUREUS Performed at [facility]    Report Status 05/19/2015 FINAL  Final   Organism ID, Bacteria METHICILLIN RESISTANT STAPHYLOCOCCUS AUREUS  Final      Susceptibility   Methicillin resistant staphylococcus aureus - MIC*    CIPROFLOXACIN >=8 RESISTANT Resistant     GENTAMICIN <=0.5 SENSITIVE Sensitive     NITROFURANTOIN <=16 SENSITIVE Sensitive     OXACILLIN >=4 RESISTANT  Resistant     TETRACYCLINE <=1 SENSITIVE Sensitive     VANCOMYCIN 1 SENSITIVE Sensitive     TRIMETH/SULFA <=10 SENSITIVE Sensitive     CLINDAMYCIN <=0.25 SENSITIVE Sensitive     RIFAMPIN <=0.5 SENSITIVE Sensitive     Inducible Clindamycin NEGATIVE Sensitive     * >=100,000 COLONIES/mL METHICILLIN RESISTANT STAPHYLOCOCCUS AUREUS  Culture, blood (routine x 2)     Status: None   Collection Time: 05/15/15  4:25 PM  Result Value Ref Range Status   Specimen Description BLOOD RIGHT WRIST  Final   Special Requests BOTTLES DRAWN AEROBIC AND ANAEROBIC 8CC EACH  Final   Culture  Setup Time   Final    GRAM POSITIVE COCCI IN CLUSTERS IN BOTH AEROBIC AND ANAEROBIC BOTTLES CRITICAL RESULT CALLED TO, READ BACK BY AND VERIFIED WITH: HUDSON K. AT [facility] AT 1043A ON [id number] BY THOMPSON S.    Culture   Final    METHICILLIN RESISTANT STAPHYLOCOCCUS AUREUS Performed at [facility]    Report Status 05/18/2015 FINAL  Final   Organism ID, Bacteria METHICILLIN RESISTANT STAPHYLOCOCCUS AUREUS  Final      Susceptibility   Methicillin resistant staphylococcus aureus - MIC*    CIPROFLOXACIN >=8 RESISTANT Resistant     ERYTHROMYCIN >=8 RESISTANT Resistant     GENTAMICIN <=0.5 SENSITIVE Sensitive     OXACILLIN >=4 RESISTANT Resistant     TETRACYCLINE <=1 SENSITIVE Sensitive     VANCOMYCIN 1 SENSITIVE Sensitive     TRIMETH/SULFA <=10  SENSITIVE Sensitive     CLINDAMYCIN <=0.25 SENSITIVE Sensitive     RIFAMPIN <=0.5 SENSITIVE Sensitive     Inducible Clindamycin NEGATIVE Sensitive     * METHICILLIN RESISTANT STAPHYLOCOCCUS AUREUS  MRSA PCR Screening     Status: Abnormal   Collection Time: 05/16/15  9:08 AM  Result Value Ref Range Status   MRSA by PCR POSITIVE (A) NEGATIVE Final    Comment: RESULT CALLED TO, READ BACK BY AND VERIFIED WITH: C. HUDSON RN 10:50 05/16/15 ([email or web address])        The GeneXpert MRSA Assay (FDA approved for NASAL specimens only), is one component of a comprehensive MRSA  colonization surveillance program. It is not intended to diagnose MRSA infection nor to guide or monitor treatment for MRSA infections.   Culture, routine-abscess     Status: None   Collection Time: 05/16/15  3:47 PM  Result Value Ref Range Status   Specimen Description ABSCESS  Final   Special Requests   Final    CRANIAL ASPECT OF THE LEFT ILIOPSOAS ABSCESS SYRINGE 1   Gram Stain   Final    ABUNDANT WBC PRESENT,BOTH PMN AND MONONUCLEAR NO SQUAMOUS EPITHELIAL CELLS SEEN MODERATE GRAM POSITIVE COCCI IN PAIRS IN CLUSTERS Performed at [facility]    Culture   Final    ABUNDANT METHICILLIN RESISTANT STAPHYLOCOCCUS AUREUS Note: RIFAMPIN AND GENTAMICIN SHOULD NOT BE USED AS SINGLE DRUGS FOR TREATMENT OF STAPH INFECTIONS. This organism is presumed to be Clindamycin resistant based on detection of inducible Clindamycin resistance. CRITICAL RESULT CALLED TO, READ BACK BY AND  VERIFIED WITH: TRANACE D@ 0856 ON [id number] BY [facility] Performed at [facility]    Report Status 05/19/2015 FINAL  Final   Organism ID, Bacteria METHICILLIN RESISTANT STAPHYLOCOCCUS AUREUS  Final      Susceptibility   Methicillin resistant staphylococcus aureus - MIC*    CLINDAMYCIN <=0.25 SENSITIVE Sensitive     ERYTHROMYCIN >=8 RESISTANT Resistant     GENTAMICIN <=0.5 SENSITIVE Sensitive     LEVOFLOXACIN 4 INTERMEDIATE Intermediate     OXACILLIN >=4 RESISTANT Resistant     PENICILLIN >=0.5 RESISTANT Resistant     RIFAMPIN <=0.5 SENSITIVE Sensitive     TRIMETH/SULFA <=10 SENSITIVE Sensitive     VANCOMYCIN 1 SENSITIVE Sensitive     TETRACYCLINE 2 SENSITIVE Sensitive     * ABUNDANT METHICILLIN RESISTANT STAPHYLOCOCCUS AUREUS  Culture, blood (routine x 2)     Status: None   Collection Time: 05/18/15 12:30 PM  Result Value Ref Range Status   Specimen Description BLOOD LEFT HAND  Final   Special Requests BOTTLES DRAWN AEROBIC AND ANAEROBIC 5CC  Final   Culture NO GROWTH 5 DAYS  Final   Report  Status 05/23/2015 FINAL  Final  Culture, blood (routine x 2)     Status: None   Collection Time: 05/18/15 12:40 PM  Result Value Ref Range Status   Specimen Description BLOOD LEFT HAND  Final   Special Requests BOTTLES DRAWN AEROBIC AND ANAEROBIC 5CC  Final   Culture NO GROWTH 5 DAYS  Final   Report Status 05/23/2015 FINAL  Final  Blood culture (routine x 2)     Status: None   Collection Time: 06/02/15  4:20 PM  Result Value Ref Range Status   Specimen Description BLOOD RIGHT ANTECUBITAL  Final   Special Requests BOTTLES DRAWN AEROBIC AND ANAEROBIC 6CC  Final   Culture NO GROWTH 7 DAYS  Final   Report Status 06/09/2015 FINAL  Final  Blood culture (routine x 2)     Status: None   Collection Time: 06/02/15  4:27 PM  Result Value Ref Range Status   Specimen Description BLOOD LEFT HAND  Final   Special Requests BOTTLES DRAWN AEROBIC AND ANAEROBIC 6CC  Final   Culture NO GROWTH 7 DAYS  Final   Report Status 06/09/2015 FINAL  Final  Culture, blood (routine x 2)     Status: None (Preliminary result)   Collection Time: 06/11/15  3:45 AM  Result Value Ref Range Status   Specimen Description BLOOD LEFT ANTECUBITAL  Final   Special Requests   Final    BOTTLES DRAWN AEROBIC AND ANAEROBIC AEB 8CC ANA 10CC   Culture PENDING  Incomplete   Report Status PENDING  Incomplete  Culture, blood (routine x 2)     Status: None (Preliminary result)   Collection Time: 06/11/15  3:55 AM  Result Value Ref Range Status   Specimen Description BLOOD PICC  Final   Special Requests BOTTLES DRAWN AEROBIC ONLY 8CC  Final   Culture PENDING  Incomplete   Report Status PENDING  Incomplete   Medical History: Past Medical History  Diagnosis Date  . H/O back injury   . MRSA (methicillin resistant Staphylococcus aureus)   . 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 MRSA bacteremia 04/2015   Medications:  Scheduled:  . sodium chloride   Intravenous STAT  . docusate sodium  100 mg Oral BID  . enoxaparin (LOVENOX)  injection  40 mg Subcutaneous Q24H  . gabapentin  300 mg Oral TID  . levofloxacin (LEVAQUIN) IV  750 mg Intravenous Q24H  . piperacillin-tazobactam (ZOSYN)  IV  3.375 g Intravenous Q8H  . polyethylene glycol  17 g Oral BID  . senna  1 tablet Oral QHS  . sodium chloride  3 mL Intravenous Q12H  . sodium chloride  3 mL Intravenous Q12H  . vancomycin  1,250 mg Intravenous Q12H  . vancomycin  1,500 mg Intravenous Once   Assessment: 39 yo male admitted for workup due to fever and CXR showing atelectasis vs pneumonia. Recent admission on 6/23 for iliopsoas abscess.  Previous notes re: Vancomycin Rx / dosing reviewed.  SCr slightly elevated.   Goal of Therapy:  Vancomycin trough level 15-20 mcg/ml  Plan:  Vancomycin  IV now x 1 then Vancomycin  IV q12hrs  Check trough level at steady state Zosyn 3.375gm IV q8h, each dose over 4 hrs Levaquin  IV q24hrs Switch to PO when improved / appropriate Deescalate ABX when appropriate Monitor labs, progress, renal fxn, and c/s  Valrie Hart A, RPH 06/11/2015,8:20 AM

## 2015-06-12 ENCOUNTER — Ambulatory Visit (HOSPITAL_COMMUNITY)
Admission: RE | Admit: 2015-06-12 | Discharge: 2015-06-12 | Disposition: A | Discharge status: Home / Self Care | Payer: Medicaid Other | Source: Ambulatory Visit | Attending: Internal Medicine | Admitting: Internal Medicine

## 2015-06-12 ENCOUNTER — Inpatient Hospital Stay (HOSPITAL_COMMUNITY): Payer: Medicaid Other

## 2015-06-12 ENCOUNTER — Encounter (HOSPITAL_COMMUNITY): Payer: Self-pay

## 2015-06-12 DIAGNOSIS — L0291 Cutaneous abscess, unspecified: Secondary | ICD-10-CM | POA: Insufficient documentation

## 2015-06-12 DIAGNOSIS — M609 Myositis, unspecified: Secondary | ICD-10-CM | POA: Insufficient documentation

## 2015-06-12 DIAGNOSIS — M461 Sacroiliitis, not elsewhere classified: Secondary | ICD-10-CM | POA: Insufficient documentation

## 2015-06-12 DIAGNOSIS — K6812 Psoas muscle abscess: Secondary | ICD-10-CM

## 2015-06-12 DIAGNOSIS — R319 Hematuria, unspecified: Secondary | ICD-10-CM

## 2015-06-12 LAB — CBC
HCT: 23.3 % — ABNORMAL LOW (ref 39.0–52.0)
Hemoglobin: 7.6 g/dL — ABNORMAL LOW (ref 13.0–17.0)
MCH: 27.1 pg (ref 26.0–34.0)
MCHC: 32.6 g/dL (ref 30.0–36.0)
MCV: 83.2 fL (ref 78.0–100.0)
PLATELETS: 165 10*3/uL (ref 150–400)
RBC: 2.8 MIL/uL — ABNORMAL LOW (ref 4.22–5.81)
RDW: 14 % (ref 11.5–15.5)
WBC: 6.6 10*3/uL (ref 4.0–10.5)

## 2015-06-12 LAB — COMPREHENSIVE METABOLIC PANEL
ALK PHOS: 242 U/L — AB (ref 38–126)
ALT: 17 U/L (ref 17–63)
AST: 22 U/L (ref 15–41)
Albumin: 2.4 g/dL — ABNORMAL LOW (ref 3.5–5.0)
Anion gap: 6 (ref 5–15)
BILIRUBIN TOTAL: 0.3 mg/dL (ref 0.3–1.2)
BUN: 14 mg/dL (ref 6–20)
CALCIUM: 8.1 mg/dL — AB (ref 8.9–10.3)
CO2: 27 mmol/L (ref 22–32)
Chloride: 109 mmol/L (ref 101–111)
Creatinine, Ser: 1.66 mg/dL — ABNORMAL HIGH (ref 0.61–1.24)
GFR calc non Af Amer: 52 mL/min — ABNORMAL LOW (ref >60)
GFR, EST AFRICAN AMERICAN: 60 mL/min — AB (ref >60)
Glucose, Bld: 110 mg/dL — ABNORMAL HIGH (ref 65–99)
Potassium: 3.6 mmol/L (ref 3.5–5.1)
Sodium: 142 mmol/L (ref 135–145)
TOTAL PROTEIN: 6.2 g/dL — AB (ref 6.5–8.1)

## 2015-06-12 LAB — PROTIME-INR
INR: 1.19 (ref 0.00–1.49)
PROTHROMBIN TIME: 15.3 s — AB (ref 11.6–15.2)

## 2015-06-12 MED ORDER — SODIUM CHLORIDE 0.9 % IV SOLN
200.0000 mg | INTRAVENOUS | Status: AC
  Administered 2015-06-13: 200 mg via INTRAVENOUS
  Filled 2015-06-12: qty 200

## 2015-06-12 MED ORDER — HYDROMORPHONE HCL 1 MG/ML IJ SOLN
1.0000 mg | INTRAMUSCULAR | Status: AC
  Administered 2015-06-12: 1 mg via INTRAVENOUS
  Filled 2015-06-12: qty 1

## 2015-06-12 MED ORDER — LIDOCAINE HCL 1 % IJ SOLN
INTRAMUSCULAR | Status: AC
  Filled 2015-06-12: qty 20

## 2015-06-12 MED ORDER — FLUCONAZOLE IN SODIUM CHLORIDE 400-0.9 MG/200ML-% IV SOLN
800.0000 mg | Freq: Once | INTRAVENOUS | Status: DC
  Filled 2015-06-12: qty 400

## 2015-06-12 MED ORDER — SODIUM CHLORIDE 0.9 % IV SOLN
INTRAVENOUS | Status: AC
  Administered 2015-06-13: 02:00:00 via INTRAVENOUS

## 2015-06-12 MED ORDER — SODIUM CHLORIDE 0.9 % IV SOLN
100.0000 mg | INTRAVENOUS | Status: DC
  Administered 2015-06-13 – 2015-06-18 (×6): 100 mg via INTRAVENOUS
  Filled 2015-06-12 (×7): qty 100

## 2015-06-12 MED ORDER — HYDROMORPHONE HCL 1 MG/ML IJ SOLN
1.0000 mg | INTRAMUSCULAR | Status: DC | PRN
  Administered 2015-06-12 – 2015-06-18 (×55): 1 mg via INTRAVENOUS
  Filled 2015-06-12 (×57): qty 1

## 2015-06-12 MED ORDER — SODIUM CHLORIDE 0.9 % IV SOLN
200.0000 mg | INTRAVENOUS | Status: DC
  Filled 2015-06-12: qty 200

## 2015-06-12 MED ORDER — FENTANYL CITRATE (PF) 100 MCG/2ML IJ SOLN
INTRAMUSCULAR | Status: DC
Start: 2015-06-12 — End: 2015-06-13
  Filled 2015-06-12: qty 4

## 2015-06-12 MED ORDER — MIDAZOLAM HCL 2 MG/2ML IJ SOLN
INTRAMUSCULAR | Status: AC
  Filled 2015-06-12: qty 4

## 2015-06-12 MED ORDER — MIDAZOLAM HCL 2 MG/2ML IJ SOLN
INTRAMUSCULAR | Status: DC | PRN
  Administered 2015-06-12 (×2): 0.5 mg via INTRAVENOUS
  Administered 2015-06-12: 1 mg via INTRAVENOUS

## 2015-06-12 MED ORDER — FLUCONAZOLE IN SODIUM CHLORIDE 400-0.9 MG/200ML-% IV SOLN: 400.0000 mg | INTRAVENOUS | Status: DC

## 2015-06-12 MED ORDER — FENTANYL CITRATE (PF) 100 MCG/2ML IJ SOLN
INTRAMUSCULAR | Status: DC | PRN
  Administered 2015-06-12 (×2): 25 ug via INTRAVENOUS
  Administered 2015-06-12: 50 ug via INTRAVENOUS

## 2015-06-12 NOTE — Clinical Documentation Improvement (Signed)
Possible Clinical Conditions?  Septicemia / Sepsis Severe Sepsis Septic Shock Other Condition  Cannot clinically Determine   Supporting Information: (As per notes) "? Hcap", "Psoas abscess, MRSA bacteremia, Cont vanco" Diagnostics: Vitals in the ED upon admission: BP 147/92 mmHg  Pulse 114  Temp(Src) 100.7 F (38.2 C) (Oral)  Resp 20  Labs:06/11/15 WBC:11.4 (H)   Thank You, Nevin Bloodgood, RN, BSN, CCDS,Clinical Documentation Specialist:  [phone]  [phone]=[facility] [place]- Health Information Management

## 2015-06-12 NOTE — Sedation Documentation (Signed)
Patient is resting comfortably. 

## 2015-06-12 NOTE — Progress Notes (Signed)
[facility] PROGRESS NOTE  [name] ZOX:[id number] DOB: 17-Jun-1976 DOA: 06/11/2015 PCP: Doris Cheadle, MD  Assessment/Plan:  Multiple paraspinal abscesses/septic arthritis SI joint:  per MRI. Exam consistent with imaging. Hx of same with drains. Evaluated by IR who offered aspiration vs drain. Discussed with ID who recommended aspiration and as well as TEE if current blood cultures return positive. ID concerned other source infection/fever. PICC?  Continue Vanc and Zosyn day #2. Currently he is afebrile and non-toxic. Hemodynamically stable. Monitor closely  Fever: likely related to above. Max temp 102.8 last evening. Blood cultures with yeast x2. Will remove PICC (not being used). Spoke with ID who recommends Eraxis.  Urine with negative leukocytes and few bacteria 0-2 WBC. Leukocytosis resolved. Physical exam not consistent with pneumonia. Hx MRSA bacteremia as well as #1.  Antibiotics as above. Remains hemodynamically stable not toxic appearing. See #1.   Acute kidney injury: likely related to decreased oral intake in setting of above in setting of Vancomycin.Creatinine trending down slightly. chart review indicates creatinine with in limits of normal 2 weeks ago. Await renal US results. Continue fluids  Hematuria: per urinalysis. Chart review indicates present since 04/2015. Etiology uncertain. Urine output good. Hg 8.6.  Await results renal US. Consider urology consult  Anemia: likely acute blood loss in setting of subacute illness. B12 143, iron 8. Hg trending down. Some dilutional effect. Continue to monitor and transfuse if < 7.0.   Tachycardia: related to #1 and #2 and #6. Troponin negative. Resolved.  Hypokalemia: repleted and resolved. Will monitor.  Code Status: full Family Communication: patient  Disposition Plan: home when ready   Consultants:  ID per phon  IR  ortho  Procedures:  Aspiration of abscess  Antibiotics:  Vancomycin 06/10/15>>  levaquin  7/19-7/20  Zosyn 06/11/15>>  HPI/Subjective: Sitting in chair. Reports continued pain.   Objective: Filed Vitals:   06/12/15 0921  BP: 148/98  Pulse: 72  Temp: 98.3 F (36.8 C)  Resp: 18    Intake/Output Summary (Last 24 hours) at 06/12/15 1016 Last data filed at 06/12/15 0912  Gross per 24 hour  Intake    840 ml  Output   1525 ml  Net   -685 ml   Filed Weights   06/11/15 0234  Weight: 72.576 kg (160 lb)    Exam:   General:  Appears uncomfortable   Cardiovascular: rrr no MGR no LE edema  Respiratory: normal effort BS clear bilaterally no wheeze  Abdomen: soft +BS non-tender to palpation  Musculoskeletal: low back tender to palpation. Otherwise joints without swelling/erythema   Data Reviewed: Basic Metabolic Panel:  Recent Labs Lab 06/05/15 1840 06/09/15 1848 06/11/15 0345 06/11/15 1501 06/12/15 0708  NA  --  139 139 143 142  K  --  3.2* 2.7* 3.9 3.6  CL  --  101 104 110 109  CO2  --  GLUCOSE  --  104* 143* 96 110*  BUN CREATININE 1.14 1.20 1.51* 1.70* 1.66*  CALCIUM  --  8.3* 8.2* 8.1* 8.1*   Liver Function Tests:  Recent Labs Lab 06/09/15 1848 06/11/15 0345 06/12/15 0708  AST 12* 12* 22  ALT 13* 11* 17  ALKPHOS 164* 163* 242*  BILITOT 0.5 0.6 0.3  PROT 6.5 7.0 6.2*  ALBUMIN 2.5* 2.6* 2.4*    Recent Labs Lab 06/09/15 1848  LIPASE 16*   No results for input(s): AMMONIA in the last 168 hours. CBC:  Recent  Labs Lab 06/09/15 1848 06/11/15 0345 06/12/15 0708  WBC 8.4 11.4* 6.6  NEUTROABS  --  9.6*  --   HGB 8.1* 8.4* 7.6*  HCT 25.6* 25.4* 23.3*  MCV 81.3 81.4 83.2  PLT 248 243 165   Cardiac Enzymes:  Recent Labs Lab 06/11/15 0634  TROPONINI <0.03   BNP (last 3 results) No results for input(s): BNP in the last 8760 hours.  ProBNP (last 3 results) No results for input(s): PROBNP in the last 8760 hours.  CBG: No results for input(s): GLUCAP in the last 168 hours.  Recent Results (from  the past 240 hour(s))  Blood culture (routine x 2)     Status: None   Collection Time: 06/02/15  4:20 PM  Result Value Ref Range Status   Specimen Description BLOOD RIGHT ANTECUBITAL  Final   Special Requests BOTTLES DRAWN AEROBIC AND ANAEROBIC 6CC  Final   Culture NO GROWTH 7 DAYS  Final   Report Status 06/09/2015 FINAL  Final  Blood culture (routine x 2)     Status: None   Collection Time: 06/02/15  4:27 PM  Result Value Ref Range Status   Specimen Description BLOOD LEFT HAND  Final   Special Requests BOTTLES DRAWN AEROBIC AND ANAEROBIC 6CC  Final   Culture NO GROWTH 7 DAYS  Final   Report Status 06/09/2015 FINAL  Final  Urine culture     Status: None   Collection Time: 06/09/15  7:47 PM  Result Value Ref Range Status   Specimen Description URINE, CLEAN CATCH  Final   Special Requests NONE  Final   Culture NO GROWTH 1 DAY  Final   Report Status 06/11/2015 FINAL  Final  Culture, blood (routine x 2)     Status: None (Preliminary result)   Collection Time: 06/11/15  3:45 AM  Result Value Ref Range Status   Specimen Description BLOOD LEFT ANTECUBITAL DRAWN BY RN  Final   Special Requests   Final    BOTTLES DRAWN AEROBIC AND ANAEROBIC AEB=8CC ANA=10CC   Culture NO GROWTH < 12 HOURS  Final   Report Status PENDING  Incomplete  Culture, blood (routine x 2)     Status: None (Preliminary result)   Collection Time: 06/11/15  3:55 AM  Result Value Ref Range Status   Specimen Description BLOOD PICC LINE DRAWN BY RN TT  Final   Special Requests BOTTLES DRAWN AEROBIC ONLY 8CC  Final   Culture NO GROWTH < 12 HOURS  Final   Report Status PENDING  Incomplete     Studies: Mr Lumbar Spine W Wo Contrast  06/11/2015   CLINICAL DATA:  Fever. Continued low back pain. History of abscess drainage.  EXAM: MRI LUMBAR SPINE WITHOUT AND WITH CONTRAST  TECHNIQUE: Multiplanar and multiecho pulse sequences of the lumbar spine were obtained without and with intravenous contrast.  CONTRAST:  15mL MULTIHANCE  GADOBENATE DIMEGLUMINE 529 MG/ML IV SOLN  COMPARISON:  06/02/2015 lumbar spine MRI. 06/09/2015 CT abdomen and pelvis.  FINDINGS: Vertebral alignment is unchanged with slight retrolisthesis of L5 on S1. Vertebral body heights are preserved. Disc desiccation and mild disc space narrowing are present at L4-5 and L5-S1, similar to prior. No vertebral marrow edema is seen in the lumbar spine. Conus medullaris is normal in signal and terminates at L1.  T11-12: Only imaged sagittally. Mild disc bulging results in at most mild spinal canal and neural foraminal narrowing, unchanged.  T12-L1:  Only imaged sagittally.  No disc herniation or stenosis.  L1-2  through L3-4:  Negative.  L4-5: Mild circumferential disc bulging, small superimposed central disc protrusion, and mild facet hypertrophy result in minimal spinal canal narrowing and minimal bilateral neural foraminal narrowing, unchanged. Minimal residual dorsal epidural enhancement at the site of prior phlegmon has decreased from prior.  L5-S1: Mild circumferential disc bulging, broad central disc protrusion, and mild facet hypertrophy result in mild right greater than left lateral recess stenosis and mild bilateral neural foraminal stenosis, unchanged. No spinal stenosis.  Marrow edema about the left SI joint involving the left sacral ala and iliac bone does not appear significantly different than on the prior MRI. Erosive changes and fluid are again noted in the left SI joint. Edema is again seen in the soft tissues surrounding the left SI joint including the iliopsoas and gluteal musculature, lower lumbar paraspinal musculature, and other surrounding soft tissues.  Fluid collection at the superior aspect of the left iliacus muscle has enlarged from the prior MRI, now measuring 2.1 x 1.9 cm (previously 1.4 x 0.8 cm). Fluid tracks inferiorly from this collection along the anterior aspect of the left SI joint, and there is a new 1.2 cm fluid collection just superior to the  left pelvic sidewall posterior to the left external iliac vein (series 7, image 49). Extensive surrounding phlegmon is again seen about the left iliopsoas musculature. Trace free fluid is noted in the right mid abdomen near the tip of the liver, grossly stable to minimally increased compared to that on prior CT.  IMPRESSION: 1. Persistent extensive marrow and soft tissue edema about the left SI joint consistent with septic arthritis and surrounding myositis and soft tissue phlegmon. 2. 2.1 cm fluid collection/abscess at the superior aspect of the left iliacus, larger than on prior MRI. 3. New 1.2 cm collection just superior to the left pelvic sidewall. 4. No evidence of new infection in the lumbar spine. Minimal residual dorsal epidural enhancement at L4-5.   Electronically Signed   By: Sebastian Ache   On: 06/11/2015 14:01   Dg Chest Port 1 View  06/11/2015   CLINICAL DATA:  Acute onset of nausea, upper back pain and headache. Initial encounter.  EXAM: PORTABLE CHEST - 1 VIEW  COMPARISON:  Chest radiograph performed 05/15/2015  FINDINGS: The lungs are well-aerated. Mild bibasilar opacities may reflect atelectasis or possibly mild pneumonia. There is no evidence of pleural effusion or pneumothorax.  The cardiomediastinal silhouette is within normal limits. No acute osseous abnormalities are seen. A left PICC is noted ending about the cavoatrial junction.  IMPRESSION: Mild bibasilar airspace opacities may reflect atelectasis or possibly mild pneumonia.   Electronically Signed   By: Roanna Raider M.D.   On: 06/11/2015 04:50    Scheduled Meds: . docusate sodium  100 mg Oral BID  . gabapentin  300 mg Oral TID  . piperacillin-tazobactam (ZOSYN)  IV  3.375 g Intravenous Q8H  . polyethylene glycol  17 g Oral BID  . senna  1 tablet Oral QHS  . sodium chloride  3 mL Intravenous Q12H  . sodium chloride  3 mL Intravenous Q12H  . vancomycin  1,250 mg Intravenous Q12H   Continuous Infusions: . sodium chloride  1,000 mL (06/11/15 1715)    Active Problems:   Iliopsoas abscess   MRSA bacteremia   Acute kidney injury (nontraumatic)   Fever   Anemia   Acute renal failure   Hypokalemia   H/O back injury   Intractable pain   Hematuria    Time spent: 30  minutes    [facility]  [facility] Pager [phone]. If 7PM-7AM, please contact night-coverage at [email or web address], password [facility] 06/12/2015, 10:16 AM  LOS: 1 day

## 2015-06-12 NOTE — Procedures (Signed)
CT aspiration L psoas abscess 5ml cloudy yellow fluid, sent for GS, C&S No complication No blood loss. See complete dictation in [facility]

## 2015-06-12 NOTE — Consult Note (Signed)
Chief Complaint: Patient was seen in consultation today for back pain/ psoas abscess at the request of Gwenyth Bender  Referring Physician(s): Gwenyth Bender  History of Present Illness: [name] is a 39 y.o. male   Hx psoas abscess drains placed 05/16/15 in IR Pt went home with drains in place and removed 7/18 Returns to ED and admitted to [facility] 7/20 with back pain Hematuria and weakness MRI 7/20:  IMPRESSION: 1. Persistent extensive marrow and soft tissue edema about the left SI joint consistent with septic arthritis and surrounding myositis and soft tissue phlegmon. 2. 2.1 cm fluid collection/abscess at the superior aspect of the left iliacus, larger than on prior MRI. 3. New 1.2 cm collection just superior to the left pelvic sidewall. 4. No evidence of new infection in the lumbar spine. Minimal residual dorsal epidural enhancement at L4-5.  Request for new drain vs aspiration of collections Dr Deanne Coffer has reviewed imaging and approves aspiration of collections Now scheduled for same  Past Medical History  Diagnosis Date  . H/O back injury   . MRSA (methicillin resistant Staphylococcus aureus)   . 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 MRSA bacteremia 04/2015    Past Surgical History  Procedure Laterality Date  . Skin biopsy    . Abdmoninal drain insertion      Allergies: Vicodin and Adhesive  Medications: Prior to Admission medications   Medication Sig Start Date End Date Taking? Authorizing Provider  acetaminophen-codeine (TYLENOL #3) 300-30 MG per tablet Take 1 tablet by mouth every 12 (twelve) hours as needed for moderate pain. Patient not taking: Reported on 06/02/2015 05/28/15   Doris Cheadle, MD  amoxicillin-clavulanate (AUGMENTIN) 875-125 MG per tablet Take 1 tablet by mouth 2 (two) times daily. One po bid x 7 days Patient not taking: Reported on 06/11/2015 06/09/15   Arby Barrette, MD  diphenhydramine-acetaminophen (TYLENOL PM) 25-500 MG TABS Take 1  tablet by mouth at bedtime as needed (SLEEP).     Historical Provider, MD  docusate sodium (COLACE) 100 MG capsule Take 1 capsule (100 mg total) by mouth 2 (two) times daily. Patient not taking: Reported on 05/28/2015 05/23/15   Osvaldo Shipper, MD  doxycycline (VIBRAMYCIN) 100 MG capsule Take 1 capsule (100 mg total) by mouth 2 (two) times daily. One po bid x 7 days Patient not taking: Reported on 06/11/2015 06/09/15   Arby Barrette, MD  gabapentin (NEURONTIN) 300 MG capsule Take 1 capsule (300 mg total) by mouth 3 (three) times daily. Patient not taking: Reported on 06/09/2015 05/28/15   Doris Cheadle, MD  methocarbamol (ROBAXIN) 500 MG tablet Take 1 tablet (500 mg total) by mouth every 8 (eight) hours as needed for muscle spasms. Patient not taking: Reported on 06/11/2015 05/23/15   Osvaldo Shipper, MD  metroNIDAZOLE (FLAGYL) 500 MG tablet Take 1 tablet (500 mg total) by mouth 2 (two) times daily. One po bid x 7 days Patient not taking: Reported on 06/11/2015 06/09/15   Arby Barrette, MD  Oxycodone HCl 10 MG TABS Take 1 tablet (10 mg total) by mouth every 4 (four) hours as needed for severe pain. Patient not taking: Reported on 06/11/2015 06/02/15   Glynn Octave, MD  polyethylene glycol [facility] / Ethelene Hal) packet Take 17 g by mouth 2 (two) times daily. May change to as needed once with regular bowel movements. Patient not taking: Reported on 05/28/2015 05/23/15   Osvaldo Shipper, MD  senna (SENOKOT) 8.6 MG TABS tablet Take 1 tablet (8.6 mg total)  by mouth at bedtime. Patient not taking: Reported on 05/28/2015 05/23/15   Osvaldo Shipper, MD  traMADol (ULTRAM) 50 MG tablet Take 2 tablets (100 mg total) by mouth every 6 (six) hours. Patient not taking: Reported on 06/02/2015 05/23/15   Osvaldo Shipper, MD  vancomycin 1,500 mg in sodium chloride 0.9 % 500 mL Inject 1,500 mg into the vein every 12 (twelve) hours. For 3 more weeks 05/23/15   Osvaldo Shipper, MD     Family History  Problem Relation Age of Onset  . Cancer  Father     History   Social History  . Marital Status: Married    Spouse Name: N/A  . Number of Children: N/A  . Years of Education: N/A   Social History Main Topics  . Smoking status: Current Every Day Smoker -- 0.50 packs/day for 20 years    Types: Cigarettes  . Smokeless tobacco: Never Used  . Alcohol Use: No  . Drug Use: No     Comment: quit marijuana- 3-4 months ago  . Sexual Activity: Yes    Birth Control/ Protection: None   Other Topics Concern  . None   Social History Narrative     Review of Systems: A 12 point ROS discussed and pertinent positives are indicated in the HPI above.  All other systems are negative.  Review of Systems  Constitutional: Positive for fever, activity change, appetite change and unexpected weight change.  Respiratory: Negative for shortness of breath.   Cardiovascular: Negative for chest pain.  Gastrointestinal: Positive for abdominal pain.  Genitourinary: Positive for hematuria.  Musculoskeletal: Positive for back pain.  Neurological: Positive for weakness.  Psychiatric/Behavioral: Negative for behavioral problems and confusion.    Vital Signs: BP 150/104 mmHg  Pulse 70  Resp 21  SpO2 100%  Physical Exam  Constitutional: He is oriented to person, place, and time.  Cardiovascular: Normal rate, regular rhythm and normal heart sounds.   Pulmonary/Chest: Effort normal and breath sounds normal. He has no wheezes.  Abdominal: Soft. Bowel sounds are normal.  Musculoskeletal: Normal range of motion.  Back pain  Neurological: He is alert and oriented to person, place, and time.  Skin: Skin is warm and dry.  Psychiatric: He has a normal mood and affect. His behavior is normal. Judgment and thought content normal.  Nursing note and vitals reviewed.   Mallampati Score:  MD Evaluation Airway: WNL Heart: WNL Abdomen: WNL Chest/ Lungs: WNL ASA  Classification: 2 Mallampati/Airway Score: One  Imaging: Dg Chest 2  View  05/15/2015   CLINICAL DATA:  Smoker. Difficulty eating with abdominal pain. Swelling in rash involving both feet. Back pain.  EXAM: CHEST  2 VIEW  COMPARISON:  None.  FINDINGS: Cardiomediastinal silhouette is within normal limits. The lungs are mildly hypoinflated without evidence of airspace consolidation, edema, pleural effusion, or pneumothorax. No acute osseous abnormality is identified.  IMPRESSION: No active cardiopulmonary disease.   Electronically Signed   By: Sebastian Ache   On: 05/15/2015 16:51   Mr Lumbar Spine Wo Contrast  05/15/2015   CLINICAL DATA:  39 year old male with severe lumbar back pain involving the left lower extremity x1 month. Weakness.  EXAM: MRI LUMBAR SPINE WITHOUT CONTRAST  TECHNIQUE: Multiplanar, multisequence MR imaging of the lumbar spine was performed. No intravenous contrast was administered.  COMPARISON:  Lumbar spine radiographs 11/22/2012.  FINDINGS: Study is intermittently degraded by motion artifact despite repeated imaging attempts.  Normal lumbar segmentation demonstrated in 2014. Stable vertebral height and alignment.  There  is disc desiccation at T11-T12 and disc bulge which results in borderline to mild spinal stenosis. No mass effect on the lower thoracic spinal cord at this level. No signal abnormality in the lower thoracic spinal cord or conus, the tip of the conus is at L1-L2. The T12-L1 through L3-L4 discs then are normal.  There is severe edema in the medial and posterior left psoas muscle beginning at the L2-L3 level but increasing along the caudal aspect of the muscle. There is also severe edema in the visible left iliacus muscle and in the left posterior paraspinal muscles (erector spine I) beginning at the L4-L5 level and continuing inferiorly. There is some evidence of left sacral ala marrow edema (series 5, image 15).  There is a superimposed posterior epidural mass extending from the mid L4 to the mid S1 level. This measures up to 10 mm AP thickness  and contributes to severe spinal stenosis at the L4-L5 and L5-S1 levels due to mass effect on the posterior thecal sac (series 7, image 31). There is chronic disc degeneration at each 1 of these levels with disc bulging and central disc herniation.  Furthermore there is abnormal presacral STIR signal (series 5, image 19).  The right side paraspinal muscles and soft tissues appear normal.  IMPRESSION: 1. Marked abnormality in the left psoas, iliacus, and lower paraspinal muscles. The epicenter seems to be the left sacral ala which appears edematous. Leading consideration at this point is septic left SI joint. In light of the patient's difficulty with MRI, CT Abdomen and Pelvis with oral and IV contrast may be most valuable at this point. 2. Widespread posterior epidural space-occupying mass might be infectious phlegmon. This contributes to severe spinal stenosis at L4-L5 and L5-S1 due to posterior thecal sac mass effect. There is underlying L4-L5 and L5-S1 disc degeneration. 3. No definite marrow edema or acute osseous abnormality in the lumbar spine. Study discussed by telephone with Dr. Vanetta Mulders on 05/15/2015 at 1730 hr.   Electronically Signed   By: Odessa Fleming M.D.   On: 05/15/2015 18:12   Mr Lumbar Spine W Wo Contrast  06/11/2015   CLINICAL DATA:  Fever. Continued low back pain. History of abscess drainage.  EXAM: MRI LUMBAR SPINE WITHOUT AND WITH CONTRAST  TECHNIQUE: Multiplanar and multiecho pulse sequences of the lumbar spine were obtained without and with intravenous contrast.  CONTRAST:  15mL MULTIHANCE GADOBENATE DIMEGLUMINE 529 MG/ML IV SOLN  COMPARISON:  06/02/2015 lumbar spine MRI. 06/09/2015 CT abdomen and pelvis.  FINDINGS: Vertebral alignment is unchanged with slight retrolisthesis of L5 on S1. Vertebral body heights are preserved. Disc desiccation and mild disc space narrowing are present at L4-5 and L5-S1, similar to prior. No vertebral marrow edema is seen in the lumbar spine. Conus  medullaris is normal in signal and terminates at L1.  T11-12: Only imaged sagittally. Mild disc bulging results in at most mild spinal canal and neural foraminal narrowing, unchanged.  T12-L1:  Only imaged sagittally.  No disc herniation or stenosis.  L1-2 through L3-4:  Negative.  L4-5: Mild circumferential disc bulging, small superimposed central disc protrusion, and mild facet hypertrophy result in minimal spinal canal narrowing and minimal bilateral neural foraminal narrowing, unchanged. Minimal residual dorsal epidural enhancement at the site of prior phlegmon has decreased from prior.  L5-S1: Mild circumferential disc bulging, broad central disc protrusion, and mild facet hypertrophy result in mild right greater than left lateral recess stenosis and mild bilateral neural foraminal stenosis, unchanged. No spinal stenosis.  Marrow edema about the left SI joint involving the left sacral ala and iliac bone does not appear significantly different than on the prior MRI. Erosive changes and fluid are again noted in the left SI joint. Edema is again seen in the soft tissues surrounding the left SI joint including the iliopsoas and gluteal musculature, lower lumbar paraspinal musculature, and other surrounding soft tissues.  Fluid collection at the superior aspect of the left iliacus muscle has enlarged from the prior MRI, now measuring 2.1 x 1.9 cm (previously 1.4 x 0.8 cm). Fluid tracks inferiorly from this collection along the anterior aspect of the left SI joint, and there is a new 1.2 cm fluid collection just superior to the left pelvic sidewall posterior to the left external iliac vein (series 7, image 49). Extensive surrounding phlegmon is again seen about the left iliopsoas musculature. Trace free fluid is noted in the right mid abdomen near the tip of the liver, grossly stable to minimally increased compared to that on prior CT.  IMPRESSION: 1. Persistent extensive marrow and soft tissue edema about the left  SI joint consistent with septic arthritis and surrounding myositis and soft tissue phlegmon. 2. 2.1 cm fluid collection/abscess at the superior aspect of the left iliacus, larger than on prior MRI. 3. New 1.2 cm collection just superior to the left pelvic sidewall. 4. No evidence of new infection in the lumbar spine. Minimal residual dorsal epidural enhancement at L4-5.   Electronically Signed   By: Sebastian Ache   On: 06/11/2015 14:01   Mr Lumbar Spine W Wo Contrast  06/02/2015   CLINICAL DATA:  39 year old male with severe lumbar back pain radiating to the left hip and lower extremity. Recent left iliopsoas abscess and pelvic abscess drainage. Subsequent encounter.  EXAM: MRI LUMBAR SPINE WITHOUT AND WITH CONTRAST  TECHNIQUE: Multiplanar and multiecho pulse sequences of the lumbar spine were obtained without and with intravenous contrast.  CONTRAST:  15mL MULTIHANCE GADOBENATE DIMEGLUMINE 529 MG/ML IV SOLN  COMPARISON:  CT Abdomen and Pelvis 05/22/2015. Lumbar MRI 05/15/2015.  FINDINGS: Same numbering system as on 05/15/2015 designating normal lumbar segmentation. Stable lumbar and visualized lower thoracic vertebral height and alignment. No lumbar or lower thoracic marrow edema identified.  T12-L1 disc space loss with circumferential disc bulge appears stable. Mild facet hypertrophy also at that level. Up to mild multifactorial spinal and T11 foraminal stenosis appears stable. No signal abnormality identified in the adjacent lower thoracic spinal cord. The conus medullaris appears normal at L1.  The L1-L2 through L3-L4 disc spaces remain within normal limits.  Resolved posterior L4-L5 and L5-S1 epidural phlegmon/ inflammation. Improved thecal sac patency at both levels. Underlying L4-L5 and L5-S1 disc and facet degeneration appears stable.  Abnormal T2 and STIR signal surrounding the left SI joint and involving the left sacral ala, medial left iliac bone, an the surrounding anterior and posterior soft tissues  including the visible iliopsoas muscle and left gluteal muscles, persists but appears mildly regressed since June. Associated abnormal postcontrast enhancement of these bony and soft tissues. Trace presacral fluid today. Percutaneous appears stable in position. Left iliopsoas drainage catheter re- identified. Minimal fluid surrounding the catheter. 2-3 cm cephalad of the catheter there is a small 14 mm iliopsoas fluid collection (series 5, image 33).  Visualized abdominal soft tissues are within normal limits.  IMPRESSION: 1. Resolved dorsal lower lumbar epidural phlegmon since 05/15/2015. No acute findings in the lumbar spine. 2. 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 3. Percutaneous left iliopsoas drainage catheter in place with minimal fluid adjacent to the catheter, and small 14 mm fluid collection a short distance cephalad to the catheter.   Electronically Signed   By: Odessa Fleming M.D.   On: 06/02/2015 18:51   Ct Abdomen Pelvis W Contrast  06/09/2015   CLINICAL DATA:  39 year old male with history of left psoas abscess and drainage presenting with abdominal pain  EXAM: CT ABDOMEN AND PELVIS WITH CONTRAST  TECHNIQUE: Multidetector CT imaging of the abdomen and pelvis was performed using the standard protocol following bolus administration of intravenous contrast.  CONTRAST:  OMNIPAQUE IOHEXOL 300 MG/ML  SOLN  COMPARISON:  CT dated 05/22/2015 and 06/02/2015  FINDINGS: The visualized lung bases are clear. No intra-abdominal free air or free fluid.  Trace pericholecystic fluid may be present. There is mild periportal edema. The liver is otherwise unremarkable. No gallstone identified. The pancreas appears unremarkable. Splenomegaly measuring up to 16 cm. The adrenal glands, kidneys, visualized ureters and urinary bladder appear unremarkable. The prostate and seminal vesicles are  grossly unremarkable.  Moderate stool throughout the colon. No evidence of bowel obstruction or inflammation. Normal appendix.  The visualized abdominal aorta and IVC are patent. No portal venous gas identified. There is no tendinopathy. Is there is diffuse haziness of the retroperitoneal and periaortic fat planes.  There is inflammatory changes of the left psoas and iliacus muscles. there has been interval removal of the left pelvic wall pigtail drainage catheter. A 1.3 x 1.0 cm hypodense focus noted in the left iliacus muscle (series 201, image 62) may represent residual fluid. There is overall interval improvement of the inflammatory changes of the left pelvic sidewall. No drainable collection/abscess identified.  There is irregularity of the left sacroiliac joint compatible with sacroiliitis which is new compared to the study dated 05/22/2015 and progressed since the study 06/02/2015. There is degenerative changes of the spine. No acute fracture. Mild diffuse subcutaneous soft tissue stranding noted.  IMPRESSION: Interval abdominal of the left pelvic side hole drainage catheter with overall improvement of the inflammatory changes of the left psoas and iliacus muscles. Small stable hypodense focus in the superior aspect of the left iliacus muscle likely represents residual fluid. No drainable fluid collection/ abscess identified.  Irregularity of the left SI joint, with interval progression compared the prior study concerning for sacroiliitis. Clinical correlation is recommended.   Electronically Signed   By: Elgie Collard M.D.   On: 06/09/2015 22:33   Ct Abdomen Pelvis W Contrast  06/02/2015   CLINICAL DATA:  39 year old male with history of iliopsoas abscess, s/p JP drain placement. Pain at site of insertion since 05/30/2015. History of MRSA.  EXAM: CT ABDOMEN AND PELVIS WITH CONTRAST  TECHNIQUE: Multidetector CT imaging of the abdomen and pelvis was performed using the standard protocol following bolus  administration of intravenous contrast.  CONTRAST:  50mL OMNIPAQUE IOHEXOL 300 MG/ML SOLN, OMNIPAQUE IOHEXOL 300 MG/ML SOLN  COMPARISON:  CT of the abdomen and pelvis 05/22/2015.  FINDINGS: Lower chest:  Unremarkable.  Hepatobiliary: No cystic or solid hepatic lesions. No intra or extrahepatic biliary ductal dilatation. Gallbladder is normal in appearance.  Pancreas: No pancreatic mass. No pancreatic ductal dilatation. No pancreatic or peripancreatic fluid or inflammatory changes.  Spleen: Multiple small calcified granulomas in the spleen.  Adrenals/Urinary Tract: Bilateral adrenal glands and bilateral kidneys are normal in appearance. No hydroureteronephrosis or perinephric stranding to indicate urinary  tract obstruction at this time. Urinary bladder is normal in appearance.  Stomach/Bowel: Stomach is normal in appearance. No pathologic dilatation of small bowel or colon. Normal appendix.  Vascular/Lymphatic: Atherosclerosis throughout the abdominal and pelvic vasculature, without evidence of aneurysm or dissection. Numerous prominent but nonenlarged retroperitoneal lymph nodes are noted, which are nonspecific, likely reactive.  Reproductive: Prostate gland and seminal vesicles are unremarkable in appearance.  Other: Again noted are 2 pigtail drainage catheters in place in the left iliopsoas musculature, and along the left pelvic sidewall. Previously noted fluid collections in these regions appear to have completely resolved. There is a tiny focus of low attenuation in the superior aspect of the left iliacus muscle (image 45 of series 7), which measures approximately 8 mm in greatest length, slightly smaller than the prior examination, which could represent an additional small abscess, or may represent an intramuscular seroma or resolving chronic hematoma. No significant volume of ascites. No pneumoperitoneum.  Musculoskeletal: There are no aggressive appearing lytic or blastic lesions noted in the visualized  portions of the skeleton.  IMPRESSION: 1. Left-sided pigtail drainage catheters remain in position in the left iliopsoas musculature and along the left pelvic sidewall, with essentially complete resolution of previously noted abscesses. Separate from the drained collections there is an 8 mm focus in the cephalad aspect of the left iliacus musculature immediately lateral to the superior aspect of the left sacroiliac joint, which may represent an additional small abscess (if so, this is smaller than the prior study), or a resolving intramuscular seroma or chronic hematoma. 2. Additional incidental findings, as above.   Electronically Signed   By: Trudie Reed M.D.   On: 06/02/2015 19:12   Ct Abdomen Pelvis W Contrast  05/22/2015   CLINICAL DATA:  39 year old male with left iliopsoas abscess.  EXAM: CT ABDOMEN AND PELVIS WITH CONTRAST  TECHNIQUE: Multidetector CT imaging of the abdomen and pelvis was performed using the standard protocol following bolus administration of intravenous contrast.  CONTRAST:  OMNIPAQUE IOHEXOL 300 MG/ML  SOLN  COMPARISON:  CT dated 05/16/2015  FINDINGS: Evaluation of this exam is limited due to streak artifact caused by patient's arms.  Lower chest:  Unremarkable.  Peritoneum: No free air or free fluid.  Liver: Unremarkable.No intrahepatic biliary ductal dilatation.  Gallbladder: Unremarkable. No calcified gallstone. No pericholecystic fluid.  Pancreas: Unremarkable.No ductal dilatation.  Spleen: Unremarkable.  Adrenals glands: Unremarkable.  Kidneys, ureters, urinary bladder: No hydronephrosis.The urinary bladder is grossly unremarkable.  Reproductive: Unremarkable.  Bowel and appendix: Constipation. No bowel obstruction. Multiple normal caliber fecalized loops of small bowel noted suggestive of chronic stasis.The appendix is unremarkable.  Vascular/Lymphatic: Unremarkable.No lymphadenopathy.  Abdominal wall/Musculoskeletal: There has been significant interval decrease in the  size of the left pelvic sidewall collections. There are two percutaneous pigtail drainage catheters, one positioned between the left psoas and iliacus muscle and the upper in the lower pelvic sidewall. There is focal heterogeneous attenuating tissue along the left psoas muscle at the level of the left SI joint in the region of the previous abscess likely representing residual fluid or phlegmon. No drainable fluid collection identified adjacent to the tips of the drainage catheters.  IMPRESSION: Decrease in the size of the previously seen pelvic side wall fluid collections/abscesses. Small residual phlegmon/abscess is present in the location the superior collection. No drainable fluid identified in the region of the previously seen inferior location within the pelvis.   Electronically Signed   By: Elgie Collard M.D.   On: 05/22/2015 19:28  Ct Abdomen Pelvis W Contrast  05/15/2015   CLINICAL DATA:  Patient with rash and swelling to the bilateral feet. Lower abdominal pain for 1 month.  EXAM: CT ABDOMEN AND PELVIS WITH CONTRAST  TECHNIQUE: Multidetector CT imaging of the abdomen and pelvis was performed using the standard protocol following bolus administration of intravenous contrast.  CONTRAST:  OMNIPAQUE IOHEXOL 300 MG/ML SOLN, 25mL OMNIPAQUE IOHEXOL 300 MG/ML SOLN  COMPARISON:  MRI lumbar spine 05/15/2015  FINDINGS: Lower chest: Normal heart size. No consolidative or nodular pulmonary opacities. No pleural effusion.  Hepatobiliary: Liver is normal in size and contour without focal hepatic lesion identified. Gallbladder is decompressed. No intrahepatic or extrahepatic biliary ductal dilatation.  Pancreas: Unremarkable  Spleen: Multiple calcified granulomata.  Adrenals/Urinary Tract: Adrenal glands are normal. Kidneys enhance symmetrically with contrast. No hydronephrosis.  Stomach/Bowel: No abnormal bowel wall thickening or evidence for bowel obstruction. The appendix is normal. No free intraperitoneal  air.  Vascular/Lymphatic: There is significant mass effect on the left internal and external iliac veins from adjacent loculated fluid collection. Normal caliber abdominal aorta.  Other: None  Musculoskeletal: There is expansion of left psoas muscle from the approximate L2 vertebral level extending caudally were there is also expansion of the iliacus muscle. Near the junction there are multiple adjacent loculated appearing rim enhancing fluid collections with at least 1 of these collections appearing to contain small foci of gas. Collection within the left iliacus musculature measures 4.9 x 4.5 x 7.8 cm (image 67; series 2). Collections appear to extend into the left pelvic sidewall musculature. There appears to be some fat stranding about the proximal left lower extremity musculature. No definite osseous cortical destruction identified either involving the lower lumbar spine or pelvis.  IMPRESSION: Abnormal thickening and expansion of the left ileopsoas musculature which appears to contain multiple rim enhancing gas containing fluid collections most compatible with abscesses. It is not clear the exact origin of this apparent infectious process. No definite evidence for osseous erosion of the lumbar spine or pelvis on CT imaging.  The muscular expansion and abscesses exert significant mass effect on the left internal and external iliac veins.  Left hemi-pelvic and inguinal adenopathy likely reactive.   Electronically Signed   By: Annia Belt M.D.   On: 05/15/2015 19:29   US Renal  06/12/2015   CLINICAL DATA:  39 year old male with hematuria and acute renal failure  EXAM: RENAL / URINARY TRACT ULTRASOUND COMPLETE  COMPARISON:  CT scan abdomen/pelvis 06/09/2015 ; MRI lumbar spine 06/11/2015  FINDINGS: Right Kidney:  Length: 14.5 cm. Echogenic renal parenchyma with increased conspicuity of the corticomedullary junctions. No mass or hydronephrosis visualized.  Left Kidney:  Length: 14.2 cm. Echogenic renal parenchyma  with increased conspicuity of the corticomedullary junctions. No mass or hydronephrosis visualized.  Bladder:  Appears normal for degree of bladder distention. Both ureteral jets are visualized.  IMPRESSION: 1. No evidence of hydronephrosis. 2. Echogenic renal parenchyma bilaterally suggests underlying medical renal disease.   Electronically Signed   By: Malachy Moan M.D.   On: 06/12/2015 10:16   Dg Chest Port 1 View  06/11/2015   CLINICAL DATA:  Acute onset of nausea, upper back pain and headache. Initial encounter.  EXAM: PORTABLE CHEST - 1 VIEW  COMPARISON:  Chest radiograph performed 05/15/2015  FINDINGS: The lungs are well-aerated. Mild bibasilar opacities may reflect atelectasis or possibly mild pneumonia. There is no evidence of pleural effusion or pneumothorax.  The cardiomediastinal silhouette is within normal limits. No acute osseous  abnormalities are seen. A left PICC is noted ending about the cavoatrial junction.  IMPRESSION: Mild bibasilar airspace opacities may reflect atelectasis or possibly mild pneumonia.   Electronically Signed   By: Roanna Raider M.D.   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 Please perform CT-guided percutaneous drainage catheter placement.  EXAM: CT IMAGE GUIDED DRAINAGE BY PERCUTANEOUS CATHETER x2  COMPARISON:  Lumbar spine MRI - 05/15/2015; CT abdomen pelvis - 05/15/2015  MEDICATIONS: The patient is currently admitted to the hospital and receiving intravenous antibiotics. The antibiotics were administered within an appropriate time frame prior to the initiation of the procedure.  ANESTHESIA/SEDATION: Fentanyl 200 mcg IV; Versed 3 mg IV  Total Moderate Sedation time  30 minutes  CONTRAST:  None  COMPLICATIONS: None immediate  PROCEDURE: Informed  written consent was obtained from the patient after a discussion of the risks, benefits and alternatives to treatment. The patient was placed supine on the CT gantry and a pre procedural CT was performed re-demonstrating the known abscess/fluid collection within the caudal aspect of the iliopsoas and iliacus musculature with dominant cranial component measuring approximately 5.1 x 6.0 cm (image 22, series 2) and dominant caudal component measuring approximately 5.9 x 3.9 cm (image 36, series 2). The procedure was planned. A timeout was performed prior to the initiation of the procedure.  The skin overlying the anterior aspect of the left lower abdomen was prepped and draped in the usual sterile fashion.  Attention was initially paid towards the dominant caudal component located primarily within in the caudal aspect of the iliopsoas musculature. The overlying soft tissues were anesthetized with 1% lidocaine with epinephrine. Appropriate trajectory was planned with the use of a 22 gauge spinal needle. An 18 gauge trocar needle was advanced into the abscess/fluid collection and a short Amplatz super stiff wire was coiled within the collection. Appropriate positioning was confirmed with a limited CT scan. The tract was serially dilated allowing placement of a 10 [place] all-purpose drainage catheter. Appropriate positioning was confirmed with a limited postprocedural CT scan.  A total of approximately 110 cc of purulent fluid was aspirated. The tube was connected to a JP bulb and sutured in place.  Following the placement of the initial percutaneous drainage catheter, CT imaging was performed demonstrating grossly unchanged size and appearance of the separate dominant component located within the iliacus musculature and as such the decision was made to place an additional percutaneous drainage catheter.  The overlying soft tissues were anesthetized with 1% lidocaine with epinephrine. The overlying soft tissues were  anesthetized with 1% lidocaine with epinephrine. Appropriate trajectory was planned with the use of a 22 gauge spinal needle. An 18 gauge trocar needle was advanced into the abscess/fluid collection and a short Amplatz super stiff wire was coiled within the collection. Appropriate positioning was confirmed with a limited CT scan. The tract was serially dilated allowing placement of a 10 [place] all-purpose drainage catheter. Appropriate positioning was confirmed with a limited postprocedural CT scan.  A total of approximately 40 cc of purulent material was aspirated from the separate fluid collection. The tube was connected to a JP bulb and sutured in place.  Dressings were placed. The patient tolerated the procedure well without immediate postprocedural complication.  IMPRESSION: 1. Successful CT guided placement of a 10 [place]  all purpose drain catheter into the caudal aspect of the left iliopsoas musculature with aspiration of approximately 110 mL of purulent fluid. Samples were sent to the laboratory as requested by the ordering clinical team. 2. Successful CT-guided placement of a 10 [place] all-purpose drainage catheter into the dominant component within the left iliacus musculature with aspiration of approximately 40 cc of purulent material. Samples were sent to the laboratory as requested by the ordering clinical team.   Electronically Signed   By: Simonne Come M.D.   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 Please perform CT-guided percutaneous drainage catheter placement.  EXAM: CT IMAGE GUIDED DRAINAGE BY PERCUTANEOUS CATHETER x2  COMPARISON:  Lumbar spine MRI - 05/15/2015; CT abdomen pelvis - 05/15/2015  MEDICATIONS: The patient is currently admitted to the hospital and receiving  intravenous antibiotics. The antibiotics were administered within an appropriate time frame prior to the initiation of the procedure.  ANESTHESIA/SEDATION: Fentanyl 200 mcg IV; Versed 3 mg IV  Total Moderate Sedation time  30 minutes  CONTRAST:  None  COMPLICATIONS: None immediate  PROCEDURE: Informed written consent was obtained from the patient after a discussion of the risks, benefits and alternatives to treatment. The patient was placed supine on the CT gantry and a pre procedural CT was performed re-demonstrating the known abscess/fluid collection within the caudal aspect of the iliopsoas and iliacus musculature with dominant cranial component measuring approximately 5.1 x 6.0 cm (image 22, series 2) and dominant caudal component measuring approximately 5.9 x 3.9 cm (image 36, series 2). The procedure was planned. A timeout was performed prior to the initiation of the procedure.  The skin overlying the anterior aspect of the left lower abdomen was prepped and draped in the usual sterile fashion.  Attention was initially paid towards the dominant caudal component located primarily within in the caudal aspect of the iliopsoas musculature. The overlying soft tissues were anesthetized with 1% lidocaine with epinephrine. Appropriate trajectory was planned with the use of a 22 gauge spinal needle. An 18 gauge trocar needle was advanced into the abscess/fluid collection and a short Amplatz super stiff wire was coiled within the collection. Appropriate positioning was confirmed with a limited CT scan. The tract was serially dilated allowing placement of a 10 [place] all-purpose drainage catheter. Appropriate positioning was confirmed with a limited postprocedural CT scan.  A total of approximately 110 cc of purulent fluid was aspirated. The tube was connected to a JP bulb and sutured in place.  Following the placement of the initial percutaneous drainage catheter, CT imaging was performed demonstrating grossly unchanged  size and appearance of the separate dominant component located within the iliacus musculature and as such the decision was made to place an additional percutaneous drainage catheter.  The overlying soft tissues were anesthetized with 1% lidocaine with epinephrine. The overlying soft tissues were anesthetized with 1% lidocaine with epinephrine. Appropriate trajectory was planned with the use of a 22 gauge spinal needle. An 18 gauge trocar needle was advanced into the abscess/fluid collection and a short Amplatz super stiff wire was coiled within the collection. Appropriate positioning was confirmed with a limited CT scan. The tract was serially dilated allowing placement of a 10 [place] all-purpose drainage catheter. Appropriate positioning was confirmed with a limited postprocedural CT scan.  A total  of approximately 40 cc of purulent material was aspirated from the separate fluid collection. The tube was connected to a JP bulb and sutured in place.  Dressings were placed. The patient tolerated the procedure well without immediate postprocedural complication.  IMPRESSION: 1. Successful CT guided placement of a 10 [place] all purpose drain catheter into the caudal aspect of the left iliopsoas musculature with aspiration of approximately 110 mL of purulent fluid. Samples were sent to the laboratory as requested by the ordering clinical team. 2. Successful CT-guided placement of a 10 French all-purpose drainage catheter into the dominant component within the left iliacus musculature with aspiration of approximately 40 cc of purulent material. Samples were sent to the laboratory as requested by the ordering clinical team.   Electronically Signed   By: Simonne Come M.D.   On: 05/16/2015 16:42    Labs:  CBC:  Recent Labs  06/02/15 1435 06/09/15 1848 06/11/15 0345 06/12/15 0708  WBC 8.7 8.4 11.4* 6.6  HGB 10.1* 8.1* 8.4* 7.6*  HCT 31.6* 25.6* 25.4* 23.3*  PLT 310 248 243 165    COAGS:  Recent Labs   05/15/15 1625 06/12/15 0707  INR 1.33 1.19    BMP:  Recent Labs  06/09/15 1848 06/11/15 0345 06/11/15 1501 06/12/15 0708  NA 139 139 143 142  K 3.2* 2.7* 3.9 3.6  CL 101 104 110 109  CO2 26 27 26 27   GLUCOSE 104* 143* 96 110*  BUN 11 12 11 14   CALCIUM 8.3* 8.2* 8.1* 8.1*  CREATININE 1.20 1.51* 1.70* 1.66*  GFRNONAA >60 58* 50* 52*  GFRAA >60 >60 58* 60*    LIVER FUNCTION TESTS:  Recent Labs  06/02/15 1435 06/09/15 1848 06/11/15 0345 06/12/15 0708  BILITOT 0.5 0.5 0.6 0.3  AST 25 12* 12* 22  ALT 39 13* 11* 17  ALKPHOS 293* 164* 163* 242*  PROT 7.5 6.5 7.0 6.2*  ALBUMIN 2.7* 2.5* 2.6* 2.4*    TUMOR MARKERS: No results for input(s): AFPTM, CEA, CA199, CHROMGRNA in the last 8760 hours.  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 All of the patient's questions were answered, patient is agreeable to proceed. Consent signed and in chart. . Thank you for this interesting consult.  I greatly enjoyed meeting ERNESTINE LANGWORTHY and look forward to participating in their care.  A copy of this report was sent to the requesting provider on this date.  Signed: Helaina Stefano A 06/12/2015, 2:37 PM   I spent a total of 40 Minutes    in face to face in clinical consultation, greater than 50% of which was counseling/coordinating care for psoas abscess aspiration

## 2015-06-12 NOTE — Plan of Care (Signed)
Problem: Phase I Progression Outcomes Goal: Pain controlled with appropriate interventions Outcome: Progressing Patient still having severe pain, communicated with NP, frequency increased for PRN pain meds to help better control  Problem: Phase II Progression Outcomes Goal: Progress activity as tolerated unless otherwise ordered Outcome: Completed/Met Date Met:  06/12/15 Patient walking halls independently and frequently

## 2015-06-12 NOTE — Sedation Documentation (Signed)
Vital signs stable. 

## 2015-06-12 NOTE — Progress Notes (Addendum)
CRITICAL VALUE ALERT  Critical value received:  Yeast recovered from blood cltures  Date of notification:  06/12/15  Time of notification:  0945  Critical value read back:Yes.    Nurse who received alert:  Sherrye Payor RN  MD notified (1st page):  Dr Irene Limbo  Time of first page:  1150  MD notified (2nd page):  Time of second page:  Responding MD:    Time MD responded:    MD paged to make aware

## 2015-06-12 NOTE — Consult Note (Signed)
Urology Consult  Referring physician: Dr. Sarajane Jews Reason for referral: Gross Hematuria  Chief Complaint: Gross Hematuria  History of Present Illness: Mr [name] is a 39yo with new onset gross painless hematuria. He had no medical issues until recently when he was admitted with a paraspinal abscess and has been treated with IV antibiotics including vancomycin. He was discharged on vancomycin. He had not been drinking enough water and was admitted with elevated creatinine, sepsis, and gross hematuria. His hematuria started 3 days ago. He denies any lower urinary tract symptoms. He has no hx of nephrolithiasis. He underwent multiple contrasted CT scan recently which were normal from a GU standpoint. He has anemia related to his chronic infection, with his last platelet count of 165.  His WBC count is 6.6 and he had a fever today of 102.  Past Medical History  Diagnosis Date  . H/O back injury   . MRSA (methicillin resistant Staphylococcus aureus)   . 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 MRSA bacteremia 04/2015   Past Surgical History  Procedure Laterality Date  . Skin biopsy    . Abdmoninal drain insertion      Medications: I have reviewed the patient's current medications. Allergies:  Allergies  Allergen Reactions  . Vicodin [Hydrocodone-Acetaminophen] Hives and Itching  . Adhesive [Tape] Rash    Family History  Problem Relation Age of Onset  . Cancer Father    Social History:  reports that he has been smoking Cigarettes.  He has a 10 pack-year smoking history. He has never used smokeless tobacco. He reports that he does not drink alcohol or use illicit drugs.  Review of Systems  Genitourinary: Positive for hematuria. Negative for dysuria, urgency, frequency and flank pain.  Musculoskeletal: Positive for back pain and joint pain.  All other systems reviewed and are negative.   Physical Exam:  Vital signs in last 24 hours: Temp:  [98 F (36.7 C)-100.2 F (37.9 C)] 98.1 F  (36.7 C) (07/21 2137) Pulse Rate:  [61-85] 85 (07/21 2137) Resp:  [12-21] 18 (07/21 2137) BP: (133-150)/(83-104) 140/91 mmHg (07/21 2137) SpO2:  [98 %-100 %] 100 % (07/21 2137) Physical Exam  Constitutional: He is oriented to person, place, and time. He appears well-developed and well-nourished.  HENT:  Head: Normocephalic and atraumatic.  Eyes: EOM are normal. Pupils are equal, round, and reactive to light.  Neck: Normal range of motion. Neck supple. No tracheal deviation present. No thyromegaly present.  Cardiovascular: Normal rate and regular rhythm.   Respiratory: Effort normal and breath sounds normal.  GI: Soft. Bowel sounds are normal. He exhibits no distension. There is no tenderness. Hernia confirmed negative in the right inguinal area and confirmed negative in the left inguinal area.  Genitourinary: Testes normal and penis normal. Right testis shows no mass, no swelling and no tenderness. Left testis shows no mass, no swelling and no tenderness. Circumcised. No penile erythema or penile tenderness. No discharge found.  Musculoskeletal: Normal range of motion.  Neurological: He is alert and oriented to person, place, and time.  Skin: Skin is dry.  Psychiatric: He has a normal mood and affect. His behavior is normal. Judgment and thought content normal.    Laboratory Data:  Results for orders placed or performed during the hospital encounter of 06/11/15 (from the past 72 hour(s))  Culture, blood (routine x 2)     Status: None (Preliminary result)   Collection Time: 06/11/15  3:45 AM  Result Value Ref Range   Specimen  Description BLOOD LEFT ANTECUBITAL DRAWN BY RN    Special Requests      BOTTLES DRAWN AEROBIC AND ANAEROBIC AEB=8CC ANA=10CC   Culture  Setup Time      YEAST RECOVERED FROM THE AEROBIC BOTTLE Gram Stain Report Called to,Read Back By and Verified With: BULLINS,M. AT 0945 ON 06/12/2015 BY RESSEGGER,R. Performed at [facility]    Culture NO GROWTH 1 DAY     Report Status PENDING   CBC with Differential     Status: Abnormal   Collection Time: 06/11/15  3:45 AM  Result Value Ref Range   WBC 11.4 (H) 4.0 - 10.5 K/uL   RBC 3.12 (L) 4.22 - 5.81 MIL/uL   Hemoglobin 8.4 (L) 13.0 - 17.0 g/dL   HCT 25.4 (L) 39.0 - 52.0 %   MCV 81.4 78.0 - 100.0 fL   MCH 26.9 26.0 - 34.0 pg   MCHC 33.1 30.0 - 36.0 g/dL   RDW 13.7 11.5 - 15.5 %   Platelets 243 150 - 400 K/uL   Neutrophils Relative % 85 (H) 43 - 77 %   Neutro Abs 9.6 (H) 1.7 - 7.7 K/uL   Lymphocytes Relative 8 (L) 12 - 46 %   Lymphs Abs 0.9 0.7 - 4.0 K/uL   Monocytes Relative 7 3 - 12 %   Monocytes Absolute 0.8 0.1 - 1.0 K/uL   Eosinophils Relative 0 0 - 5 %   Eosinophils Absolute 0.0 0.0 - 0.7 K/uL   Basophils Relative 0 0 - 1 %   Basophils Absolute 0.0 0.0 - 0.1 K/uL  Comprehensive metabolic panel     Status: Abnormal   Collection Time: 06/11/15  3:45 AM  Result Value Ref Range   Sodium 139 135 - 145 mmol/L   Potassium 2.7 (LL) 3.5 - 5.1 mmol/L    Comment: CRITICAL RESULT CALLED TO, READ BACK BY AND VERIFIED WITH: TALBOTT,T AT 4:20AM ON 06/11/15 BY FESTERMAN,C    Chloride 104 101 - 111 mmol/L   CO2 27 22 - 32 mmol/L   Glucose, Bld 143 (H) 65 - 99 mg/dL   BUN 12 6 - 20 mg/dL   Creatinine, Ser 1.51 (H) 0.61 - 1.24 mg/dL   Calcium 8.2 (L) 8.9 - 10.3 mg/dL   Total Protein 7.0 6.5 - 8.1 g/dL   Albumin 2.6 (L) 3.5 - 5.0 g/dL   AST 12 (L) 15 - 41 U/L   ALT 11 (L) 17 - 63 U/L   Alkaline Phosphatase 163 (H) 38 - 126 U/L   Total Bilirubin 0.6 0.3 - 1.2 mg/dL   GFR calc non Af Amer 58 (L) >60 mL/min   GFR calc Af Amer >60 >60 mL/min    Comment: (NOTE) The eGFR has been calculated using the CKD EPI equation. This calculation has not been validated in all clinical situations. eGFR's persistently <60 mL/min signify possible Chronic Kidney Disease.    Anion gap 8 5 - 15  Culture, blood (routine x 2)     Status: None (Preliminary result)   Collection Time: 06/11/15  3:55 AM  Result Value  Ref Range   Specimen Description BLOOD PICC LINE DRAWN BY RN TT    Special Requests BOTTLES DRAWN AEROBIC ONLY 8CC    Culture  Setup Time      YEAST RECOVERED FORM THE AEROBIC BOTTLE Gram Stain Report Called to,Read Back By and Verified With: BULLINS,M. AT 0945 ON 06/12/2015 BY RESSEGGER,R. IN BOTH AEROBIC AND ANAEROBIC BOTTLES Performed at [facility]  Culture PENDING    Report Status PENDING   I-Stat CG4 Lactic Acid, ED     Status: None   Collection Time: 06/11/15  3:59 AM  Result Value Ref Range   Lactic Acid, Venous 0.67 0.5 - 2.0 mmol/L  Urinalysis, Routine w reflex microscopic (not at [facility])     Status: Abnormal   Collection Time: 06/11/15  4:55 AM  Result Value Ref Range   Color, Urine RED (A) YELLOW    Comment: BIOCHEMICALS MAY BE AFFECTED BY COLOR   APPearance CLEAR CLEAR   Specific Gravity, Urine <1.005 (L) 1.005 - 1.030   pH 6.5 5.0 - 8.0   Glucose, UA NEGATIVE NEGATIVE mg/dL   Hgb urine dipstick LARGE (A) NEGATIVE   Bilirubin Urine NEGATIVE NEGATIVE   Ketones, ur NEGATIVE NEGATIVE mg/dL   Protein, ur TRACE (A) NEGATIVE mg/dL   Urobilinogen, UA 0.2 0.0 - 1.0 mg/dL   Nitrite NEGATIVE NEGATIVE   Leukocytes, UA NEGATIVE NEGATIVE  Urine microscopic-add on     Status: Abnormal   Collection Time: 06/11/15  4:55 AM  Result Value Ref Range   Squamous Epithelial / LPF RARE RARE   WBC, UA 0-2 <3 WBC/hpf   RBC / HPF TOO NUMEROUS TO COUNT <3 RBC/hpf   Bacteria, UA FEW (A) RARE  Sedimentation rate     Status: Abnormal   Collection Time: 06/11/15  6:27 AM  Result Value Ref Range   Sed Rate 115 (H) 0 - 16 mm/hr  Reticulocytes     Status: Abnormal   Collection Time: 06/11/15  6:27 AM  Result Value Ref Range   Retic Ct Pct 0.9 0.4 - 3.1 %   RBC. 3.10 (L) 4.22 - 5.81 MIL/uL   Retic Count, Manual 27.9 19.0 - 186.0 K/uL  Troponin I     Status: None   Collection Time: 06/11/15  6:34 AM  Result Value Ref Range   Troponin I <0.03 <0.031 ng/mL    Comment:        NO  INDICATION OF MYOCARDIAL INJURY.   Basic metabolic panel     Status: Abnormal   Collection Time: 06/11/15  3:01 PM  Result Value Ref Range   Sodium 143 135 - 145 mmol/L   Potassium 3.9 3.5 - 5.1 mmol/L    Comment: DELTA CHECK NOTED   Chloride 110 101 - 111 mmol/L   CO2 26 22 - 32 mmol/L   Glucose, Bld 96 65 - 99 mg/dL   BUN 11 6 - 20 mg/dL   Creatinine, Ser 1.70 (H) 0.61 - 1.24 mg/dL   Calcium 8.1 (L) 8.9 - 10.3 mg/dL   GFR calc non Af Amer 50 (L) >60 mL/min   GFR calc Af Amer 58 (L) >60 mL/min    Comment: (NOTE) The eGFR has been calculated using the CKD EPI equation. This calculation has not been validated in all clinical situations. eGFR's persistently <60 mL/min signify possible Chronic Kidney Disease.    Anion gap 7 5 - 15  Vitamin B12     Status: Abnormal   Collection Time: 06/11/15  3:01 PM  Result Value Ref Range   Vitamin B-12 143 (L) 180 - 914 pg/mL    Comment: (NOTE) This assay is not validated for testing neonatal or myeloproliferative syndrome specimens for Vitamin B12 levels. Performed at [facility]   Folate     Status: None   Collection Time: 06/11/15  3:01 PM  Result Value Ref Range   Folate 23.0 >5.9 ng/mL  Comment: Performed at [facility]  Iron and TIBC     Status: Abnormal   Collection Time: 06/11/15  3:01 PM  Result Value Ref Range   Iron 8 (L) 45 - 182 ug/dL   TIBC 220 (L) 250 - 450 ug/dL   Saturation Ratios 4 (L) 17.9 - 39.5 %   UIBC 212 ug/dL    Comment: Performed at [facility]  Ferritin     Status: None   Collection Time: 06/11/15  3:01 PM  Result Value Ref Range   Ferritin 138 24 - 336 ng/mL    Comment: Performed at [facility]     Status: Abnormal   Collection Time: 06/12/15  7:07 AM  Result Value Ref Range   Prothrombin Time 15.3 (H) 11.6 - 15.2 seconds   INR 1.19 0.00 - 1.49  Comprehensive metabolic panel     Status: Abnormal   Collection Time: 06/12/15  7:08 AM  Result Value Ref  Range   Sodium 142 135 - 145 mmol/L   Potassium 3.6 3.5 - 5.1 mmol/L   Chloride 109 101 - 111 mmol/L   CO2 27 22 - 32 mmol/L   Glucose, Bld 110 (H) 65 - 99 mg/dL   BUN 14 6 - 20 mg/dL   Creatinine, Ser 1.66 (H) 0.61 - 1.24 mg/dL   Calcium 8.1 (L) 8.9 - 10.3 mg/dL   Total Protein 6.2 (L) 6.5 - 8.1 g/dL   Albumin 2.4 (L) 3.5 - 5.0 g/dL   AST 22 15 - 41 U/L   ALT 17 17 - 63 U/L   Alkaline Phosphatase 242 (H) 38 - 126 U/L   Total Bilirubin 0.3 0.3 - 1.2 mg/dL   GFR calc non Af Amer 52 (L) >60 mL/min   GFR calc Af Amer 60 (L) >60 mL/min    Comment: (NOTE) The eGFR has been calculated using the CKD EPI equation. This calculation has not been validated in all clinical situations. eGFR's persistently <60 mL/min signify possible Chronic Kidney Disease.    Anion gap 6 5 - 15  CBC     Status: Abnormal   Collection Time: 06/12/15  7:08 AM  Result Value Ref Range   WBC 6.6 4.0 - 10.5 K/uL   RBC 2.80 (L) 4.22 - 5.81 MIL/uL   Hemoglobin 7.6 (L) 13.0 - 17.0 g/dL   HCT 23.3 (L) 39.0 - 52.0 %   MCV 83.2 78.0 - 100.0 fL   MCH 27.1 26.0 - 34.0 pg   MCHC 32.6 30.0 - 36.0 g/dL   RDW 14.0 11.5 - 15.5 %   Platelets 165 150 - 400 K/uL   Recent Results (from the past 240 hour(s))  Urine culture     Status: None   Collection Time: 06/09/15  7:47 PM  Result Value Ref Range Status   Specimen Description URINE, CLEAN CATCH  Final   Special Requests NONE  Final   Culture NO GROWTH 1 DAY  Final   Report Status 06/11/2015 FINAL  Final  Culture, blood (routine x 2)     Status: None (Preliminary result)   Collection Time: 06/11/15  3:45 AM  Result Value Ref Range Status   Specimen Description BLOOD LEFT ANTECUBITAL DRAWN BY RN  Final   Special Requests   Final    BOTTLES DRAWN AEROBIC AND ANAEROBIC AEB=8CC ANA=10CC   Culture  Setup Time   Final    YEAST RECOVERED FROM THE AEROBIC BOTTLE Gram Stain Report Called to,Read Back  By and Verified With: [place] ON 06/12/2015 BY  RESSEGGER,R. Performed at [place] 1 DAY  Final   Report Status PENDING  Incomplete  Culture, blood (routine x 2)     Status: None (Preliminary result)   Collection Time: 06/11/15  3:55 AM  Result Value Ref Range Status   Specimen Description BLOOD PICC LINE DRAWN BY RN TT  Final   Special Requests BOTTLES DRAWN AEROBIC ONLY 8CC  Final   Culture  Setup Time   Final    YEAST RECOVERED FORM THE AEROBIC BOTTLE Gram Stain Report Called to,Read Back By and Verified With: BULLINS,M. AT 9643 ON 06/12/2015 BY RESSEGGER,R. IN BOTH AEROBIC AND ANAEROBIC BOTTLES Performed at [facility]    Culture PENDING  Incomplete   Report Status PENDING  Incomplete   Creatinine:  Recent Labs  06/09/15 1848 06/11/15 0345 06/11/15 1501 06/12/15 0708  CREATININE 1.20 1.51* 1.70* 1.66*   Baseline Creatinine:   Impression/Assessment:  Gross hematuria, acute renal failure  Plan:  1. 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 Would recommend IV hydration and hematuria will likely resolve 2. Pt will need formal hematuria workup as an outpatient which will include office cystoscopy. Please schedule follwoup 2 weeks after discharge.  3. Please call with any additional questions   Alleigh Mollica L 06/12/2015, 10:10 PM

## 2015-06-12 NOTE — Progress Notes (Signed)
Patient ID: [name], male   DOB: May 01, 1976, 39 y.o.   MRN: [id number]   Psoas abscess Request for drain placement  Dr Deanne Coffer has reviewed imaging Collections too small for drain Plan for aspiration of abscess (es)   To be at [facility] today by noon via ambulance Return to [facility]  RN aware

## 2015-06-12 NOTE — Sedation Documentation (Addendum)
Patient states pain 10/10

## 2015-06-13 ENCOUNTER — Inpatient Hospital Stay (HOSPITAL_COMMUNITY): Payer: Medicaid Other | Admitting: Anesthesiology

## 2015-06-13 ENCOUNTER — Inpatient Hospital Stay (HOSPITAL_COMMUNITY): Payer: Medicaid Other

## 2015-06-13 ENCOUNTER — Encounter (HOSPITAL_COMMUNITY)
Admission: EM | Disposition: A | Discharge status: Home / Self Care | Payer: Self-pay | Source: Home / Self Care | Attending: Family Medicine

## 2015-06-13 ENCOUNTER — Encounter (HOSPITAL_COMMUNITY): Payer: Self-pay | Admitting: *Deleted

## 2015-06-13 DIAGNOSIS — I288 Other diseases of pulmonary vessels: Secondary | ICD-10-CM

## 2015-06-13 DIAGNOSIS — B377 Candidal sepsis: Secondary | ICD-10-CM

## 2015-06-13 HISTORY — PX: TEE WITHOUT CARDIOVERSION: SHX5443

## 2015-06-13 LAB — URINALYSIS, ROUTINE W REFLEX MICROSCOPIC
Bilirubin Urine: NEGATIVE
Glucose, UA: NEGATIVE mg/dL
Ketones, ur: NEGATIVE mg/dL
Leukocytes, UA: NEGATIVE
NITRITE: NEGATIVE
PH: 5.5 (ref 5.0–8.0)
PROTEIN: NEGATIVE mg/dL
SPECIFIC GRAVITY, URINE: 1.015 (ref 1.005–1.030)
Urobilinogen, UA: 0.2 mg/dL (ref 0.0–1.0)

## 2015-06-13 LAB — BASIC METABOLIC PANEL
Anion gap: 8 (ref 5–15)
BUN: 15 mg/dL (ref 6–20)
CHLORIDE: 108 mmol/L (ref 101–111)
CO2: 26 mmol/L (ref 22–32)
Calcium: 8.3 mg/dL — ABNORMAL LOW (ref 8.9–10.3)
Creatinine, Ser: 1.77 mg/dL — ABNORMAL HIGH (ref 0.61–1.24)
GFR calc Af Amer: 55 mL/min — ABNORMAL LOW (ref >60)
GFR, EST NON AFRICAN AMERICAN: 48 mL/min — AB (ref >60)
Glucose, Bld: 106 mg/dL — ABNORMAL HIGH (ref 65–99)
POTASSIUM: 3.5 mmol/L (ref 3.5–5.1)
Sodium: 142 mmol/L (ref 135–145)

## 2015-06-13 LAB — CBC
HCT: 25.2 % — ABNORMAL LOW (ref 39.0–52.0)
HEMOGLOBIN: 8 g/dL — AB (ref 13.0–17.0)
MCH: 26.3 pg (ref 26.0–34.0)
MCHC: 31.7 g/dL (ref 30.0–36.0)
MCV: 82.9 fL (ref 78.0–100.0)
Platelets: 262 10*3/uL (ref 150–400)
RBC: 3.04 MIL/uL — AB (ref 4.22–5.81)
RDW: 13.9 % (ref 11.5–15.5)
WBC: 9.1 10*3/uL (ref 4.0–10.5)

## 2015-06-13 LAB — URINE MICROSCOPIC-ADD ON

## 2015-06-13 LAB — VANCOMYCIN, TROUGH: Vancomycin Tr: 38 ug/mL (ref 10.0–20.0)

## 2015-06-13 SURGERY — ECHOCARDIOGRAM, TRANSESOPHAGEAL
Anesthesia: Monitor Anesthesia Care

## 2015-06-13 MED ORDER — FENTANYL CITRATE (PF) 100 MCG/2ML IJ SOLN
INTRAMUSCULAR | Status: AC
  Filled 2015-06-13: qty 2

## 2015-06-13 MED ORDER — HYDROMORPHONE HCL 1 MG/ML IJ SOLN
0.5000 mg | Freq: Once | INTRAMUSCULAR | Status: AC
  Administered 2015-06-13: 0.5 mg via INTRAVENOUS
  Filled 2015-06-13: qty 1

## 2015-06-13 MED ORDER — FENTANYL CITRATE (PF) 100 MCG/2ML IJ SOLN
INTRAMUSCULAR | Status: DC | PRN
  Administered 2015-06-13 (×4): 25 ug via INTRAVENOUS

## 2015-06-13 MED ORDER — PROPOFOL 10 MG/ML IV BOLUS
INTRAVENOUS | Status: AC
  Filled 2015-06-13: qty 20

## 2015-06-13 MED ORDER — PROPOFOL INFUSION 10 MG/ML OPTIME
INTRAVENOUS | Status: DC | PRN
  Administered 2015-06-13: 150 ug/kg/min via INTRAVENOUS

## 2015-06-13 MED ORDER — LIDOCAINE HCL (CARDIAC) 10 MG/ML IV SOLN
INTRAVENOUS | Status: DC | PRN
  Administered 2015-06-13: 50 mg via INTRAVENOUS

## 2015-06-13 MED ORDER — SODIUM CHLORIDE 0.9 % IV SOLN
1000.0000 mL | INTRAVENOUS | Status: DC
  Administered 2015-06-13 – 2015-06-19 (×13): 1000 mL via INTRAVENOUS

## 2015-06-13 MED ORDER — SODIUM CHLORIDE 0.9 % IV SOLN
INTRAVENOUS | Status: DC
  Administered 2015-06-13: 16:00:00 via INTRAVENOUS

## 2015-06-13 MED ORDER — ONDANSETRON HCL 4 MG/2ML IJ SOLN
INTRAMUSCULAR | Status: AC
  Filled 2015-06-13: qty 2

## 2015-06-13 MED ORDER — MIDAZOLAM HCL 2 MG/2ML IJ SOLN
INTRAMUSCULAR | Status: AC
  Filled 2015-06-13: qty 2

## 2015-06-13 MED ORDER — SODIUM CHLORIDE 0.9 % IV SOLN
6.0000 mg/kg | INTRAVENOUS | Status: DC
  Administered 2015-06-13 – 2015-06-19 (×7): 435.5 mg via INTRAVENOUS
  Filled 2015-06-13 (×8): qty 8.71

## 2015-06-13 NOTE — Progress Notes (Signed)
Second episode of smoke being in room. Pt educated that [facility] is a smoke free campus. Press photographer and registered nurse educated pt. Security notified.

## 2015-06-13 NOTE — Anesthesia Postprocedure Evaluation (Signed)
  Anesthesia Post-op Note  Patient: [name]  Procedure(s) Performed: Procedure(s): TRANSESOPHAGEAL ECHOCARDIOGRAM (TEE) (N/A)  Patient Location: PACU  Anesthesia Type:MAC  Level of Consciousness: awake, alert , oriented and patient cooperative  Airway and Oxygen Therapy: Patient Spontanous Breathing and Patient connected to face mask oxygen  Post-op Pain: none  Post-op Assessment: Post-op Vital signs reviewed, Patient's Cardiovascular Status Stable, Respiratory Function Stable, Patent Airway, No signs of Nausea or vomiting and Pain level controlled              Post-op Vital Signs: Reviewed and stable  Last Vitals:  Filed Vitals:   06/13/15 1333  BP: 166/107  Pulse: 69  Temp: 36.6 C  Resp: 18    Complications: No apparent anesthesia complications

## 2015-06-13 NOTE — Progress Notes (Signed)
[facility] PROGRESS NOTE  [name] ZOX:[id number] DOB: 11-Apr-1976 DOA: 06/11/2015 PCP: Doris Cheadle, MD  Assessment/Plan: Multiple paraspinal abscesses/septic arthritis SI joint: per MRI. Exam consistent with imaging. Hx of same with drains. SP aspiration per IR. Await culture. Blood culture +yeast.  Awaiting results  TEE. Vancomycin and zosyn discontinued 06/12/15. Daptomycin started today per Dr Ninetta Lights with ID.  Currently he is afebrile and non-toxic. Hemodynamically stable. Monitor closely  Fever: likely related to above. Blood cultures with yeast x2.  PICC removed (not being used). Continue  Eraxis day #2. Urine with negative leukocytes and few bacteria 0-2 WBC.  Physical exam not consistent with pneumonia. Hx MRSA bacteremia as well as #1. Antibiotics as above. Remains hemodynamically stable not toxic appearing. See #1.   Acute kidney injury: likely related to decreased oral intake in setting of above in setting of Vancomycin.Creatinine trending up somewhat.  chart review indicates creatinine with in limits of normal 2 weeks ago. Renal US without hydronephrosis. Evaluated by nephrology who opine possible etiology contrast induced renal failure/prerenal syndrome/ATN/Vancomycin induced. Recommended increase in IV fluids and further work up with ANS, Hep B surface antigen, Hep C antibody ANCA and ASO titer. Will monitor  Hematuria: per urinalysis. Chart review indicates present since 04/2015. Etiology uncertain. Urine output good. Hg stable at 8.0.Renal US as above. Evaluated by urology who opine hematuria may be related to vancomycin and dehydration. Recommended IV fluids and cystoscopy 2 weeks after discharge.   Anemia: likely acute blood loss in setting of subacute illness. B12 143, iron 8. .   Tachycardia: related to #1 and #2 and #6. Troponin negative. Resolved.  Hypokalemia: repleted and resolved. Will monitor.  Code Status: full Family Communication: wife at  bedside Disposition Plan: home when ready   Consultants:  Urology  Nephrology  ID on phone  IR   ortho  Procedures:  Aspiration of abscess  TEE  Antibiotics:  Vancomycin 06/10/15>>06/12/15  levaquin 7/19-7/20  Zosyn 06/11/15>>06/12/15  Daptomycin 06/12/15>>  HPI/Subjective: Sitting in chair. Reports difficult night as pain uncontrolled as lost IV and had no pain medicine for 3 hours  Objective: Filed Vitals:   06/12/15 2137  BP: 140/91  Pulse: 85  Temp: 98.1 F (36.7 C)  Resp: 18    Intake/Output Summary (Last 24 hours) at 06/13/15 1254 Last data filed at 06/13/15 1058  Gross per 24 hour  Intake 2132.5 ml  Output    800 ml  Net 1332.5 ml   Filed Weights   06/11/15 0234  Weight: 72.576 kg (160 lb)    Exam:   General:  Well nourished appears somewhat ill but not toxic  Cardiovascular: RRR no MGR no LE edema  Respiratory: normal effort BS clear to auscultation bilaterally no wheeze  Abdomen: non-distended non-tender +BS  Musculoskeletal: low back remains tender to touch. No swelling erythema   Data Reviewed: Basic Metabolic Panel:  Recent Labs Lab 06/09/15 1848 06/11/15 0345 06/11/15 1501 06/12/15 0708 06/13/15 0623  NA 139 139 143 142 142  K 3.2* 2.7* 3.9 3.6 3.5  CL 101 104 110 109 108  CO2 26 27 26 27 26   GLUCOSE 104* 143* 96 110* 106*  BUN 11 12 11 14 15   CREATININE 1.20 1.51* 1.70* 1.66* 1.77*  CALCIUM 8.3* 8.2* 8.1* 8.1* 8.3*   Liver Function Tests:  Recent Labs Lab 06/09/15 1848 06/11/15 0345 06/12/15 0708  AST 12* 12* 22  ALT 13* 11* 17  ALKPHOS 164* 163* 242*  BILITOT 0.5 0.6 0.3  PROT 6.5 7.0 6.2*  ALBUMIN 2.5* 2.6* 2.4*    Recent Labs Lab 06/09/15 1848  LIPASE 16*   No results for input(s): AMMONIA in the last 168 hours. CBC:  Recent Labs Lab 06/09/15 1848 06/11/15 0345 06/12/15 0708 06/13/15 0623  WBC 8.4 11.4* 6.6 9.1  NEUTROABS  --  9.6*  --   --   HGB 8.1* 8.4* 7.6* 8.0*  HCT 25.6* 25.4*  23.3* 25.2*  MCV 81.3 81.4 83.2 82.9  PLT 248 243 165 262   Cardiac Enzymes:  Recent Labs Lab 06/11/15 0634  TROPONINI <0.03   BNP (last 3 results) No results for input(s): BNP in the last 8760 hours.  ProBNP (last 3 results) No results for input(s): PROBNP in the last 8760 hours.  CBG: No results for input(s): GLUCAP in the last 168 hours.  Recent Results (from the past 240 hour(s))  Urine culture     Status: None   Collection Time: 06/09/15  7:47 PM  Result Value Ref Range Status   Specimen Description URINE, CLEAN CATCH  Final   Special Requests NONE  Final   Culture NO GROWTH 1 DAY  Final   Report Status 06/11/2015 FINAL  Final  Culture, blood (routine x 2)     Status: None (Preliminary result)   Collection Time: 06/11/15  3:45 AM  Result Value Ref Range Status   Specimen Description BLOOD LEFT ANTECUBITAL DRAWN BY RN  Final   Special Requests   Final    BOTTLES DRAWN AEROBIC AND ANAEROBIC AEB=8CC ANA=10CC   Culture  Setup Time   Final    YEAST RECOVERED FROM THE AEROBIC BOTTLE Gram Stain Report Called to,Read Back By and Verified With: BULLINS,M. AT 0945 ON 06/12/2015 BY RESSEGGER,R. Performed at [facility]    Culture NO GROWTH 1 DAY  Final   Report Status PENDING  Incomplete  Culture, blood (routine x 2)     Status: None (Preliminary result)   Collection Time: 06/11/15  3:55 AM  Result Value Ref Range Status   Specimen Description BLOOD PICC LINE DRAWN BY RN TT  Final   Special Requests BOTTLES DRAWN AEROBIC ONLY 8CC  Final   Culture  Setup Time   Final    YEAST RECOVERED FORM THE AEROBIC BOTTLE Gram Stain Report Called to,Read Back By and Verified With: BULLINS,M. AT 0945 ON 06/12/2015 BY RESSEGGER,R.    Culture YEAST Performed at [facility]   Final   Report Status PENDING  Incomplete  Culture, routine-abscess     Status: None (Preliminary result)   Collection Time: 06/12/15  4:10 PM  Result Value Ref Range Status   Specimen  Description ABSCESS  Final   Special Requests PSOAS LEFT MUSCLE  Final   Gram Stain   Final    MODERATE WBC PRESENT,BOTH PMN AND MONONUCLEAR NO SQUAMOUS EPITHELIAL CELLS SEEN NO ORGANISMS SEEN Performed at [facility]    Culture NO GROWTH Performed at [facility]   Final   Report Status PENDING  Incomplete     Studies: Mr Lumbar Spine W Wo Contrast  06/11/2015   CLINICAL DATA:  Fever. Continued low back pain. History of abscess drainage.  EXAM: MRI LUMBAR SPINE WITHOUT AND WITH CONTRAST  TECHNIQUE: Multiplanar and multiecho pulse sequences of the lumbar spine were obtained without and with intravenous contrast.  CONTRAST:  15mL MULTIHANCE GADOBENATE DIMEGLUMINE 529 MG/ML IV SOLN  COMPARISON:  06/02/2015 lumbar spine MRI. 06/09/2015 CT abdomen and pelvis.  FINDINGS:  Vertebral alignment is unchanged with slight retrolisthesis of L5 on S1. Vertebral body heights are preserved. Disc desiccation and mild disc space narrowing are present at L4-5 and L5-S1, similar to prior. No vertebral marrow edema is seen in the lumbar spine. Conus medullaris is normal in signal and terminates at L1.  T11-12: Only imaged sagittally. Mild disc bulging results in at most mild spinal canal and neural foraminal narrowing, unchanged.  T12-L1:  Only imaged sagittally.  No disc herniation or stenosis.  L1-2 through L3-4:  Negative.  L4-5: Mild circumferential disc bulging, small superimposed central disc protrusion, and mild facet hypertrophy result in minimal spinal canal narrowing and minimal bilateral neural foraminal narrowing, unchanged. Minimal residual dorsal epidural enhancement at the site of prior phlegmon has decreased from prior.  L5-S1: Mild circumferential disc bulging, broad central disc protrusion, and mild facet hypertrophy result in mild right greater than left lateral recess stenosis and mild bilateral neural foraminal stenosis, unchanged. No spinal stenosis.  Marrow edema about the left SI  joint involving the left sacral ala and iliac bone does not appear significantly different than on the prior MRI. Erosive changes and fluid are again noted in the left SI joint. Edema is again seen in the soft tissues surrounding the left SI joint including the iliopsoas and gluteal musculature, lower lumbar paraspinal musculature, and other surrounding soft tissues.  Fluid collection at the superior aspect of the left iliacus muscle has enlarged from the prior MRI, now measuring 2.1 x 1.9 cm (previously 1.4 x 0.8 cm). Fluid tracks inferiorly from this collection along the anterior aspect of the left SI joint, and there is a new 1.2 cm fluid collection just superior to the left pelvic sidewall posterior to the left external iliac vein (series 7, image 49). Extensive surrounding phlegmon is again seen about the left iliopsoas musculature. Trace free fluid is noted in the right mid abdomen near the tip of the liver, grossly stable to minimally increased compared to that on prior CT.  IMPRESSION: 1. Persistent extensive marrow and soft tissue edema about the left SI joint consistent with septic arthritis and surrounding myositis and soft tissue phlegmon. 2. 2.1 cm fluid collection/abscess at the superior aspect of the left iliacus, larger than on prior MRI. 3. New 1.2 cm collection just superior to the left pelvic sidewall. 4. No evidence of new infection in the lumbar spine. Minimal residual dorsal epidural enhancement at L4-5.   Electronically Signed   By: Sebastian Ache   On: 06/11/2015 14:01   US Renal  06/12/2015   CLINICAL DATA:  39 year old male with hematuria and acute renal failure  EXAM: RENAL / URINARY TRACT ULTRASOUND COMPLETE  COMPARISON:  CT scan abdomen/pelvis 06/09/2015 ; MRI lumbar spine 06/11/2015  FINDINGS: Right Kidney:  Length: 14.5 cm. Echogenic renal parenchyma with increased conspicuity of the corticomedullary junctions. No mass or hydronephrosis visualized.  Left Kidney:  Length: 14.2 cm.  Echogenic renal parenchyma with increased conspicuity of the corticomedullary junctions. No mass or hydronephrosis visualized.  Bladder:  Appears normal for degree of bladder distention. Both ureteral jets are visualized.  IMPRESSION: 1. No evidence of hydronephrosis. 2. Echogenic renal parenchyma bilaterally suggests underlying medical renal disease.   Electronically Signed   By: Malachy Moan M.D.   On: 06/12/2015 10:16   Ct Aspiration  06/13/2015   CLINICAL DATA:  History of left psoas abscess, post catheter drainage. Septic left sacroiliac joint arthritis with myositis and phlegmon in the left iliopsoas region, and a small  central 2 cm fluid component on recent MR.  EXAM: CT GUIDED ASPIRATION BIOPSY OF LEFT ILIOPSOAS ABSCESS  ANESTHESIA/SEDATION: Intravenous Fentanyl and Versed were administered as conscious sedation during continuous cardiorespiratory monitoring by the radiology RN, with a total moderate sedation time of 14 minutes.  PROCEDURE: The procedure risks, benefits, and alternatives were explained to the patient. Questions regarding the procedure were encouraged and answered. The patient understands and consents to the procedure.  Select axial scans through the pelvis were obtained. The fluid collection seen on MR was localized and an appropriate skin entry site was determined and marked.  The operative field was prepped with Betadinein a sterile fashion, and a sterile drape was applied covering the operative field. A sterile gown and sterile gloves were used for the procedure. Local anesthesia was provided with 1% Lidocaine.  Under CT fluoroscopic guidance, an 18 gauge trocar needle was advanced into the collection. Approximately 5 mL of cloudy yellow fluid were aspirated, sent for Gram stain, culture and sensitivity. Postprocedure scans show no hemorrhage or other apparent complication. The patient tolerated the procedure well.  COMPLICATIONS: None immediate  FINDINGS: Extensive phlegmonous  change in the left iliopsoas region. The fluid collection seen on MR is identified, and 5 mL thin cloudy fluid aspirated under CT guidance. Sample sent for Gram stain, culture and sensitivity.  IMPRESSION: 1. Technically successful aspiration of small left iliopsoas abscess under CT guidance.   Electronically Signed   By: Corlis Leak M.D.   On: 06/13/2015 11:03    Scheduled Meds: . sodium chloride   Intravenous STAT  . anidulafungin  100 mg Intravenous Q24H  . DAPTOmycin (CUBICIN)  IV  6 mg/kg Intravenous Q24H  . docusate sodium  100 mg Oral BID  . gabapentin  300 mg Oral TID  . polyethylene glycol  17 g Oral BID  . senna  1 tablet Oral QHS  . sodium chloride  3 mL Intravenous Q12H  . sodium chloride  3 mL Intravenous Q12H   Continuous Infusions: . sodium chloride 1,000 mL (06/13/15 1234)    Active Problems:   Iliopsoas abscess   MRSA bacteremia   Acute kidney injury (nontraumatic)   Fever   Anemia   Acute renal failure   Hypokalemia   H/O back injury   Intractable pain   Hematuria    Time spent: 30 minutes    [facility] M  [facility] Pager [phone]. If 7PM-7AM, please contact night-coverage at [email or web address], password [facility] 06/13/2015, 12:54 PM  LOS: 2 days

## 2015-06-13 NOTE — Progress Notes (Signed)
*  PRELIMINARY RESULTS* Echocardiogram Echocardiogram Transesophageal has been performed.  Jeryl Columbia 06/13/2015, 3:16 PM

## 2015-06-13 NOTE — Consult Note (Signed)
Reason for Consult: Acute kidney injury Referring Physician: Dr. [name] is an 39 y.o. male.  HPI: He is a patient who has no significant past medical history admitted recently because of iliopsoas abscess treated with antibiotics and placement of drainage tube. After the drainage she was removed patient came back to the emergency room with complaints of fever, worsening of back pain, and hematuria. Presently consult is called because of acute kidney injury. Patient has a previous history of renal failure, a history of kidney stone. Patient also denies any upper respiratory tract infection. Presently has some nausea mainly complains of back pain and leg weakness.  Past Medical History  Diagnosis Date  . H/O back injury   . MRSA (methicillin resistant Staphylococcus aureus)   . 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 MRSA bacteremia 04/2015    Past Surgical History  Procedure Laterality Date  . Skin biopsy    . Abdmoninal drain insertion      Family History  Problem Relation Age of Onset  . Cancer Father     Social History:  reports that he has been smoking Cigarettes.  He has a 10 pack-year smoking history. He has never used smokeless tobacco. He reports that he does not drink alcohol or use illicit drugs.  Allergies:  Allergies  Allergen Reactions  . Vicodin [Hydrocodone-Acetaminophen] Hives and Itching  . Adhesive [Tape] Rash    Medications: I have reviewed the patient'Walker current medications.  Results for orders placed or performed during the hospital encounter of 06/11/15 (from the past 48 hour(Walker))  Basic metabolic panel     Status: Abnormal   Collection Time: 06/11/15  3:01 PM  Result Value Ref Range   Sodium 143 135 - 145 mmol/L   Potassium 3.9 3.5 - 5.1 mmol/L    Comment: DELTA CHECK NOTED   Chloride 110 101 - 111 mmol/L   CO2 26 22 - 32 mmol/L   Glucose, Bld 96 65 - 99 mg/dL   BUN 11 6 - 20 mg/dL   Creatinine, Ser 1.70 (H) 0.61 - 1.24 mg/dL   Calcium  8.1 (L) 8.9 - 10.3 mg/dL   GFR calc non Af Amer 50 (L) >60 mL/min   GFR calc Af Amer 58 (L) >60 mL/min    Comment: (NOTE) The eGFR has been calculated using the CKD EPI equation. This calculation has not been validated in all clinical situations. eGFR'Walker persistently <60 mL/min signify possible Chronic Kidney Disease.    Anion gap 7 5 - 15  Vitamin B12     Status: Abnormal   Collection Time: 06/11/15  3:01 PM  Result Value Ref Range   Vitamin B-12 143 (L) 180 - 914 pg/mL    Comment: (NOTE) This assay is not validated for testing neonatal or myeloproliferative syndrome specimens for Vitamin B12 levels. Performed at [facility]   Folate     Status: None   Collection Time: 06/11/15  3:01 PM  Result Value Ref Range   Folate 23.0 >5.9 ng/mL    Comment: Performed at [facility]  Iron and TIBC     Status: Abnormal   Collection Time: 06/11/15  3:01 PM  Result Value Ref Range   Iron 8 (L) 45 - 182 ug/dL   TIBC 220 (L) 250 - 450 ug/dL   Saturation Ratios 4 (L) 17.9 - 39.5 %   UIBC 212 ug/dL    Comment: Performed at [facility]  Ferritin  Status: None   Collection Time: 06/11/15  3:01 PM  Result Value Ref Range   Ferritin 138 24 - 336 ng/mL    Comment: Performed at [place]     Status: Abnormal   Collection Time: 06/12/15  7:07 AM  Result Value Ref Range   Prothrombin Time 15.3 (H) 11.6 - 15.2 seconds   INR 1.19 0.00 - 1.49  Comprehensive metabolic panel     Status: Abnormal   Collection Time: 06/12/15  7:08 AM  Result Value Ref Range   Sodium 142 135 - 145 mmol/L   Potassium 3.6 3.5 - 5.1 mmol/L   Chloride 109 101 - 111 mmol/L   CO2 27 22 - 32 mmol/L   Glucose, Bld 110 (H) 65 - 99 mg/dL   BUN 14 6 - 20 mg/dL   Creatinine, Ser 1.66 (H) 0.61 - 1.24 mg/dL   Calcium 8.1 (L) 8.9 - 10.3 mg/dL   Total Protein 6.2 (L) 6.5 - 8.1 g/dL   Albumin 2.4 (L) 3.5 - 5.0 g/dL   AST 22 15 - 41 U/L   ALT 17 17 - 63 U/L   Alkaline Phosphatase  242 (H) 38 - 126 U/L   Total Bilirubin 0.3 0.3 - 1.2 mg/dL   GFR calc non Af Amer 52 (L) >60 mL/min   GFR calc Af Amer 60 (L) >60 mL/min    Comment: (NOTE) The eGFR has been calculated using the CKD EPI equation. This calculation has not been validated in all clinical situations. eGFR'Walker persistently <60 mL/min signify possible Chronic Kidney Disease.    Anion gap 6 5 - 15  CBC     Status: Abnormal   Collection Time: 06/12/15  7:08 AM  Result Value Ref Range   WBC 6.6 4.0 - 10.5 K/uL   RBC 2.80 (L) 4.22 - 5.81 MIL/uL   Hemoglobin 7.6 (L) 13.0 - 17.0 g/dL   HCT 23.3 (L) 39.0 - 52.0 %   MCV 83.2 78.0 - 100.0 fL   MCH 27.1 26.0 - 34.0 pg   MCHC 32.6 30.0 - 36.0 g/dL   RDW 14.0 11.5 - 15.5 %   Platelets 165 150 - 400 K/uL  Basic metabolic panel     Status: Abnormal   Collection Time: 06/13/15  6:23 AM  Result Value Ref Range   Sodium 142 135 - 145 mmol/L   Potassium 3.5 3.5 - 5.1 mmol/L   Chloride 108 101 - 111 mmol/L   CO2 26 22 - 32 mmol/L   Glucose, Bld 106 (H) 65 - 99 mg/dL   BUN 15 6 - 20 mg/dL   Creatinine, Ser 1.77 (H) 0.61 - 1.24 mg/dL   Calcium 8.3 (L) 8.9 - 10.3 mg/dL   GFR calc non Af Amer 48 (L) >60 mL/min   GFR calc Af Amer 55 (L) >60 mL/min    Comment: (NOTE) The eGFR has been calculated using the CKD EPI equation. This calculation has not been validated in all clinical situations. eGFR'Walker persistently <60 mL/min signify possible Chronic Kidney Disease.    Anion gap 8 5 - 15  CBC     Status: Abnormal   Collection Time: 06/13/15  6:23 AM  Result Value Ref Range   WBC 9.1 4.0 - 10.5 K/uL   RBC 3.04 (L) 4.22 - 5.81 MIL/uL   Hemoglobin 8.0 (L) 13.0 - 17.0 g/dL   HCT 25.2 (L) 39.0 - 52.0 %   MCV 82.9 78.0 - 100.0 fL   MCH  26.3 26.0 - 34.0 pg   MCHC 31.7 30.0 - 36.0 g/dL   RDW 13.9 11.5 - 15.5 %   Platelets 262 150 - 400 K/uL    Mr Lumbar Spine W Wo Contrast  06/11/2015   CLINICAL DATA:  Fever. Continued low back pain. History of abscess drainage.  EXAM:  MRI LUMBAR SPINE WITHOUT AND WITH CONTRAST  TECHNIQUE: Multiplanar and multiecho pulse sequences of the lumbar spine were obtained without and with intravenous contrast.  CONTRAST:  26mL MULTIHANCE GADOBENATE DIMEGLUMINE 529 MG/ML IV SOLN  COMPARISON:  06/02/2015 lumbar spine MRI. 06/09/2015 CT abdomen and pelvis.  FINDINGS: Vertebral alignment is unchanged with slight retrolisthesis of L5 on S1. Vertebral body heights are preserved. Disc desiccation and mild disc space narrowing are present at L4-5 and L5-S1, similar to prior. No vertebral marrow edema is seen in the lumbar spine. Conus medullaris is normal in signal and terminates at L1.  T11-12: Only imaged sagittally. Mild disc bulging results in at most mild spinal canal and neural foraminal narrowing, unchanged.  T12-L1:  Only imaged sagittally.  No disc herniation or stenosis.  L1-2 through L3-4:  Negative.  L4-5: Mild circumferential disc bulging, small superimposed central disc protrusion, and mild facet hypertrophy result in minimal spinal canal narrowing and minimal bilateral neural foraminal narrowing, unchanged. Minimal residual dorsal epidural enhancement at the site of prior phlegmon has decreased from prior.  L5-S1: Mild circumferential disc bulging, broad central disc protrusion, and mild facet hypertrophy result in mild right greater than left lateral recess stenosis and mild bilateral neural foraminal stenosis, unchanged. No spinal stenosis.  Marrow edema about the left SI joint involving the left sacral ala and iliac bone does not appear significantly different than on the prior MRI. Erosive changes and fluid are again noted in the left SI joint. Edema is again seen in the soft tissues surrounding the left SI joint including the iliopsoas and gluteal musculature, lower lumbar paraspinal musculature, and other surrounding soft tissues.  Fluid collection at the superior aspect of the left iliacus muscle has enlarged from the prior MRI, now  measuring 2.1 x 1.9 cm (previously 1.4 x 0.8 cm). Fluid tracks inferiorly from this collection along the anterior aspect of the left SI joint, and there is a new 1.2 cm fluid collection just superior to the left pelvic sidewall posterior to the left external iliac vein (series 7, image 49). Extensive surrounding phlegmon is again seen about the left iliopsoas musculature. Trace free fluid is noted in the right mid abdomen near the tip of the liver, grossly stable to minimally increased compared to that on prior CT.  IMPRESSION: 1. Persistent extensive marrow and soft tissue edema about the left SI joint consistent with septic arthritis and surrounding myositis and soft tissue phlegmon. 2. 2.1 cm fluid collection/abscess at the superior aspect of the left iliacus, larger than on prior MRI. 3. New 1.2 cm collection just superior to the left pelvic sidewall. 4. No evidence of new infection in the lumbar spine. Minimal residual dorsal epidural enhancement at L4-5.   Electronically Signed   By: Logan Bores   On: 06/11/2015 14:01   US Renal  06/12/2015   CLINICAL DATA:  39 year old male with hematuria and acute renal failure  EXAM: RENAL / URINARY TRACT ULTRASOUND COMPLETE  COMPARISON:  CT scan abdomen/pelvis 06/09/2015 ; MRI lumbar spine 06/11/2015  FINDINGS: Right Kidney:  Length: 14.5 cm. Echogenic renal parenchyma with increased conspicuity of the corticomedullary junctions. No mass or hydronephrosis visualized.  Left Kidney:  Length: 14.2 cm. Echogenic renal parenchyma with increased conspicuity of the corticomedullary junctions. No mass or hydronephrosis visualized.  Bladder:  Appears normal for degree of bladder distention. Both ureteral jets are visualized.  IMPRESSION: 1. No evidence of hydronephrosis. 2. Echogenic renal parenchyma bilaterally suggests underlying medical renal disease.   Electronically Signed   By: Jacqulynn Cadet M.D.   On: 06/12/2015 10:16    Review of Systems  Constitutional:  Positive for fever.  Respiratory: Negative for cough, hemoptysis and shortness of breath.   Cardiovascular: Negative for chest pain and leg swelling.  Gastrointestinal: Positive for nausea. Negative for vomiting.  Genitourinary: Negative for dysuria and urgency.  Musculoskeletal: Positive for back pain and joint pain.  Neurological: Positive for weakness.   Blood pressure 140/91, pulse 85, temperature 98.1 F (36.7 C), temperature source Oral, resp. rate 18, height 6' (1.829 m), weight 160 lb (72.576 kg), SpO2 100 %. Physical Exam  Constitutional: He is oriented to person, place, and time. No distress.  Eyes: No scleral icterus.  Neck: No JVD present.  Cardiovascular: Normal rate.   No murmur heard. Respiratory: He has no wheezes. He has no rales.  GI: He exhibits no distension. There is no tenderness.  Musculoskeletal: He exhibits no edema.  Neurological: He is alert and oriented to person, place, and time.    Assessment/Plan: Problem #1 acute kidney injury: His creatinine was about 1.141 on 06/05/2015, increased to 1.20 on 17/18/2016 and then to 1.51 on 06/11/2015. Patient had a workup including CAT scan with contrast on 06/09/2015. Hence the etiology could be secondary to contrast-induced renal failure/prerenal syndrome/ATN/vancomycin-induced acute kidney injury. Presently patient is nonoliguric and his creatinine is increasing. Problem #2 hematuria: At this moment etiology is not clear. Patient has an ultrasound showed right kidney to be 14.5 and left kidney 14.2. No history of kidney stone. No upper respiratory tract infection. IGA?  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 Problem #4 anemia: Possibly iron deficiency anemia Problem #5 MRSA bacteremia Plan: Increase IV fluid to 125 mL per hour 2] we'll check ANA, complement, hepatitis B surface antigen, hepatitis C antibody, ANCA and ASO titer. 3] we'll repeat UA 4] if his renal function continue to decline  possibly we may need to consider changing his vancomycin. 5] we'll check his basic metabolic panel in the morning.  [name] 06/13/2015, 8:25 AM

## 2015-06-13 NOTE — Progress Notes (Signed)
Electrical Cardioversion Procedure Note [name] [id number] [birth year]  Procedure: Indications: TEE  Procedure Details Consent: transesophageal echocardiogram Time Out: Verified patient identification, verified procedure, site/side was marked, verified correct patient position, special equipment/implants available, medications/allergies/relevent history reviewed, required imaging and test results available. Yes Patient placed on cardiac monitor, pulse oximetry, supplemental oxygen as necessary.  Sedation given: given by anesthesia Pacer pads placed : no pads needed   Evaluation Findings: Complications: none     Patient did tolerate procedure well.   Marinda Elk 06/13/2015, 2:09 PM

## 2015-06-13 NOTE — Anesthesia Procedure Notes (Signed)
Procedure Name: MAC Date/Time: 06/13/2015 1:55 PM Performed by: Pernell Dupre, Quentyn Kolbeck A Pre-anesthesia Checklist: Patient identified, Emergency Drugs available, Suction available, Patient being monitored and Timeout performed Patient Re-evaluated:Patient Re-evaluated prior to inductionOxygen Delivery Method: Simple face mask

## 2015-06-13 NOTE — Care Management Note (Signed)
Case Management Note  Patient Details  Name: [name] MRN: [id number] Date of Birth: 04/17/1976  Subjective/Objective:                  Pt admitted from home with AKI and psoas abcess. Pt lives with family and will return home at discharge. Pt is active with [facility] RN for IV AB.   Action/Plan: Will continue to follow for discharge planning needs. Anticipate pt will discharge home with IV AB and HH RN.   Expected Discharge Date:    06/16/15              Expected Discharge Plan:  Home w Home Health Services  In-House Referral:  NA  Discharge planning Services  CM Consult  Post Acute Care Choice:  Resumption of Svcs/PTA Provider Choice offered to:     DME Arranged:    DME Agency:     HH Arranged:    HH Agency:     Status of Service:  In process, will continue to follow  Medicare Important Message Given:    Date Medicare IM Given:    Medicare IM give by:    Date Additional Medicare IM Given:    Additional Medicare Important Message give by:     If discussed at Long Length of Stay Meetings, dates discussed:    Additional Comments:  Cheryl Flash, RN 06/13/2015, 1:23 PM

## 2015-06-13 NOTE — Anesthesia Preprocedure Evaluation (Signed)
Anesthesia Evaluation  Patient identified by MRN, date of birth, ID band Patient awake    Reviewed: Allergy & Precautions, H&P , NPO status , Patient's Chart, lab work & pertinent test results  Airway Mallampati: I  TM Distance: >3 FB     Dental  (+) Teeth Intact   Pulmonary Current Smoker,  breath sounds clear to auscultation        Cardiovascular Rhythm:regular Rate:Normal     Neuro/Psych Migraines; chronic back pain    GI/Hepatic   Endo/Other    Renal/GU ARFRenal disease     Musculoskeletal   Abdominal   Peds  Hematology  (+) anemia ,   Anesthesia Other Findings   Reproductive/Obstetrics                             Anesthesia Physical Anesthesia Plan  ASA: II  Anesthesia Plan:    Post-op Pain Management:    Induction:   Airway Management Planned:   Additional Equipment:   Intra-op Plan:   Post-operative Plan:   Informed Consent:   Dental Advisory Given  Plan Discussed with: Surgeon  Anesthesia Plan Comments:         Anesthesia Quick Evaluation

## 2015-06-13 NOTE — Interval H&P Note (Signed)
History and Physical Interval Note:  06/13/2015 11:52 AM  [name]  has presented today for surgery, with the diagnosis of septic bacteremia, eval.for vegetation  The various methods of treatment have been discussed with the patient and family. After consideration of risks, benefits and other options for treatment, the patient has consented to  Procedure(s): TRANSESOPHAGEAL ECHOCARDIOGRAM (TEE) (N/A) as a surgical intervention .  The patient's history has been reviewed, patient examined, no change in status, stable for surgery.  I have reviewed the patient's chart and labs.  Questions were answered to the patient's satisfaction.     Charlton Haws

## 2015-06-13 NOTE — Progress Notes (Signed)
ANTIBIOTIC CONSULT NOTE  Pharmacy Consult for Cubicin Indication: Bacteremia  Allergies  Allergen Reactions  . Vicodin [Hydrocodone-Acetaminophen] Hives and Itching  . Adhesive [Tape] Rash   Patient Measurements: Height: 6' (182.9 cm) Weight: 160 lb (72.576 kg) IBW/kg (Calculated) : 77.6  Vital Signs:    Labs:  Recent Labs  06/11/15 0345 06/11/15 1501 06/12/15 0708 06/13/15 0623  WBC 11.4*  --  6.6 9.1  HGB 8.4*  --  7.6* 8.0*  PLT 243  --  165 262  CREATININE 1.51* 1.70* 1.66* 1.77*   Estimated Creatinine Clearance: 59.2 mL/min (by C-G formula based on Cr of 1.77).   Recent Labs  06/13/15 0900  VANCOTROUGH 38*     Microbiology: Recent Results (from the past 720 hour(s))  Culture, blood (routine x 2)     Status: None   Collection Time: 05/15/15  4:00 PM  Result Value Ref Range Status   Specimen Description BLOOD LEFT FOREARM DRAWN BY RN  Final   Special Requests   Final    BOTTLES DRAWN AEROBIC AND ANAEROBIC AEB=6CC ANA=4CC   Culture NO GROWTH 5 DAYS  Final   Report Status 05/20/2015 FINAL  Final  Urine culture     Status: None   Collection Time: 05/15/15  4:13 PM  Result Value Ref Range Status   Specimen Description URINE, CLEAN CATCH  Final   Special Requests NONE  Final   Culture   Final    >=100,000 COLONIES/mL METHICILLIN RESISTANT STAPHYLOCOCCUS AUREUS Performed at [facility]    Report Status 05/19/2015 FINAL  Final   Organism ID, Bacteria METHICILLIN RESISTANT STAPHYLOCOCCUS AUREUS  Final      Susceptibility   Methicillin resistant staphylococcus aureus - MIC*    CIPROFLOXACIN >=8 RESISTANT Resistant     GENTAMICIN <=0.5 SENSITIVE Sensitive     NITROFURANTOIN <=16 SENSITIVE Sensitive     OXACILLIN >=4 RESISTANT Resistant     TETRACYCLINE <=1 SENSITIVE Sensitive     VANCOMYCIN 1 SENSITIVE Sensitive     TRIMETH/SULFA <=10 SENSITIVE Sensitive     CLINDAMYCIN <=0.25 SENSITIVE Sensitive     RIFAMPIN <=0.5 SENSITIVE Sensitive    Inducible Clindamycin NEGATIVE Sensitive     * >=100,000 COLONIES/mL METHICILLIN RESISTANT STAPHYLOCOCCUS AUREUS  Culture, blood (routine x 2)     Status: None   Collection Time: 05/15/15  4:25 PM  Result Value Ref Range Status   Specimen Description BLOOD RIGHT WRIST  Final   Special Requests BOTTLES DRAWN AEROBIC AND ANAEROBIC 8CC EACH  Final   Culture  Setup Time   Final    GRAM POSITIVE COCCI IN CLUSTERS IN BOTH AEROBIC AND ANAEROBIC BOTTLES CRITICAL RESULT CALLED TO, READ BACK BY AND VERIFIED WITH: HUDSON K. AT [facility] AT 1043A ON [id number] BY THOMPSON S.    Culture   Final    METHICILLIN RESISTANT STAPHYLOCOCCUS AUREUS Performed at [facility]    Report Status 05/18/2015 FINAL  Final   Organism ID, Bacteria METHICILLIN RESISTANT STAPHYLOCOCCUS AUREUS  Final      Susceptibility   Methicillin resistant staphylococcus aureus - MIC*    CIPROFLOXACIN >=8 RESISTANT Resistant     ERYTHROMYCIN >=8 RESISTANT Resistant     GENTAMICIN <=0.5 SENSITIVE Sensitive     OXACILLIN >=4 RESISTANT Resistant     TETRACYCLINE <=1 SENSITIVE Sensitive     VANCOMYCIN 1 SENSITIVE Sensitive     TRIMETH/SULFA <=10 SENSITIVE Sensitive     CLINDAMYCIN <=0.25 SENSITIVE Sensitive     RIFAMPIN <=0.5 SENSITIVE Sensitive  Inducible Clindamycin NEGATIVE Sensitive     * METHICILLIN RESISTANT STAPHYLOCOCCUS AUREUS  MRSA PCR Screening     Status: Abnormal   Collection Time: 05/16/15  9:08 AM  Result Value Ref Range Status   MRSA by PCR POSITIVE (A) NEGATIVE Final    Comment: RESULT CALLED TO, READ BACK BY AND VERIFIED WITH: C. HUDSON RN 10:50 05/16/15 ([email or web address])        The GeneXpert MRSA Assay (FDA approved for NASAL specimens only), is one component of a comprehensive MRSA colonization surveillance program. It is not intended to diagnose MRSA infection nor to guide or monitor treatment for MRSA infections.   Culture, routine-abscess     Status: None   Collection Time: 05/16/15  3:47 PM  Result  Value Ref Range Status   Specimen Description ABSCESS  Final   Special Requests   Final    CRANIAL ASPECT OF THE LEFT ILIOPSOAS ABSCESS SYRINGE 1   Gram Stain   Final    ABUNDANT WBC PRESENT,BOTH PMN AND MONONUCLEAR NO SQUAMOUS EPITHELIAL CELLS SEEN MODERATE GRAM POSITIVE COCCI IN PAIRS IN CLUSTERS Performed at [facility]    Culture   Final    ABUNDANT METHICILLIN RESISTANT STAPHYLOCOCCUS AUREUS Note: RIFAMPIN AND GENTAMICIN SHOULD NOT BE USED AS SINGLE DRUGS FOR TREATMENT OF STAPH INFECTIONS. This organism is presumed to be Clindamycin resistant based on detection of inducible Clindamycin resistance. CRITICAL RESULT CALLED TO, READ BACK BY AND  VERIFIED WITH: TRANACE D@ 0856 ON [id number] BY [facility] Performed at [facility]    Report Status 05/19/2015 FINAL  Final   Organism ID, Bacteria METHICILLIN RESISTANT STAPHYLOCOCCUS AUREUS  Final      Susceptibility   Methicillin resistant staphylococcus aureus - MIC*    CLINDAMYCIN <=0.25 SENSITIVE Sensitive     ERYTHROMYCIN >=8 RESISTANT Resistant     GENTAMICIN <=0.5 SENSITIVE Sensitive     LEVOFLOXACIN 4 INTERMEDIATE Intermediate     OXACILLIN >=4 RESISTANT Resistant     PENICILLIN >=0.5 RESISTANT Resistant     RIFAMPIN <=0.5 SENSITIVE Sensitive     TRIMETH/SULFA <=10 SENSITIVE Sensitive     VANCOMYCIN 1 SENSITIVE Sensitive     TETRACYCLINE 2 SENSITIVE Sensitive     * ABUNDANT METHICILLIN RESISTANT STAPHYLOCOCCUS AUREUS  Culture, blood (routine x 2)     Status: None   Collection Time: 05/18/15 12:30 PM  Result Value Ref Range Status   Specimen Description BLOOD LEFT HAND  Final   Special Requests BOTTLES DRAWN AEROBIC AND ANAEROBIC 5CC  Final   Culture NO GROWTH 5 DAYS  Final   Report Status 05/23/2015 FINAL  Final  Culture, blood (routine x 2)     Status: None   Collection Time: 05/18/15 12:40 PM  Result Value Ref Range Status   Specimen Description BLOOD LEFT HAND  Final   Special Requests BOTTLES DRAWN AEROBIC  AND ANAEROBIC 5CC  Final   Culture NO GROWTH 5 DAYS  Final   Report Status 05/23/2015 FINAL  Final  Blood culture (routine x 2)     Status: None   Collection Time: 06/02/15  4:20 PM  Result Value Ref Range Status   Specimen Description BLOOD RIGHT ANTECUBITAL  Final   Special Requests BOTTLES DRAWN AEROBIC AND ANAEROBIC 6CC  Final   Culture NO GROWTH 7 DAYS  Final   Report Status 06/09/2015 FINAL  Final  Blood culture (routine x 2)     Status: None   Collection Time: 06/02/15  4:27 PM  Result Value  Ref Range Status   Specimen Description BLOOD LEFT HAND  Final   Special Requests BOTTLES DRAWN AEROBIC AND ANAEROBIC 6CC  Final   Culture NO GROWTH 7 DAYS  Final   Report Status 06/09/2015 FINAL  Final  Urine culture     Status: None   Collection Time: 06/09/15  7:47 PM  Result Value Ref Range Status   Specimen Description URINE, CLEAN CATCH  Final   Special Requests NONE  Final   Culture NO GROWTH 1 DAY  Final   Report Status 06/11/2015 FINAL  Final  Culture, blood (routine x 2)     Status: None (Preliminary result)   Collection Time: 06/11/15  3:45 AM  Result Value Ref Range Status   Specimen Description BLOOD LEFT ANTECUBITAL DRAWN BY RN  Final   Special Requests   Final    BOTTLES DRAWN AEROBIC AND ANAEROBIC AEB=8CC ANA=10CC   Culture  Setup Time   Final    YEAST RECOVERED FROM THE AEROBIC BOTTLE Gram Stain Report Called to,Read Back By and Verified With: BULLINS,M. AT 0945 ON 06/12/2015 BY RESSEGGER,R. Performed at [facility]    Culture NO GROWTH 1 DAY  Final   Report Status PENDING  Incomplete  Culture, blood (routine x 2)     Status: None (Preliminary result)   Collection Time: 06/11/15  3:55 AM  Result Value Ref Range Status   Specimen Description BLOOD PICC LINE DRAWN BY RN TT  Final   Special Requests BOTTLES DRAWN AEROBIC ONLY 8CC  Final   Culture  Setup Time   Final    YEAST RECOVERED FORM THE AEROBIC BOTTLE Gram Stain Report Called to,Read Back By and  Verified With: BULLINS,M. AT 0945 ON 06/12/2015 BY RESSEGGER,R.    Culture YEAST Performed at [facility]   Final   Report Status PENDING  Incomplete  Culture, routine-abscess     Status: None (Preliminary result)   Collection Time: 06/12/15  4:10 PM  Result Value Ref Range Status   Specimen Description ABSCESS  Final   Special Requests PSOAS LEFT MUSCLE  Final   Gram Stain   Final    MODERATE WBC PRESENT,BOTH PMN AND MONONUCLEAR NO SQUAMOUS EPITHELIAL CELLS SEEN NO ORGANISMS SEEN Performed at [facility]    Culture NO GROWTH Performed at [facility]   Final   Report Status PENDING  Incomplete   Medical History: Past Medical History  Diagnosis Date  . H/O back injury   . MRSA (methicillin resistant Staphylococcus aureus)   . 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 MRSA bacteremia 04/2015   Medications:  Scheduled:  . sodium chloride   Intravenous STAT  . anidulafungin  100 mg Intravenous Q24H  . DAPTOmycin (CUBICIN)  IV  6 mg/kg Intravenous Q24H  . docusate sodium  100 mg Oral BID  . gabapentin  300 mg Oral TID  . polyethylene glycol  17 g Oral BID  . senna  1 tablet Oral QHS  . sodium chloride  3 mL Intravenous Q12H  . sodium chloride  3 mL Intravenous Q12H   Assessment: 39 yo male admitted for workup due to fever and CXR showing atelectasis vs pneumonia. Hx MRSA, poor renal function (Vanc stopped).  Also on anidulafungin for (+) yeast in recent BCx.   Goal of Therapy:  Eradicate infection.   Plan:  Daptomycin 6mg /kg IV every 24 hours. Monitor labs, micro and vitals including CK results.   Mady Gemma, [facility] 06/13/2015,11:49  AM

## 2015-06-13 NOTE — Transfer of Care (Signed)
Immediate Anesthesia Transfer of Care Note  Patient: [name]  Procedure(s) Performed: Procedure(s): TRANSESOPHAGEAL ECHOCARDIOGRAM (TEE) (N/A)  Patient Location: PACU  Anesthesia Type:MAC  Level of Consciousness: awake, alert , oriented and patient cooperative  Airway & Oxygen Therapy: Patient Spontanous Breathing and Patient connected to face mask oxygen  Post-op Assessment: Report given to RN and Post -op Vital signs reviewed and stable  Post vital signs: Reviewed and stable  Last Vitals:  Filed Vitals:   06/13/15 1333  BP: 166/107  Pulse: 69  Temp: 36.6 C  Resp: 18    Complications: No apparent anesthesia complications

## 2015-06-13 NOTE — CV Procedure (Signed)
TEE: Propofol given by CRNA along with fentanyl and versed  Normal EF  60% Normal valves trivial MR No SBE Normal RV No Effusion No LAA thrombus No ASD/PFO No aortic debris  Charlton Haws

## 2015-06-14 LAB — BASIC METABOLIC PANEL
ANION GAP: 9 (ref 5–15)
BUN: 12 mg/dL (ref 6–20)
CALCIUM: 8.5 mg/dL — AB (ref 8.9–10.3)
CO2: 26 mmol/L (ref 22–32)
Chloride: 105 mmol/L (ref 101–111)
Creatinine, Ser: 1.64 mg/dL — ABNORMAL HIGH (ref 0.61–1.24)
GFR calc Af Amer: >60 mL/min (ref >60)
GFR, EST NON AFRICAN AMERICAN: 52 mL/min — AB (ref >60)
GLUCOSE: 142 mg/dL — AB (ref 65–99)
Potassium: 3.3 mmol/L — ABNORMAL LOW (ref 3.5–5.1)
Sodium: 140 mmol/L (ref 135–145)

## 2015-06-14 LAB — HEPATITIS C ANTIBODY: HCV Ab: <0.1 s/co ratio (ref 0.0–0.9)

## 2015-06-14 LAB — C3 COMPLEMENT: C3 COMPLEMENT: 132 mg/dL (ref 82–167)

## 2015-06-14 LAB — C4 COMPLEMENT: Complement C4, Body Fluid: 37 mg/dL (ref 14–44)

## 2015-06-14 LAB — CK: Total CK: 18 U/L — ABNORMAL LOW (ref 49–397)

## 2015-06-14 LAB — ANTISTREPTOLYSIN O TITER: ASO: 67.8 IU/mL (ref 0.0–200.0)

## 2015-06-14 LAB — HEPATITIS B SURFACE ANTIGEN: Hepatitis B Surface Ag: NEGATIVE

## 2015-06-14 MED ORDER — FUROSEMIDE 10 MG/ML IJ SOLN
40.0000 mg | Freq: Two times a day (BID) | INTRAMUSCULAR | Status: DC
  Administered 2015-06-14 – 2015-06-17 (×7): 40 mg via INTRAVENOUS
  Filled 2015-06-14 (×7): qty 4

## 2015-06-14 MED ORDER — NICOTINE 21 MG/24HR TD PT24
21.0000 mg | MEDICATED_PATCH | Freq: Every day | TRANSDERMAL | Status: DC
  Administered 2015-06-14 – 2015-06-18 (×4): 21 mg via TRANSDERMAL
  Filled 2015-06-14 (×6): qty 1

## 2015-06-14 NOTE — Progress Notes (Addendum)
Subjective: Pain is getting better and no nausea or vomiting. His appetite is good.Still he has some pain on the back and left side  Objective: Vital signs in last 24 hours: Temp:  [97.9 F (36.6 C)-98.9 F (37.2 C)] 98.1 F (36.7 C) (07/23 0625) Pulse Rate:  [66-89] 79 (07/23 0625) Resp:  [16-18] 18 (07/23 0625) BP: (149-166)/(88-109) 158/88 mmHg (07/23 0625) SpO2:  [96 %-100 %] 97 % (07/23 0625) Weight:  [160 lb (72.576 kg)] 160 lb (72.576 kg) (07/22 1333)  Intake/Output from previous day: 07/22 0701 - 07/23 0700 In: 640 [P.O.:240; I.V.:400] Out: 1000 [Urine:1000] Intake/Output this shift:     Recent Labs  06/12/15 0708 06/13/15 0623  HGB 7.6* 8.0*    Recent Labs  06/12/15 0708 06/13/15 0623  WBC 6.6 9.1  RBC 2.80* 3.04*  HCT 23.3* 25.2*  PLT 165 262    Recent Labs  06/12/15 0708 06/13/15 0623  NA 142 142  K 3.6 3.5  CL 109 108  CO2 27 26  BUN 14 15  CREATININE 1.66* 1.77*  GLUCOSE 110* 106*  CALCIUM 8.1* 8.3*    Recent Labs  06/12/15 0707  INR 1.19     generally patient is alert and in no apparent distress. Chest is clear to auscultation His heart exam reveals regular rate and rhythm no murmur Extremities no edema   Assessment/Plan: Problem #1 acute kidney injury: Patient is nonoliguric. Presently his BUN and creatinine is stablizing. Etiology was thought to be secondary to ATN/contrast-induced acute kidney injury/vancomycin-induced acute injury. Problem #2 anemia: His hemoglobin is stable. Ferritin is normal but his iron saturation is low , possibly iron deficiency anemia. Problem #3 iliopsoas abscess: Presently is on antibiotics. Patient is afebrile and his white blood cell count is normal. Problem #4 hematuria: Presently seems to be persistent. ASO titer, hepatitis B is negative normal complement. Possible IgA nephropathy/Alport syndrome/if urological workup with negative Problem #5 MRSA bacteremia Plan: We'll continue his present  management 2] will start patient on Lasix 40 mg IV twice a day 3] we'll check his basic metabolic panel in the morning.   Airam Runions S 06/14/2015, 9:48 AM

## 2015-06-14 NOTE — Progress Notes (Signed)
Attempted to get pt weight but pt states "I don't want to do it right now." Will continue to monitor

## 2015-06-14 NOTE — Progress Notes (Signed)
PROGRESS NOTE  [name] WMG:[id number] DOB: 09-25-1976 DOA: 06/11/2015 PCP: Doris Cheadle, MD  Summary: 39 year old man who reports no significant past medical history who was hospitalized in June/July for multiple paraspinal abscesses, iliopsoas abscess on the left. He was seen by infectious disease in [place] and placed on IV antibiotics for MRSA bacteremia as well as abscesses. He underwent drain placement was discharged home. Subsequently drains were removed. He presented to ED 7/20 with increased flank pain and fever. He was was admitted for multiple paraspinal abscesses, AKI, hematuria, anemia.  Assessment/Plan: 1. Candidemia 2/2 bottles. PICC line removed. Afebrile. On Eraxis per ID. TEE negative.  2. AKI. Improved. Suspect vancomycin induced. Appreciate nephrology. 3. Multiple paraspinal abscesses (MRSA), psoas abscess, septic arthritis SI joint. S/p aspiration (drain not recommended by IR) 7/21. Off vanc secondary to AKI. Now on daptomycin per ID Dr. Ninetta Lights. 4. Hematuria. Etiology unclear. Present 05/15/2015 prior to vanc. Hgb stable. Outpatient f/u with urology planned for cystoscopy. 5. Normocytic anemia. Suspect subacute disease and hematuria. 6. 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  Continue Eraxis and daptomycin.   Repeat BC in AM  BMP and CBC in AM  PICC next week  Outpatient f/u with urology  Code Status: full code DVT prophylaxis: SCDs Family Communication: none present. Disposition Plan: home  Brendia Sacks, MD  [facility]  Pager [phone] If 7PM-7AM, please contact night-coverage at [email or web address], password [facility] 06/14/2015, 3:47 PM  LOS: 3 days   Consultants:  Urology  Nephrology  ID on phone  IR   ortho  Procedures:  Aspiration of abscess 7/21  TEE Study Conclusions  - Left ventricle: Systolic function was normal. The  estimated ejection fraction was in the range of 60% to 65%. Wall motion was normal; there were no regional wall motion abnormalities. - Mitral valve: There was mild regurgitation. - Left atrium: No evidence of thrombus in the atrial cavity or appendage. - Right atrium: No evidence of thrombus in the atrial cavity or appendage. - Impressions: TEE: Done in PACU with propofol to r/o SBE. Normal study with no vegetations or abscess trivial MR.  Impressions:  - TEE: Done in PACU with propofol to r/o SBE. Normal study with no vegetations or abscess trivial MR.  Antibiotics:  Vancomycin 06/10/15>>06/12/15  Zosyn 06/11/15>>06/12/15  Daptomycin 06/12/15>>  Eraxis 7/21 >>  HPI/Subjective: Feels better, less pain, ambulating. No fever.  Objective: Filed Vitals:   06/13/15 1538 06/13/15 2005 06/14/15 0625 06/14/15 1451  BP: 149/90 152/90 158/88 133/76  Pulse: 69 89 79 81  Temp: 98.1 F (36.7 C) 98.9 F (37.2 C) 98.1 F (36.7 C) 98.6 F (37 C)  TempSrc: Oral Oral Oral Oral  Resp: 18 18 18 18   Height:      Weight:      SpO2: 98% 99% 97% 99%    Intake/Output Summary (Last 24 hours) at 06/14/15 1547 Last data filed at 06/14/15 0900  Gross per 24 hour  Intake    360 ml  Output    400 ml  Net    -40 ml     Filed Weights   06/11/15 0234 06/13/15 1333  Weight: 72.576 kg (160 lb) 72.576 kg (160 lb)    Exam:     Afebrile >48 hours, VSS General: Appears calm and comfortable Cardiovascular: RRR, no m/r/g. No LE edema. Respiratory: CTA bilaterally, no w/r/r. Normal respiratory effort. Musculoskeletal: grossly normal tone BUE/BLE Psychiatric: grossly  normal mood and affect, speech fluent and appropriate  New data reviewed:  UOP 1000  Creatinine 1.77 >. 1.64  K+ 3.3  Pertinent data since admission:    Pending data:  BC  Scheduled Meds: . anidulafungin  100 mg Intravenous Q24H  . DAPTOmycin (CUBICIN)  IV  6 mg/kg Intravenous Q24H  . docusate sodium   100 mg Oral BID  . furosemide  40 mg Intravenous BID  . gabapentin  300 mg Oral TID  . polyethylene glycol  17 g Oral BID  . senna  1 tablet Oral QHS  . sodium chloride  3 mL Intravenous Q12H  . sodium chloride  3 mL Intravenous Q12H   Continuous Infusions: . sodium chloride 1,000 mL (06/14/15 0625)    Principal Problem:   Candidemia Active Problems:   Iliopsoas abscess   MRSA bacteremia   Acute kidney injury (nontraumatic)   Fever   Anemia   Acute renal failure   Hypokalemia   H/O back injury   Intractable pain   Hematuria   Time spent 20 minutes

## 2015-06-15 LAB — URINALYSIS, ROUTINE W REFLEX MICROSCOPIC
Bilirubin Urine: NEGATIVE
GLUCOSE, UA: NEGATIVE mg/dL
KETONES UR: NEGATIVE mg/dL
LEUKOCYTES UA: NEGATIVE
NITRITE: NEGATIVE
PH: 6.5 (ref 5.0–8.0)
Protein, ur: NEGATIVE mg/dL
Specific Gravity, Urine: 1.01 (ref 1.005–1.030)
Urobilinogen, UA: 0.2 mg/dL (ref 0.0–1.0)

## 2015-06-15 LAB — BASIC METABOLIC PANEL
Anion gap: 9 (ref 5–15)
BUN: 19 mg/dL (ref 6–20)
CO2: 29 mmol/L (ref 22–32)
Calcium: 8.4 mg/dL — ABNORMAL LOW (ref 8.9–10.3)
Chloride: 104 mmol/L (ref 101–111)
Creatinine, Ser: 1.68 mg/dL — ABNORMAL HIGH (ref 0.61–1.24)
GFR calc Af Amer: 59 mL/min — ABNORMAL LOW (ref >60)
GFR, EST NON AFRICAN AMERICAN: 51 mL/min — AB (ref >60)
Glucose, Bld: 107 mg/dL — ABNORMAL HIGH (ref 65–99)
Potassium: 3.4 mmol/L — ABNORMAL LOW (ref 3.5–5.1)
Sodium: 142 mmol/L (ref 135–145)

## 2015-06-15 LAB — CBC
HCT: 29.4 % — ABNORMAL LOW (ref 39.0–52.0)
Hemoglobin: 9.6 g/dL — ABNORMAL LOW (ref 13.0–17.0)
MCH: 26.7 pg (ref 26.0–34.0)
MCHC: 32.7 g/dL (ref 30.0–36.0)
MCV: 81.7 fL (ref 78.0–100.0)
PLATELETS: 317 10*3/uL (ref 150–400)
RBC: 3.6 MIL/uL — ABNORMAL LOW (ref 4.22–5.81)
RDW: 13.5 % (ref 11.5–15.5)
WBC: 8.7 10*3/uL (ref 4.0–10.5)

## 2015-06-15 LAB — CULTURE, BLOOD (ROUTINE X 2)

## 2015-06-15 LAB — URINE MICROSCOPIC-ADD ON

## 2015-06-15 LAB — MPO/PR-3 (ANCA) ANTIBODIES: ANCA Proteinase 3: <3.5 U/mL (ref 0.0–3.5)

## 2015-06-15 MED ORDER — OXYCODONE HCL 5 MG PO TABS: 10.0000 mg | ORAL_TABLET | ORAL | Status: DC | PRN

## 2015-06-15 MED ORDER — POTASSIUM CHLORIDE CRYS ER 20 MEQ PO TBCR
40.0000 meq | EXTENDED_RELEASE_TABLET | Freq: Once | ORAL | Status: AC
  Administered 2015-06-15: 40 meq via ORAL
  Filled 2015-06-15: qty 2

## 2015-06-15 MED ORDER — OXYCODONE HCL 5 MG PO TABS
15.0000 mg | ORAL_TABLET | ORAL | Status: DC | PRN
  Administered 2015-06-15 – 2015-06-18 (×16): 15 mg via ORAL
  Filled 2015-06-15 (×17): qty 3

## 2015-06-15 NOTE — Anesthesia Postprocedure Evaluation (Signed)
  Anesthesia Post-op Note  Patient: [name]  Procedure(s) Performed: Procedure(s): TRANSESOPHAGEAL ECHOCARDIOGRAM (TEE) (N/A)  Patient Location: Room 333  Anesthesia Type:MAC  Level of Consciousness: awake, alert , oriented and patient cooperative  Airway and Oxygen Therapy: Patient Spontanous Breathing  Post-op Pain: moderate  Post-op Assessment: Post-op Vital signs reviewed, Patient's Cardiovascular Status Stable, Respiratory Function Stable, Patent Airway, No signs of Nausea or vomiting and Pain level controlled              Post-op Vital Signs: Reviewed and stable  Last Vitals:  Filed Vitals:   06/15/15 1439  BP: 134/90  Pulse: 104  Temp: 36.8 C  Resp: 18    Complications: No apparent anesthesia complications

## 2015-06-15 NOTE — Progress Notes (Signed)
Subjective: Patient states that he is feeling somewhat better as far as pain is concerned. Presently he doesn't have any nausea or vomiting. Denies also any difficulty breathing.  Objective: Vital signs in last 24 hours: Temp:  [98.2 F (36.8 C)-98.6 F (37 C)] 98.3 F (36.8 C) (07/24 0452) Pulse Rate:  [77-107] 77 (07/24 0452) Resp:  [18] 18 (07/24 0452) BP: (133-157)/(76-101) 157/97 mmHg (07/24 0452) SpO2:  [99 %] 99 % (07/24 0452) Weight:  [180 lb 14.4 oz (82.056 kg)-183 lb 11.2 oz (83.326 kg)] 180 lb 14.4 oz (82.056 kg) (07/24 0500)  Intake/Output from previous day: 07/23 0701 - 07/24 0700 In: 1919.6 [P.O.:480; I.V.:1439.6] Out: 850 [Urine:850] Intake/Output this shift:     Recent Labs  06/13/15 0623 06/15/15 0630  HGB 8.0* 9.6*    Recent Labs  06/13/15 0623 06/15/15 0630  WBC 9.1 8.7  RBC 3.04* 3.60*  HCT 25.2* 29.4*  PLT 262 317    Recent Labs  06/14/15 0950 06/15/15 0630  NA 140 142  K 3.3* 3.4*  CL 105 104  CO2 26 29  BUN 12 19  CREATININE 1.64* 1.68*  GLUCOSE 142* 107*  CALCIUM 8.5* 8.4*   No results for input(s): LABPT, INR in the last 72 hours.   generally patient is alert and in no apparent distress. Chest is clear to auscultation His heart exam reveals regular rate and rhythm no murmur Extremities no edema   Assessment/Plan: Problem #1 acute kidney injury: Patient is nonoliguric. His renal function remains stable.. Etiology was thought to be secondary to ATN/contrast-induced acute kidney injury/vancomycin-induced acute injury. Problem #2 anemia: His hemoglobin is better Problem #3 iliopsoas abscess: Presently is on antibiotics. Patient is afebrile and his white blood cell count is normal. Problem #4 hematuria: Presently seems to be persistent. ASO titer, hepatitis B is negative normal complement. Possible IgA nephropathy/Alport syndrome/if urological workup with negative Problem #5 MRSA bacteremia Problem #6 hypokalemia: Potassium is low  but improving. Plan: 1] We'll give him KCl 40 mEq once a day 2] continue his other treatment as before 3] we'll check his basic metabolic panel in the morning. 4] we'll check UA in the morning   Caro Brundidge S 06/15/2015, 9:04 AM

## 2015-06-15 NOTE — Progress Notes (Signed)
PROGRESS NOTE  [name] UXN:[id number] DOB: February 21, 1976 DOA: 06/11/2015 PCP: Lorayne Marek, MD  Summary: 39 year old man who reports no significant past medical history who was hospitalized in June/July for multiple paraspinal abscesses, iliopsoas abscess on the left. He was seen by infectious disease in [place] and placed on IV antibiotics for MRSA bacteremia as well as abscesses. He underwent drain placement was discharged home. Subsequently drains were removed. He presented to ED 7/20 with increased flank pain and fever. He was was admitted for multiple paraspinal abscesses, AKI, hematuria, anemia.  Assessment/Plan: 1. 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 Afebrile. Eraxis per ID. TEE negative. PICC next week. 2. AKI, stable. Suspect vancomycin induced, stopped 06/12/15. [place] [facility].  3. Multiple paraspinal abscesses (MRSA), psoas abscess, septic arthritis SI joint. S/p aspiration (drain not recommended by IR) 7/21. Will continue daptomycin per ID Dr. Johnnye Sima. 4. Hematuria. Resolving. Etiology unclear. Present 05/15/2015 prior to vanc. Hgb stable. Outpatient f/u with urology planned for cystoscopy. 5. Normocytic anemia. Trending upward. Suspect subacute disease and hematuria. 6. 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  Repeat BC  Continue Eraxis total 2 weeks and daptomycin. Hopefully can place PICC line early next week.  Check BMP in the morning   Outpatient f/u with urology  Code Status: full code DVT prophylaxis: SCDs Family Communication: none present. Discussed plan with pt and he understands, no new concerns.  Disposition Plan: home  Murray Hodgkins, MD  [facility]  Pager [phone] If 7PM-7AM, please contact night-coverage at [email or web address], password [facility] 06/15/2015, 6:30 AM  LOS: 4 days   Consultants:  Urology  Nephrology  ID on phone  IR    Ortho  Procedures:  Aspiration of abscess 7/21  TEE Study Conclusions  - Left ventricle: Systolic function was normal. The estimated ejection fraction was in the range of 60% to 65%. Wall motion was normal; there were no regional wall motion abnormalities. - Mitral valve: There was mild regurgitation. - Left atrium: No evidence of thrombus in the atrial cavity or appendage. - Right atrium: No evidence of thrombus in the atrial cavity or appendage. - Impressions: TEE: Done in PACU with propofol to r/o SBE. Normal study with no vegetations or abscess trivial MR.  Impressions:  - TEE: Done in PACU with propofol to r/o SBE. Normal study with no vegetations or abscess trivial MR.  Antibiotics:  Vancomycin 06/10/15 >>06/12/15  Zosyn 06/11/15 >>06/12/15  Daptomycin 06/13/15 >>  Eraxis 06/12/15 >>  HPI/Subjective: Pt states he feels tired as he had triuble sleeping due to the monitor beeping. He reports the pain to have improved slightly in intensity with the Roxicodone and is no longer going all the way down the LLE, it stops at the buttocks now. He is able to eat despite the pain, no n/v.   Objective: Filed Vitals:   06/14/15 2035 06/14/15 2306 06/15/15 0452 06/15/15 0500  BP: 147/101 136/86 157/97   Pulse: 107 90 77   Temp: 98.2 F (36.8 C)  98.3 F (36.8 C)   TempSrc: Oral  Oral   Resp:   18   Height:      Weight:    82.056 kg (180 lb 14.4 oz)  SpO2: 99%  99%     Intake/Output Summary (Last 24 hours) at 06/15/15 0630 Last data filed at 06/15/15 0120  Gross per 24 hour  Intake    480 ml  Output    850 ml  Net   -370  ml     Filed Weights   06/13/15 1333 06/14/15 1900 06/15/15 0500  Weight: 72.576 kg (160 lb) 83.326 kg (183 lb 11.2 oz) 82.056 kg (180 lb 14.4 oz)    Exam:     Afebrile, VSS General:  Appears comfortable, calm. Cardiovascular: Regular rate and rhythm, no murmur, rub or gallop.  Respiratory: Clear to auscultation bilaterally,  no wheezes, rales or rhonchi. Normal respiratory effort. Musculoskeletal: grossly normal tone bilateral upper and lower extremities Psychiatric: grossly normal mood and affect, speech fluent and appropriate  New data reviewed:  UOP 850  Potassium 3.4. Creatinine without significant change, 1.68.  Hemoglobin improving, 9.6.  Urinalysis RBC decreased, 11-20  Pertinent data since admission:    Pending data:  BC  Scheduled Meds: . anidulafungin  100 mg Intravenous Q24H  . DAPTOmycin (CUBICIN)  IV  6 mg/kg Intravenous Q24H  . docusate sodium  100 mg Oral BID  . furosemide  40 mg Intravenous BID  . gabapentin  300 mg Oral TID  . nicotine  21 mg Transdermal Daily  . polyethylene glycol  17 g Oral BID  . senna  1 tablet Oral QHS  . sodium chloride  3 mL Intravenous Q12H  . sodium chloride  3 mL Intravenous Q12H   Continuous Infusions: . sodium chloride 1,000 mL (06/14/15 2304)    Principal Problem:   Candidemia Active Problems:   Iliopsoas abscess   MRSA bacteremia   Acute kidney injury (nontraumatic)   Fever   Anemia   Acute renal failure   Hypokalemia   H/O back injury   Intractable pain   Hematuria   Time spent 20 minutes   I, Norg'e Tisdol, am scribing for Dr. Melene Plan. Sarajane Jews on 06/15/2015 at 6:38 AM.  I have reviewed the above documentation for accuracy and completeness, and I agree with the above. Murray Hodgkins, MD

## 2015-06-16 ENCOUNTER — Encounter (HOSPITAL_COMMUNITY): Payer: Self-pay | Admitting: Cardiovascular Disease

## 2015-06-16 LAB — CULTURE, BLOOD (ROUTINE X 2): Culture: NO GROWTH

## 2015-06-16 LAB — BASIC METABOLIC PANEL
ANION GAP: 11 (ref 5–15)
BUN: 19 mg/dL (ref 6–20)
CALCIUM: 8.9 mg/dL (ref 8.9–10.3)
CO2: 27 mmol/L (ref 22–32)
CREATININE: 1.64 mg/dL — AB (ref 0.61–1.24)
Chloride: 100 mmol/L — ABNORMAL LOW (ref 101–111)
GFR calc Af Amer: >60 mL/min (ref >60)
GFR, EST NON AFRICAN AMERICAN: 52 mL/min — AB (ref >60)
GLUCOSE: 195 mg/dL — AB (ref 65–99)
Potassium: 3.3 mmol/L — ABNORMAL LOW (ref 3.5–5.1)
Sodium: 138 mmol/L (ref 135–145)

## 2015-06-16 LAB — CULTURE, ROUTINE-ABSCESS: Culture: NO GROWTH

## 2015-06-16 LAB — COMPLEMENT, TOTAL: Compl, Total (CH50): 40 U/mL — ABNORMAL LOW (ref 42–60)

## 2015-06-16 LAB — ANTINUCLEAR ANTIBODIES, IFA: ANA Ab, IFA: NEGATIVE

## 2015-06-16 LAB — CK: CK TOTAL: 16 U/L — AB (ref 49–397)

## 2015-06-16 LAB — MAGNESIUM: Magnesium: 1.9 mg/dL (ref 1.7–2.4)

## 2015-06-16 MED ORDER — POTASSIUM CHLORIDE CRYS ER 20 MEQ PO TBCR
40.0000 meq | EXTENDED_RELEASE_TABLET | Freq: Once | ORAL | Status: AC
  Administered 2015-06-16: 40 meq via ORAL
  Filled 2015-06-16: qty 2

## 2015-06-16 NOTE — Progress Notes (Signed)
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  Remains afebrile. Continue  Eraxis per ID. Day #4. Will need 2 weeks per ID. TEE negative. PICC once repeat blood culture results are negative.   AKI, stable. Suspect vancomycin induced, stopped 06/12/15. Urine output good.  [place] [facility].   Multiple paraspinal abscesses (MRSA), psoas abscess, septic arthritis SI joint. S/p aspiration (drain not recommended by IR) 7/21. Will continue daptomycin per ID Dr. Johnnye Sima. Continues with persistent back pain. Continue with IV and oral analgesia  Hematuria. Much improved. Etiology unclear. Present 05/15/2015 prior to vanc. Hgb stable. Outpatient f/u with urology planned for cystoscopy.  Normocytic anemia. Trending upward. Suspect subacute disease and hematuria.  Elevated Alk phos, present since 6/23, likely secondary to osteomyelitis    Code Status: full Family Communication: wife at bedside Disposition Plan: home when ready   Consultants:  Urology  Nephrology  ID on phone  IR   ortho  Procedures:  Aspiration of abscess  TEE  Antibiotics:  Vancomycin 06/10/15>>06/12/15  levaquin 7/19-7/20  Zosyn 06/11/15>>06/12/15  Daptomycin 06/12/15>> HPI/Subjective: Sitting on side of bed. Complains continued/persistent pain.   Objective: Filed Vitals:   06/16/15 0540  BP: 130/71  Pulse: 83  Temp: 98.2 F (36.8 C)  Resp: 18    Intake/Output Summary (Last 24 hours) at 06/16/15 1042 Last data filed at 06/16/15 0946  Gross per 24 hour  Intake 2871.25 ml  Output      0 ml  Net 2871.25 ml   Filed Weights   06/13/15 1333 06/14/15 1900 06/15/15 0500  Weight: 72.576 kg (160 lb) 83.326 kg (183 lb 11.2 oz) 82.056 kg (180 lb 14.4 oz)    Exam:   General:  Appears uncomfortable  Cardiovascular: RRR no MGR no LE edema  Respiratory:  normal effort BS slightly distant but clear no crackles  Abdomen: soft non-distended non-tender +BS no guarding  Musculoskeletal: joints without swelling/erythema. Lumbar spine area tender to touch    Data Reviewed: Basic Metabolic Panel:  Recent Labs Lab 06/12/15 0708 06/13/15 0623 06/14/15 0950 06/15/15 0630 06/16/15 0905  NA 142 142 140 142 138  K 3.6 3.5 3.3* 3.4* 3.3*  CL 109 108 105 104 100*  CO2 $Re'27 26 26 29 27  'oAr$ GLUCOSE 110* 106* 142* 107* 195*  BUN $Re'14 15 12 19 19  'bsB$ CREATININE 1.66* 1.77* 1.64* 1.68* 1.64*  CALCIUM 8.1* 8.3* 8.5* 8.4* 8.9   Liver Function Tests:  Recent Labs Lab 06/09/15 1848 06/11/15 0345 06/12/15 0708  AST 12* 12* 22  ALT 13* 11* 17  ALKPHOS 164* 163* 242*  BILITOT 0.5 0.6 0.3  PROT 6.5 7.0 6.2*  ALBUMIN 2.5* 2.6* 2.4*    Recent Labs Lab 06/09/15 1848  LIPASE 16*   No results for input(s): AMMONIA in the last 168 hours. CBC:  Recent Labs Lab 06/09/15 1848 06/11/15 0345 06/12/15 0708 06/13/15 0623 06/15/15 0630  WBC 8.4 11.4* 6.6 9.1 8.7  NEUTROABS  --  9.6*  --   --   --   HGB 8.1* 8.4* 7.6* 8.0* 9.6*  HCT 25.6* 25.4* 23.3* 25.2* 29.4*  MCV 81.3 81.4 83.2 82.9 81.7  PLT 248 243 165 262 317   Cardiac Enzymes:  Recent Labs Lab 06/11/15 0634 06/14/15 0950 06/16/15 0905  CKTOTAL  --  18* 16*  TROPONINI <0.03  --   --    BNP (last 3 results) No results for  input(s): BNP in the last 8760 hours.  ProBNP (last 3 results) No results for input(s): PROBNP in the last 8760 hours.  CBG: No results for input(s): GLUCAP in the last 168 hours.  Recent Results (from the past 240 hour(s))  Urine culture     Status: None   Collection Time: 06/09/15  7:47 PM  Result Value Ref Range Status   Specimen Description URINE, CLEAN CATCH  Final   Special Requests NONE  Final   Culture NO GROWTH 1 DAY  Final   Report Status 06/11/2015 FINAL  Final  Culture, blood (routine x 2)     Status: None (Preliminary result)   Collection Time:  06/11/15  3:45 AM  Result Value Ref Range Status   Specimen Description BLOOD LEFT ANTECUBITAL DRAWN BY RN  Final   Special Requests   Final    BOTTLES DRAWN AEROBIC AND ANAEROBIC AEB=8CC ANA=10CC   Culture  Setup Time   Final    YEAST RECOVERED FROM THE AEROBIC BOTTLE Gram Stain Report Called to,Read Back By and Verified With: BULLINS,M. AT 0945 ON 06/12/2015 BY RESSEGGER,R. Performed at [facility]    Culture NO GROWTH 4 DAYS  Final   Report Status PENDING  Incomplete  Culture, blood (routine x 2)     Status: None   Collection Time: 06/11/15  3:55 AM  Result Value Ref Range Status   Specimen Description BLOOD PICC LINE DRAWN BY RN TT  Final   Special Requests BOTTLES DRAWN AEROBIC ONLY 8CC  Final   Culture  Setup Time   Final    YEAST RECOVERED FORM THE AEROBIC BOTTLE Gram Stain Report Called to,Read Back By and Verified With: BULLINS,M. AT 0945 ON 06/12/2015 BY RESSEGGER,R.    Culture   Final    CANDIDA PARAPSILOSIS Performed at [facility]    Report Status 06/15/2015 FINAL  Final  Culture, routine-abscess     Status: None   Collection Time: 06/12/15  4:10 PM  Result Value Ref Range Status   Specimen Description ABSCESS  Final   Special Requests PSOAS LEFT MUSCLE  Final   Gram Stain   Final    MODERATE WBC PRESENT,BOTH PMN AND MONONUCLEAR NO SQUAMOUS EPITHELIAL CELLS SEEN NO ORGANISMS SEEN Performed at [facility]    Culture   Final    NO GROWTH 3 DAYS Performed at [facility]    Report Status 06/16/2015 FINAL  Final     Studies: No results found.  Scheduled Meds: . anidulafungin  100 mg Intravenous Q24H  . DAPTOmycin (CUBICIN)  IV  6 mg/kg Intravenous Q24H  . docusate sodium  100 mg Oral BID  . furosemide  40 mg Intravenous BID  . gabapentin  300 mg Oral TID  . nicotine  21 mg Transdermal Daily  . polyethylene glycol  17 g Oral BID  . potassium chloride  40 mEq Oral Once  . senna  1 tablet Oral QHS  . sodium chloride  3 mL  Intravenous Q12H  . sodium chloride  3 mL Intravenous Q12H   Continuous Infusions: . sodium chloride 1,000 mL (06/16/15 0835)    Principal Problem:   Candidemia Active Problems:   Iliopsoas abscess   MRSA bacteremia   Acute kidney injury (nontraumatic)   Fever   Anemia   Acute renal failure   Hypokalemia   H/O back injury   Intractable pain   Hematuria    Time spent: Yell  [facility] Pager [phone]. If 7PM-7AM, please contact night-coverage at [email or web address], password [facility] 06/16/2015, 10:42 AM  LOS: 5 days

## 2015-06-16 NOTE — Plan of Care (Addendum)
Pt complaining of continued issues with pain control.  Patient insisting to have dilaudid q2  And oxy IR at same time.  RN educated on diff between IV and PO meds.  Suggested switching between PO and IV q2hours to keep patient from having "yo-yo" pain. Also - Dilaudid works best for breakthough pain and is not intended to maintain "pain stability." Patient agreed and voiced understanding to education and plan of care. Pain meds placed on board and next times Due.

## 2015-06-16 NOTE — Plan of Care (Signed)
Pt called tech and shared that he didn't feel he could handle pain med plan of care and needed to switch nurses to get what he needs.  Tamika, RN will be taking over pt's care. - Dr. Bevely Palmer Aware.

## 2015-06-16 NOTE — Progress Notes (Signed)
Pt refused to be weighed this morning. States that his leg was hurting.

## 2015-06-16 NOTE — Care Management Note (Signed)
Case Management Note  Patient Details  Name: [name] MRN: [id number] Date of Birth: Aug 15, 1976  Subjective/Objective:                    Action/Plan:   Expected Discharge Date:                  Expected Discharge Plan:  Home w Home Health Services  In-House Referral:  NA  Discharge planning Services  CM Consult  Post Acute Care Choice:  Resumption of Svcs/PTA Provider Choice offered to:     DME Arranged:    DME Agency:     HH Arranged:    HH Agency:     Status of Service:  In process, will continue to follow  Medicare Important Message Given:    Date Medicare IM Given:    Medicare IM give by:    Date Additional Medicare IM Given:    Additional Medicare Important Message give by:     If discussed at Long Length of Stay Meetings, dates discussed:    Additional Comments: Awaiting 2nd set of BC results so that new PICC can be inserted. Pt will discharge home with [facility] RN for IV AB. Will continue to follow. Arlyss Queen [facility] [facility], RN 06/16/2015, 12:15 PM

## 2015-06-16 NOTE — Progress Notes (Signed)
Subjective: Patient still complains of back and leg pain on and off. His appetite is improving and he doesn't have any nausea vomiting and no difficulty breathing.  Objective: Vital signs in last 24 hours: Temp:  [98.2 F (36.8 C)-98.3 F (36.8 C)] 98.2 F (36.8 C) (07/25 0540) Pulse Rate:  [83-104] 83 (07/25 0540) Resp:  [18-20] 18 (07/25 0540) BP: (130-137)/(71-92) 130/71 mmHg (07/25 0540) SpO2:  [99 %-100 %] 99 % (07/25 0540)  Intake/Output from previous day: 07/24 0701 - 07/25 0700 In: 2631.3 [P.O.:1200; I.V.:1431.3] Out: -  Intake/Output this shift:     Recent Labs  06/15/15 0630  HGB 9.6*    Recent Labs  06/15/15 0630  WBC 8.7  RBC 3.60*  HCT 29.4*  PLT 317    Recent Labs  06/14/15 0950 06/15/15 0630  NA 140 142  K 3.3* 3.4*  CL 105 104  CO2 26 29  BUN 12 19  CREATININE 1.64* 1.68*  GLUCOSE 142* 107*  CALCIUM 8.5* 8.4*   No results for input(s): LABPT, INR in the last 72 hours.   generally patient is alert and in no apparent distress. Chest is clear to auscultation His heart exam reveals regular rate and rhythm no murmur Extremities no edema   Assessment/Plan: Problem #1 acute kidney injury: Patient is nonoliguric.  Etiology was thought to be secondary to ATN/contrast-induced acute kidney injury/vancomycin-induced acute injury. Presently blood work is not done because patient declined blood drawing as they have attempted about twice this morning and unable to draw any blood. Problem #2 anemia: His hemoglobin is better and stable. Problem #3 iliopsoas abscess: Presently is on antibiotics. Patient is afebrile and his white blood cell count is normal. Problem #4 hematuria: Presently seems to be persistent. ASO titer, hepatitis B surface antigen, hepatitis C antibody and ANA is negative. Patient was  normal complement. Possible IgA nephropathy/Alport syndrome/if urological workup with negative Problem #5 MRSA bacteremia Problem #6 hypokalemia:  Potassium is low but stable. Patient has received potassium 40 mEq yesterday.. Plan: 1] have discussed with him about the importance of doing blood work and presently he has a hence we'll try to do basic metabolic panel. 2] we'll check his basic metabolic panel in the morning.   Keirsten Matuska S 06/16/2015, 8:52 AM

## 2015-06-16 NOTE — Plan of Care (Signed)
Pt continues to refuses vitals, to get out of bed for daily wts and labs - d/t being in too much pain. Pt educated by RN for need to cooperate with care as much as possible so we can help him get better.  Pt agreed to [facility] lab come back and draw - lab notified.

## 2015-06-16 NOTE — Progress Notes (Signed)
ANTIBIOTIC CONSULT NOTE- follow up  Pharmacy Consult for Cubicin Indication: Bacteremia  Allergies  Allergen Reactions  . Vicodin [Hydrocodone-Acetaminophen] Hives and Itching  . Adhesive [Tape] Rash   Patient Measurements: Height: 6' (182.9 cm) Weight: 180 lb 14.4 oz (82.056 kg) IBW/kg (Calculated) : 77.6  Vital Signs: Temp: 98.2 F (36.8 C) (07/25 0540) Temp Source: Oral (07/25 0540) BP: 130/71 mmHg (07/25 0540) Pulse Rate: 83 (07/25 0540)  Labs:  Recent Labs  06/14/15 0950 06/15/15 0630 06/16/15 0905  WBC  --  8.7  --   HGB  --  9.6*  --   PLT  --  317  --   CREATININE 1.64* 1.68* 1.64*   Estimated Creatinine Clearance: 68.3 mL/min (by C-G formula based on Cr of 1.64).  No results for input(s): VANCOTROUGH, VANCOPEAK, VANCORANDOM, GENTTROUGH, GENTPEAK, GENTRANDOM, TOBRATROUGH, TOBRAPEAK, TOBRARND, AMIKACINPEAK, AMIKACINTROU, AMIKACIN in the last 72 hours.   Microbiology: Recent Results (from the past 720 hour(s))  Culture, blood (routine x 2)     Status: None   Collection Time: 05/18/15 12:30 PM  Result Value Ref Range Status   Specimen Description BLOOD LEFT HAND  Final   Special Requests BOTTLES DRAWN AEROBIC AND ANAEROBIC 5CC  Final   Culture NO GROWTH 5 DAYS  Final   Report Status 05/23/2015 FINAL  Final  Culture, blood (routine x 2)     Status: None   Collection Time: 05/18/15 12:40 PM  Result Value Ref Range Status   Specimen Description BLOOD LEFT HAND  Final   Special Requests BOTTLES DRAWN AEROBIC AND ANAEROBIC 5CC  Final   Culture NO GROWTH 5 DAYS  Final   Report Status 05/23/2015 FINAL  Final  Blood culture (routine x 2)     Status: None   Collection Time: 06/02/15  4:20 PM  Result Value Ref Range Status   Specimen Description BLOOD RIGHT ANTECUBITAL  Final   Special Requests BOTTLES DRAWN AEROBIC AND ANAEROBIC 6CC  Final   Culture NO GROWTH 7 DAYS  Final   Report Status 06/09/2015 FINAL  Final  Blood culture (routine x 2)     Status: None   Collection Time: 06/02/15  4:27 PM  Result Value Ref Range Status   Specimen Description BLOOD LEFT HAND  Final   Special Requests BOTTLES DRAWN AEROBIC AND ANAEROBIC 6CC  Final   Culture NO GROWTH 7 DAYS  Final   Report Status 06/09/2015 FINAL  Final  Urine culture     Status: None   Collection Time: 06/09/15  7:47 PM  Result Value Ref Range Status   Specimen Description URINE, CLEAN CATCH  Final   Special Requests NONE  Final   Culture NO GROWTH 1 DAY  Final   Report Status 06/11/2015 FINAL  Final  Culture, blood (routine x 2)     Status: None   Collection Time: 06/11/15  3:45 AM  Result Value Ref Range Status   Specimen Description BLOOD LEFT ANTECUBITAL DRAWN BY RN  Final   Special Requests   Final    BOTTLES DRAWN AEROBIC AND ANAEROBIC AEB=8CC ANA=10CC   Culture  Setup Time   Final    YEAST RECOVERED FROM THE AEROBIC BOTTLE Gram Stain Report Called to,Read Back By and Verified With: BULLINS,M. AT 0945 ON 06/12/2015 BY RESSEGGER,R. Performed at [facility]    Culture NO GROWTH 5 DAYS  Final   Report Status 06/16/2015 FINAL  Final  Culture, blood (routine x 2)     Status: None  Collection Time: 06/11/15  3:55 AM  Result Value Ref Range Status   Specimen Description BLOOD PICC LINE DRAWN BY RN TT  Final   Special Requests BOTTLES DRAWN AEROBIC ONLY 8CC  Final   Culture  Setup Time   Final    YEAST RECOVERED FORM THE AEROBIC BOTTLE Gram Stain Report Called to,Read Back By and Verified With: BULLINS,M. AT 0945 ON 06/12/2015 BY RESSEGGER,R.    Culture   Final    CANDIDA PARAPSILOSIS Performed at [facility]    Report Status 06/15/2015 FINAL  Final  Culture, routine-abscess     Status: None   Collection Time: 06/12/15  4:10 PM  Result Value Ref Range Status   Specimen Description ABSCESS  Final   Special Requests PSOAS LEFT MUSCLE  Final   Gram Stain   Final    MODERATE WBC PRESENT,BOTH PMN AND MONONUCLEAR NO SQUAMOUS EPITHELIAL CELLS SEEN NO ORGANISMS  SEEN Performed at [facility]    Culture   Final    NO GROWTH 3 DAYS Performed at [facility]    Report Status 06/16/2015 FINAL  Final  Culture, blood (routine x 2)     Status: None (Preliminary result)   Collection Time: 06/15/15  2:36 PM  Result Value Ref Range Status   Specimen Description BLOOD LEFT ARM  Final   Special Requests   Final    BOTTLES DRAWN AEROBIC AND ANAEROBIC AEB 8CC ANA 8CC   Culture NO GROWTH < 24 HOURS  Final   Report Status PENDING  Incomplete  Culture, blood (routine x 2)     Status: None (Preliminary result)   Collection Time: 06/15/15  2:50 PM  Result Value Ref Range Status   Specimen Description BLOOD RIGHT ARM  Final   Special Requests   Final    BOTTLES DRAWN AEROBIC AND ANAEROBIC  AEB 8CC ANA 6CC   Culture NO GROWTH < 24 HOURS  Final   Report Status PENDING  Incomplete   Medical History: Past Medical History  Diagnosis Date  . H/O back injury   . MRSA (methicillin resistant Staphylococcus aureus)   . 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 MRSA bacteremia 04/2015   Medications:  Scheduled:  . anidulafungin  100 mg Intravenous Q24H  . DAPTOmycin (CUBICIN)  IV  6 mg/kg Intravenous Q24H  . docusate sodium  100 mg Oral BID  . furosemide  40 mg Intravenous BID  . gabapentin  300 mg Oral TID  . nicotine  21 mg Transdermal Daily  . polyethylene glycol  17 g Oral BID  . senna  1 tablet Oral QHS  . sodium chloride  3 mL Intravenous Q12H  . sodium chloride  3 mL Intravenous Q12H   Anti-infectives    Start     Dose/Rate Route Frequency Ordered Stop   06/13/15 1800  anidulafungin (ERAXIS) 100 mg in sodium chloride 0.9 % 100 mL IVPB     100 mg over 90 Minutes Intravenous Every 24 hours 06/12/15 1452     06/13/15 1600  fluconazole (DIFLUCAN) IVPB 400 mg  Status:  Discontinued     400 mg 100 mL/hr over 120 Minutes Intravenous Every 24 hours 06/12/15 1441 06/12/15 1444   06/13/15 1300  DAPTOmycin (CUBICIN) 435.5 mg in sodium chloride  0.9 % IVPB     6 mg/kg  72.6 kg 217.4 mL/hr over 30 Minutes Intravenous Every 24 hours 06/13/15 1149     06/12/15 1900  anidulafungin (ERAXIS) 200  mg in sodium chloride 0.9 % 200 mL IVPB     200 mg over 180 Minutes Intravenous Every 24 hours 06/12/15 1510 06/13/15 0322   06/12/15 1600  fluconazole (DIFLUCAN) IVPB 800 mg  Status:  Discontinued     800 mg 200 mL/hr over 120 Minutes Intravenous  Once 06/12/15 1441 06/12/15 1444   06/12/15 1600  anidulafungin (ERAXIS) 200 mg in sodium chloride 0.9 % 200 mL IVPB  Status:  Discontinued     200 mg over 180 Minutes Intravenous Every 24 hours 06/12/15 1452 06/12/15 1510   06/11/15 2200  vancomycin (VANCOCIN) IVPB 1000 mg/200 mL premix  Status:  Discontinued     1,000 mg 200 mL/hr over 60 Minutes Intravenous Every 12 hours 06/11/15 0813 06/11/15 0819   06/11/15 2200  vancomycin (VANCOCIN) 1,250 mg in sodium chloride 0.9 % 250 mL IVPB  Status:  Discontinued     1,250 mg 166.7 mL/hr over 90 Minutes Intravenous Every 12 hours 06/11/15 0819 06/13/15 0741   06/11/15 1200  levofloxacin (LEVAQUIN) IVPB 750 mg  Status:  Discontinued     750 mg 100 mL/hr over 90 Minutes Intravenous Every 24 hours 06/11/15 0809 06/11/15 1802   06/11/15 1000  piperacillin-tazobactam (ZOSYN) IVPB 3.375 g  Status:  Discontinued     3.375 g 12.5 mL/hr over 240 Minutes Intravenous Every 8 hours 06/11/15 0807 06/12/15 1805   06/11/15 1000  vancomycin (VANCOCIN) 1,500 mg in sodium chloride 0.9 % 500 mL IVPB     1,500 mg 250 mL/hr over 120 Minutes Intravenous  Once 06/11/15 0808 06/11/15 1316   06/11/15 0700  vancomycin (VANCOCIN) IVPB 1000 mg/200 mL premix  Status:  Discontinued     1,000 mg 200 mL/hr over 60 Minutes Intravenous  Once 06/11/15 0611 06/11/15 0808   06/11/15 0630  levofloxacin (LEVAQUIN) IVPB 750 mg  Status:  Discontinued     750 mg 100 mL/hr over 90 Minutes Intravenous  Once 06/11/15 0611 06/11/15 0809   06/11/15 0615  piperacillin-tazobactam (ZOSYN) IVPB  3.375 g  Status:  Discontinued     3.375 g 12.5 mL/hr over 240 Minutes Intravenous  Once 06/11/15 1610 06/11/15 9604     Assessment: 39 yo male admitted for workup due to fever and CXR showing atelectasis vs pneumonia. Hx MRSA, poor renal function (Vanc stopped).  Also on anidulafungin for (+) yeast in recent BCx.  SCr stable.  Goal of Therapy:  Eradicate infection.   Plan:  Continue Daptomycin /kg IV every 24 hours. Duration of therapy per MD / ID Monitor labs, micro and vitals including CK results.   Valrie Hart A, RPH 06/16/2015,11:38 AM

## 2015-06-17 ENCOUNTER — Ambulatory Visit: Payer: Self-pay | Admitting: Internal Medicine

## 2015-06-17 DIAGNOSIS — R52 Pain, unspecified: Secondary | ICD-10-CM

## 2015-06-17 DIAGNOSIS — R509 Fever, unspecified: Secondary | ICD-10-CM | POA: Insufficient documentation

## 2015-06-17 DIAGNOSIS — L0291 Cutaneous abscess, unspecified: Secondary | ICD-10-CM

## 2015-06-17 LAB — CBC
HCT: 32.7 % — ABNORMAL LOW (ref 39.0–52.0)
Hemoglobin: 10.4 g/dL — ABNORMAL LOW (ref 13.0–17.0)
MCH: 26.3 pg (ref 26.0–34.0)
MCHC: 31.8 g/dL (ref 30.0–36.0)
MCV: 82.8 fL (ref 78.0–100.0)
Platelets: 404 10*3/uL — ABNORMAL HIGH (ref 150–400)
RBC: 3.95 MIL/uL — ABNORMAL LOW (ref 4.22–5.81)
RDW: 13.8 % (ref 11.5–15.5)
WBC: 13.6 10*3/uL — ABNORMAL HIGH (ref 4.0–10.5)

## 2015-06-17 LAB — BASIC METABOLIC PANEL
Anion gap: 11 (ref 5–15)
BUN: 20 mg/dL (ref 6–20)
CHLORIDE: 99 mmol/L — AB (ref 101–111)
CO2: 28 mmol/L (ref 22–32)
CREATININE: 1.73 mg/dL — AB (ref 0.61–1.24)
Calcium: 9.2 mg/dL (ref 8.9–10.3)
GFR calc non Af Amer: 49 mL/min — ABNORMAL LOW (ref >60)
GFR, EST AFRICAN AMERICAN: 57 mL/min — AB (ref >60)
Glucose, Bld: 121 mg/dL — ABNORMAL HIGH (ref 65–99)
Potassium: 3.6 mmol/L (ref 3.5–5.1)
Sodium: 138 mmol/L (ref 135–145)

## 2015-06-17 MED ORDER — POTASSIUM CHLORIDE CRYS ER 20 MEQ PO TBCR
30.0000 meq | EXTENDED_RELEASE_TABLET | Freq: Two times a day (BID) | ORAL | Status: DC
  Administered 2015-06-17 – 2015-06-19 (×5): 30 meq via ORAL
  Filled 2015-06-17 (×10): qty 1

## 2015-06-17 NOTE — Progress Notes (Signed)
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 Remains afebrile. Continue Eraxis per ID. Day #5. Will need 2 weeks per ID. TEE negative. PICC once repeat blood culture results are negative.   AKI. Await BMET results. Urine output improved.  Suspect vancomycin induced, stopped 06/12/15. [facility]. Lasix discontinued per nephrology.    Multiple paraspinal abscesses (MRSA), psoas abscess, septic arthritis SI joint. S/p aspiration (drain not recommended by IR) 7/21. Will continue daptomycin day #6. Continues with persistent back pain. Continue with IV and oral analgesia  Hematuria. Much improved. Etiology unclear. Present 05/15/2015 prior to vanc. Hgb stable. Outpatient f/u with urology planned for cystoscopy.  Normocytic anemia. Trending upward. Suspect subacute disease and hematuria.  Elevated Alk phos, present since 6/23, likely secondary to osteomyelitis  Hypokalemia: likley related to Lasix. Will replete and recheck   Leukocytosis: WBC trending up today. Has been trending downward since 06/11/15. Afebrile. Antibiotics as below. Will monitor closely. If no improvement consider repeat CT/MRI to assess for worsening abscess as well as repeat urinalysis, chest xray     Code Status: full Family Communication:  Disposition Plan: home when ready hopefully 06/19/15   Consultants:  Urology  Nephrology  ID on phone  IR   ortho  Procedures:  Aspiration of abscess  TEE  Antibiotics:  Vancomycin 06/10/15>>06/12/15  levaquin 7/19-7/20  Zosyn 06/11/15>>06/12/15  Daptomycin 06/12/15>>  HPI/Subjective: Reports continued back pain.   Objective: Filed Vitals:   06/17/15 0625  BP: 145/88  Pulse: 86  Temp: 98 F (36.7 C)  Resp: 18    Intake/Output Summary (Last 24 hours) at 06/17/15 1128 Last data filed at 06/17/15 0300   Gross per 24 hour  Intake 3170.79 ml  Output   2750 ml  Net 420.79 ml   Filed Weights   06/14/15 1900 06/15/15 0500 06/17/15 0625  Weight: 83.326 kg (183 lb 11.2 oz) 82.056 kg (180 lb 14.4 oz) 81.829 kg (180 lb 6.4 oz)    Exam:   General:  Well nourished appears slightly uncomfortable  Cardiovascular: RRR no MGR No LE edema  Respiratory: normal effort BS clear bilaterally no wheeze  Abdomen: non-distended +BS soft non-tender to palpation  Musculoskeletal: no clubbing or cyanosis. Some CVA tenderness   Data Reviewed: Basic Metabolic Panel:  Recent Labs Lab 06/12/15 0708 06/13/15 0623 06/14/15 0950 06/15/15 0630 06/16/15 0901 06/16/15 0905  NA 142 142 140 142  --  138  K 3.6 3.5 3.3* 3.4*  --  3.3*  CL 109 108 105 104  --  100*  CO2 _0 --  27  GLUCOSE 110* 106* 142* 107*  --  195*  BUN _1 --  19  CREATININE 1.66* 1.77* 1.64* 1.68*  --  1.64*  CALCIUM 8.1* 8.3* 8.5* 8.4*  --  8.9  MG  --   --   --   --  1.9  --    Liver Function Tests:  Recent Labs Lab 06/11/15 0345 06/12/15 0708  AST 12* 22  ALT 11* 17  ALKPHOS 163* 242*  BILITOT 0.6 0.3  PROT 7.0 6.2*  ALBUMIN 2.6* 2.4*   No results for input(s): LIPASE, AMYLASE in the last 168 hours. No results for input(s): AMMONIA in the last 168 hours. CBC:  Recent Labs Lab 06/11/15 0345 06/12/15 0708 06/13/15 0623 06/15/15 0630 06/17/15 1101  WBC 11.4* 6.6 9.1 8.7 13.6*  NEUTROABS 9.6*  --   --   --   --   HGB 8.4* 7.6* 8.0* 9.6* 10.4*  HCT 25.4* 23.3* 25.2* 29.4* 32.7*  MCV 81.4 83.2 82.9 81.7 82.8  PLT 243 165 262 317 404*   Cardiac Enzymes:  Recent Labs Lab 06/11/15 0634 06/14/15 0950 06/16/15 0905  CKTOTAL  --  18* 16*  TROPONINI <0.03  --   --    BNP (last 3 results) No results for input(s): BNP in the last 8760 hours.  ProBNP (last 3 results) No results for input(s): PROBNP in the last 8760 hours.  CBG: No results for input(s): GLUCAP in the last 168  hours.  Recent Results (from the past 240 hour(s))  Urine culture     Status: None   Collection Time: 06/09/15  7:47 PM  Result Value Ref Range Status   Specimen Description URINE, CLEAN CATCH  Final   Special Requests NONE  Final   Culture NO GROWTH 1 DAY  Final   Report Status 06/11/2015 FINAL  Final  Culture, blood (routine x 2)     Status: None   Collection Time: 06/11/15  3:45 AM  Result Value Ref Range Status   Specimen Description BLOOD LEFT ANTECUBITAL DRAWN BY RN  Final   Special Requests   Final    BOTTLES DRAWN AEROBIC AND ANAEROBIC AEB=8CC ANA=10CC   Culture  Setup Time   Final    YEAST RECOVERED FROM THE AEROBIC BOTTLE Gram Stain Report Called to,Read Back By and Verified With: BULLINS,M. AT 0945 ON 06/12/2015 BY RESSEGGER,R. Performed at [facility]    Culture NO GROWTH 5 DAYS  Final   Report Status 06/16/2015 FINAL  Final  Culture, blood (routine x 2)     Status: None   Collection Time: 06/11/15  3:55 AM  Result Value Ref Range Status   Specimen Description BLOOD PICC LINE DRAWN BY RN TT  Final   Special Requests BOTTLES DRAWN AEROBIC ONLY 8CC  Final   Culture  Setup Time   Final    YEAST RECOVERED FORM THE AEROBIC BOTTLE Gram Stain Report Called to,Read Back By and Verified With: BULLINS,M. AT 0945 ON 06/12/2015 BY RESSEGGER,R.    Culture   Final    CANDIDA PARAPSILOSIS Performed at [facility]    Report Status 06/15/2015 FINAL  Final  Culture, routine-abscess     Status: None   Collection Time: 06/12/15  4:10 PM  Result Value Ref Range Status   Specimen Description ABSCESS  Final   Special Requests PSOAS LEFT MUSCLE  Final   Gram Stain   Final    MODERATE WBC PRESENT,BOTH PMN AND MONONUCLEAR NO SQUAMOUS EPITHELIAL CELLS SEEN NO ORGANISMS SEEN Performed at [facility]    Culture   Final    NO GROWTH 3 DAYS Performed at [facility]    Report Status 06/16/2015 FINAL  Final  Culture, blood (routine x 2)     Status:  None (Preliminary result)   Collection Time: 06/15/15  2:36 PM  Result Value Ref Range Status   Specimen Description BLOOD LEFT ARM  Final   Special Requests BOTTLES DRAWN AEROBIC AND ANAEROBIC 8CC EACH  Final   Culture NO GROWTH 2 DAYS  Final   Report Status PENDING  Incomplete  Culture, blood (routine x 2)     Status: None (Preliminary result)   Collection Time: 06/15/15  2:50 PM  Result Value Ref Range Status   Specimen Description BLOOD  RIGHT ARM  Final   Special Requests   Final    BOTTLES DRAWN AEROBIC AND ANAEROBIC AEB=8CC ANA=6CC   Culture NO GROWTH 2 DAYS  Final   Report Status PENDING  Incomplete     Studies: No results found.  Scheduled Meds: . anidulafungin  100 mg Intravenous Q24H  . DAPTOmycin (CUBICIN)  IV  6 mg/kg Intravenous Q24H  . docusate sodium  100 mg Oral BID  . gabapentin  300 mg Oral TID  . nicotine  21 mg Transdermal Daily  . polyethylene glycol  17 g Oral BID  . potassium chloride  30 mEq Oral BID  . senna  1 tablet Oral QHS  . sodium chloride  3 mL Intravenous Q12H  . sodium chloride  3 mL Intravenous Q12H   Continuous Infusions: . sodium chloride 1,000 mL (06/17/15 0936)    Principal Problem:   Candidemia Active Problems:   Iliopsoas abscess   MRSA bacteremia   Acute kidney injury (nontraumatic)   Fever   Anemia   Acute renal failure   Hypokalemia   H/O back injury   Intractable pain   Hematuria    Time spent: 30 minutes    [facility] Pager [phone]. If 7PM-7AM, please contact night-coverage at [email or web address], password [facility] 06/17/2015, 11:28 AM  LOS: 6 days

## 2015-06-17 NOTE — Progress Notes (Signed)
Subjective: Patient does not have any new complaints except his back. Presently he says that his back pain continues to be an issue and is not getting any better. Objective: Vital signs in last 24 hours: Temp:  [98 F (36.7 C)-98.3 F (36.8 C)] 98 F (36.7 C) (07/26 0625) Pulse Rate:  [74-94] 86 (07/26 0625) Resp:  [18] 18 (07/26 0625) BP: (122-145)/(71-94) 145/88 mmHg (07/26 0625) SpO2:  [100 %] 100 % (07/26 0625) Weight:  [180 lb 6.4 oz (81.829 kg)] 180 lb 6.4 oz (81.829 kg) (07/26 0625)  Intake/Output from previous day: 07/25 0701 - 07/26 0700 In: 3650.8 [P.O.:720; I.V.:2302.1; IV Piggyback:628.7] Out: 3550 [Urine:3550] Intake/Output this shift:     Recent Labs  06/15/15 0630  HGB 9.6*    Recent Labs  06/15/15 0630  WBC 8.7  RBC 3.60*  HCT 29.4*  PLT 317    Recent Labs  06/15/15 0630 06/16/15 0905  NA 142 138  K 3.4* 3.3*  CL 104 100*  CO2 29 27  BUN 19 19  CREATININE 1.68* 1.64*  GLUCOSE 107* 195*  CALCIUM 8.4* 8.9   No results for input(s): LABPT, INR in the last 72 hours.   generally patient is alert and in no apparent distress. Chest is clear to auscultation His heart exam reveals regular rate and rhythm no murmur Extremities no edema   Assessment/Plan: Problem #1 acute kidney injury: Patient is nonoliguric.  Etiology was thought to be secondary to ATN/contrast-induced acute kidney injury/vancomycin-induced acute injury. His renal function started improving. Patient had about 3500 mL of urine output. Presently he is on diuretics. Problem #2 anemia: His hemoglobin is better and stable. Problem #3 iliopsoas abscess: Presently is on antibiotics. Patient is afebrile and his white blood cell count is normal. Problem #4 hematuria: Presently seems to be persistent. ASO titer, hepatitis B surface antigen, hepatitis C antibody and ANA is negative. Patient was  normal complement. Possible IgA nephropathy/Alport syndrome/if urological workup with  negative Problem #5 MRSA bacteremia Problem #6 hypokalemia: Potassium is low . Presently has declined further. Plan: 1] we'll start patient on KCl 30 mEq by mouth twice a day 2] we'll check his basic metabolic panel in the morning. 3] we'll DC Lasix   [name] S 06/17/2015, 9:45 AM

## 2015-06-17 NOTE — Progress Notes (Signed)
Patient refused lab draw per technician, will try again later

## 2015-06-18 DIAGNOSIS — B377 Candidal sepsis: Principal | ICD-10-CM

## 2015-06-18 DIAGNOSIS — N179 Acute kidney failure, unspecified: Secondary | ICD-10-CM

## 2015-06-18 DIAGNOSIS — K6812 Psoas muscle abscess: Secondary | ICD-10-CM

## 2015-06-18 LAB — BASIC METABOLIC PANEL
Anion gap: 10 (ref 5–15)
BUN: 21 mg/dL — AB (ref 6–20)
CO2: 29 mmol/L (ref 22–32)
CREATININE: 1.95 mg/dL — AB (ref 0.61–1.24)
Calcium: 9.2 mg/dL (ref 8.9–10.3)
Chloride: 102 mmol/L (ref 101–111)
GFR calc Af Amer: 49 mL/min — ABNORMAL LOW (ref >60)
GFR calc non Af Amer: 42 mL/min — ABNORMAL LOW (ref >60)
Glucose, Bld: 97 mg/dL (ref 65–99)
Potassium: 4.1 mmol/L (ref 3.5–5.1)
SODIUM: 141 mmol/L (ref 135–145)

## 2015-06-18 LAB — CBC
HEMATOCRIT: 32.2 % — AB (ref 39.0–52.0)
Hemoglobin: 10.1 g/dL — ABNORMAL LOW (ref 13.0–17.0)
MCH: 26.4 pg (ref 26.0–34.0)
MCHC: 31.4 g/dL (ref 30.0–36.0)
MCV: 84.1 fL (ref 78.0–100.0)
PLATELETS: 359 10*3/uL (ref 150–400)
RBC: 3.83 MIL/uL — AB (ref 4.22–5.81)
RDW: 14.1 % (ref 11.5–15.5)
WBC: 8.6 10*3/uL (ref 4.0–10.5)

## 2015-06-18 MED ORDER — HYDROMORPHONE HCL 1 MG/ML IJ SOLN
0.5000 mg | INTRAMUSCULAR | Status: DC | PRN
  Administered 2015-06-18 – 2015-06-19 (×7): 0.5 mg via INTRAVENOUS
  Filled 2015-06-18 (×7): qty 1

## 2015-06-18 MED ORDER — OXYCODONE HCL 5 MG PO TABS
15.0000 mg | ORAL_TABLET | ORAL | Status: DC | PRN
  Administered 2015-06-18 – 2015-06-19 (×8): 15 mg via ORAL
  Filled 2015-06-18 (×8): qty 3

## 2015-06-18 NOTE — Progress Notes (Signed)
Informed by security that patient was caught off the floor smoking near short stay area. MD notified and made aware. Informed patient of hospital policy regarding smoking on campus. Pt does have nicotine patch.

## 2015-06-18 NOTE — Progress Notes (Signed)
[name]  MRN: [id number]  DOB/AGE: 1975/12/17 39 y.o.  Primary Care Physician:ADVANI, Ayesha Rumpf, MD  Admit date: 06/11/2015  Chief Complaint:  Chief Complaint  Patient presents with  . multiple complaints     S-Pt presented on  06/11/2015 with  Chief Complaint  Patient presents with  . multiple complaints   .    Pt today feels better. Pt offers no c/o chest pain/dsypnea  Meds . anidulafungin  100 mg Intravenous Q24H  . DAPTOmycin (CUBICIN)  IV  6 mg/kg Intravenous Q24H  . docusate sodium  100 mg Oral BID  . gabapentin  300 mg Oral TID  . nicotine  21 mg Transdermal Daily  . polyethylene glycol  17 g Oral BID  . potassium chloride  30 mEq Oral BID  . senna  1 tablet Oral QHS  . sodium chloride  3 mL Intravenous Q12H  . sodium chloride  3 mL Intravenous Q12H       Physical Exam: Vital signs in last 24 hours: Temp:  [98.1 F (36.7 C)] 98.1 F (36.7 C) (07/26 1425) Pulse Rate:  [88-92] 92 (07/26 2102) Resp:  [18-20] 20 (07/26 2102) BP: (114-140)/(71-85) 114/71 mmHg (07/26 2102) SpO2:  [96 %-100 %] 96 % (07/26 2102) Weight change:  Last BM Date: 06/17/15  Intake/Output from previous day: 07/26 0701 - 07/27 0700 In: 3955 [P.O.:1080; I.V.:2875] Out: 1400 [Urine:1400]     Physical Exam: General- pt is awake,alert, oriented to time place and person Resp- No acute REsp distress, CTA B/L NO Rhonchi CVS- S1S2 regular in rate and rhythm GIT- BS+, soft, NT, ND EXT- NO LE Edema, Cyanosis   Lab Results: CBC  Recent Labs  06/17/15 1101  WBC 13.6*  HGB 10.4*  HCT 32.7*  PLT 404*    BMET  Recent Labs  06/16/15 0905 06/17/15 1101  NA 138 138  K 3.3* 3.6  CL 100* 99*  CO2 27 28  GLUCOSE 195* 121*  BUN 19 20  CREATININE 1.64* 1.73*  CALCIUM 8.9 9.2   Creat 2016 0.7=> 1.6--1.7  MICRO Recent Results (from the past 240 hour(s))  Urine culture     Status: None   Collection Time: 06/09/15  7:47 PM  Result Value Ref Range Status   Specimen  Description URINE, CLEAN CATCH  Final   Special Requests NONE  Final   Culture NO GROWTH 1 DAY  Final   Report Status 06/11/2015 FINAL  Final  Culture, blood (routine x 2)     Status: None   Collection Time: 06/11/15  3:45 AM  Result Value Ref Range Status   Specimen Description BLOOD LEFT ANTECUBITAL DRAWN BY RN  Final   Special Requests   Final    BOTTLES DRAWN AEROBIC AND ANAEROBIC AEB=8CC ANA=10CC   Culture  Setup Time   Final    YEAST RECOVERED FROM THE AEROBIC BOTTLE Gram Stain Report Called to,Read Back By and Verified With: BULLINS,M. AT 0945 ON 06/12/2015 BY RESSEGGER,R. Performed at [facility]    Culture NO GROWTH 5 DAYS  Final   Report Status 06/16/2015 FINAL  Final  Culture, blood (routine x 2)     Status: None   Collection Time: 06/11/15  3:55 AM  Result Value Ref Range Status   Specimen Description BLOOD PICC LINE DRAWN BY RN TT  Final   Special Requests BOTTLES DRAWN AEROBIC ONLY 8CC  Final   Culture  Setup Time   Final    YEAST RECOVERED FORM THE AEROBIC  BOTTLE Gram Stain Report Called to,Read Back By and Verified With: BULLINS,M. AT 0945 ON 06/12/2015 BY RESSEGGER,R.    Culture   Final    CANDIDA PARAPSILOSIS Performed at [facility]    Report Status 06/15/2015 FINAL  Final  Culture, routine-abscess     Status: None   Collection Time: 06/12/15  4:10 PM  Result Value Ref Range Status   Specimen Description ABSCESS  Final   Special Requests PSOAS LEFT MUSCLE  Final   Gram Stain   Final    MODERATE WBC PRESENT,BOTH PMN AND MONONUCLEAR NO SQUAMOUS EPITHELIAL CELLS SEEN NO ORGANISMS SEEN Performed at [facility]    Culture   Final    NO GROWTH 3 DAYS Performed at [facility]    Report Status 06/16/2015 FINAL  Final  Culture, blood (routine x 2)     Status: None (Preliminary result)   Collection Time: 06/15/15  2:36 PM  Result Value Ref Range Status   Specimen Description BLOOD LEFT ARM  Final   Special Requests BOTTLES  DRAWN AEROBIC AND ANAEROBIC 8CC EACH  Final   Culture NO GROWTH 2 DAYS  Final   Report Status PENDING  Incomplete  Culture, blood (routine x 2)     Status: None (Preliminary result)   Collection Time: 06/15/15  2:50 PM  Result Value Ref Range Status   Specimen Description BLOOD RIGHT ARM  Final   Special Requests   Final    BOTTLES DRAWN AEROBIC AND ANAEROBIC AEB=8CC ANA=6CC   Culture NO GROWTH 2 DAYS  Final   Report Status PENDING  Incomplete      Lab Results  Component Value Date   CALCIUM 9.2 06/17/2015               Impression: 1)Renal  AKI secondary to ATN                AKI sec to Vanco                AKI now stable                2)CVS- hemodynamically stable  3)Anemia HGb stabl  4)ID- admitted with Sepsis sec to Candida and MRSA On antifungal meds and Dapto   5)Electrolytes Normokalemic now        hypokalemia earlier   NOrmonatremic   6)Acid base Co2 at goal     Plan:  will continue current care      Zael Shuman S 06/18/2015, 8:28 AM

## 2015-06-18 NOTE — Progress Notes (Signed)
Pt informed me that he fell in his room. Pt stated that he was reaching/fixing his meal tray and slipped and fell to his knee. Pt denied injuries and refused vital signs or physical examination. Pt insisted he was fine. Informed patient of the need for bed alarms and reeducated patient on requesting assistance and use of call button. Pt noncompliant with safety interventions. Will continue to monitor patient throughout the shift. MD notified and made aware.

## 2015-06-18 NOTE — Progress Notes (Signed)
Patient refused vital signs, wt and blood draw this morning, will reattempt at a later time.

## 2015-06-18 NOTE — Progress Notes (Signed)
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 Remains afebrile. Continue Eraxis per ID. Day #6. Will need 2 weeks per ID. TEE negative. PICC tomorrow if repeat  blood culture results are negative.   AKI. Creatinine trending up. Urine output good. Suspect vancomycin induced, stopped 06/12/15. Appreciate nephrology assistance. Lasix discontinued per nephrology.   Multiple paraspinal abscesses (MRSA), psoas abscess, septic arthritis SI joint. S/p aspiration (drain not recommended by IR) 7/21. Will continue daptomycin day #7. Continues with persistent back pain. Will shorten time interval for oral analgesia and decrease dose of IV analgesia. Educated patient to need to manage pain with oral meds in anticipation of discharge.  Hematuria. Much improved. Etiology unclear. Present 05/15/2015 prior to vanc. Hgb stable. Outpatient f/u with urology planned for cystoscopy.  Normocytic anemia. Trending upward. Suspect subacute disease and hematuria.  Elevated Alk phos, present since 6/23, likely secondary to osteomyelitis  Hypokalemia: likley related to Lasix. Will replete and recheck   Leukocytosis: WBC within limits of normal. Afebrile. Antibiotics as noted. Will monitor closely. If no improvement consider repeat CT/MRI to assess for worsening abscess as well as repeat urinalysis, chest xray   Code Status: full Family Communication: wife at bedside Disposition Plan: home hopefully tomorrow   Consultants:  Urology  Nephrology  ID on phone  IR   ortho  Procedures:  Aspiration of abscess  TEE  Antibiotics:  Vancomycin 06/10/15>>06/12/15  levaquin 7/19-7/20  Zosyn 06/11/15>>06/12/15  Daptomycin 06/12/15>>  Eraxis 06/13/15>>>  HPI/Subjective: Sitting on side of bed. Reports continued pain in low back. Concern about permanent "nerve  damage"  Objective: Filed Vitals:   06/17/15 2102  BP: 114/71  Pulse: 92  Resp: 20    Intake/Output Summary (Last 24 hours) at 06/18/15 1342 Last data filed at 06/18/15 1227  Gross per 24 hour  Intake   3958 ml  Output    400 ml  Net   3558 ml   Filed Weights   06/14/15 1900 06/15/15 0500 06/17/15 0625  Weight: 83.326 kg (183 lb 11.2 oz) 82.056 kg (180 lb 14.4 oz) 81.829 kg (180 lb 6.4 oz)    Exam:   General:  Ambulates in hall with steady gait  Cardiovascular: RRR no MGR no LE edema  Respiratory: normal effort BS distant but clear no wheeze  Abdomen: soft +BS non-distended non-tender  Musculoskeletal: no clubbing or cyanosis   Data Reviewed: Basic Metabolic Panel:  Recent Labs Lab 06/14/15 0950 06/15/15 0630 06/16/15 0901 06/16/15 0905 06/17/15 1101 06/18/15 1010  NA 140 142  --  138 138 141  K 3.3* 3.4*  --  3.3* 3.6 4.1  CL 105 104  --  100* 99* 102  CO2 26 29  --  $R'27 28 29  'mm$ GLUCOSE 142* 107*  --  195* 121* 97  BUN 12 19  --  19 20 21*  CREATININE 1.64* 1.68*  --  1.64* 1.73* 1.95*  CALCIUM 8.5* 8.4*  --  8.9 9.2 9.2  MG  --   --  1.9  --   --   --    Liver Function Tests:  Recent Labs Lab 06/12/15 0708  AST 22  ALT 17  ALKPHOS 242*  BILITOT 0.3  PROT 6.2*  ALBUMIN 2.4*   No results for input(s): LIPASE, AMYLASE in the last 168 hours. No results for input(s): AMMONIA in the last 168 hours. CBC:  Recent Labs Lab  06/12/15 0708 06/13/15 0623 06/15/15 0630 06/17/15 1101 06/18/15 1010  WBC 6.6 9.1 8.7 13.6* 8.6  HGB 7.6* 8.0* 9.6* 10.4* 10.1*  HCT 23.3* 25.2* 29.4* 32.7* 32.2*  MCV 83.2 82.9 81.7 82.8 84.1  PLT 165 262 317 404* 359   Cardiac Enzymes:  Recent Labs Lab 06/14/15 0950 06/16/15 0905  CKTOTAL 18* 16*   BNP (last 3 results) No results for input(s): BNP in the last 8760 hours.  ProBNP (last 3 results) No results for input(s): PROBNP in the last 8760 hours.  CBG: No results for input(s): GLUCAP in the last 168  hours.  Recent Results (from the past 240 hour(s))  Urine culture     Status: None   Collection Time: 06/09/15  7:47 PM  Result Value Ref Range Status   Specimen Description URINE, CLEAN CATCH  Final   Special Requests NONE  Final   Culture NO GROWTH 1 DAY  Final   Report Status 06/11/2015 FINAL  Final  Culture, blood (routine x 2)     Status: None   Collection Time: 06/11/15  3:45 AM  Result Value Ref Range Status   Specimen Description BLOOD LEFT ANTECUBITAL DRAWN BY RN  Final   Special Requests   Final    BOTTLES DRAWN AEROBIC AND ANAEROBIC AEB=8CC ANA=10CC   Culture  Setup Time   Final    YEAST RECOVERED FROM THE AEROBIC BOTTLE Gram Stain Report Called to,Read Back By and Verified With: BULLINS,M. AT 0945 ON 06/12/2015 BY RESSEGGER,R. Performed at [facility]    Culture NO GROWTH 5 DAYS  Final   Report Status 06/16/2015 FINAL  Final  Culture, blood (routine x 2)     Status: None   Collection Time: 06/11/15  3:55 AM  Result Value Ref Range Status   Specimen Description BLOOD PICC LINE DRAWN BY RN TT  Final   Special Requests BOTTLES DRAWN AEROBIC ONLY 8CC  Final   Culture  Setup Time   Final    YEAST RECOVERED FORM THE AEROBIC BOTTLE Gram Stain Report Called to,Read Back By and Verified With: BULLINS,M. AT 0945 ON 06/12/2015 BY RESSEGGER,R.    Culture   Final    CANDIDA PARAPSILOSIS Performed at [facility]    Report Status 06/15/2015 FINAL  Final  Culture, routine-abscess     Status: None   Collection Time: 06/12/15  4:10 PM  Result Value Ref Range Status   Specimen Description ABSCESS  Final   Special Requests PSOAS LEFT MUSCLE  Final   Gram Stain   Final    MODERATE WBC PRESENT,BOTH PMN AND MONONUCLEAR NO SQUAMOUS EPITHELIAL CELLS SEEN NO ORGANISMS SEEN Performed at [facility]    Culture   Final    NO GROWTH 3 DAYS Performed at [facility]    Report Status 06/16/2015 FINAL  Final  Culture, blood (routine x 2)     Status:  None (Preliminary result)   Collection Time: 06/15/15  2:36 PM  Result Value Ref Range Status   Specimen Description BLOOD LEFT ARM  Final   Special Requests BOTTLES DRAWN AEROBIC AND ANAEROBIC 8CC EACH  Final   Culture NO GROWTH 3 DAYS  Final   Report Status PENDING  Incomplete  Culture, blood (routine x 2)     Status: None (Preliminary result)   Collection Time: 06/15/15  2:50 PM  Result Value Ref Range Status   Specimen Description BLOOD RIGHT ARM  Final   Special Requests   Final  BOTTLES DRAWN AEROBIC AND ANAEROBIC AEB=8CC ANA=6CC   Culture NO GROWTH 3 DAYS  Final   Report Status PENDING  Incomplete     Studies: No results found.  Scheduled Meds: . anidulafungin  100 mg Intravenous Q24H  . DAPTOmycin (CUBICIN)  IV  6 mg/kg Intravenous Q24H  . docusate sodium  100 mg Oral BID  . gabapentin  300 mg Oral TID  . nicotine  21 mg Transdermal Daily  . polyethylene glycol  17 g Oral BID  . potassium chloride  30 mEq Oral BID  . senna  1 tablet Oral QHS  . sodium chloride  3 mL Intravenous Q12H  . sodium chloride  3 mL Intravenous Q12H   Continuous Infusions: . sodium chloride 1,000 mL (06/18/15 1215)    Principal Problem:   Candidemia Active Problems:   Iliopsoas abscess   MRSA bacteremia   Acute kidney injury (nontraumatic)   Fever   Anemia   Acute renal failure   Hypokalemia   H/O back injury   Intractable pain   Hematuria   Pyrexia    Time spent: 30 minutes    [facility] Pager [phone]. If 7PM-7AM, please contact night-coverage at [email or web address], password [facility] 06/18/2015, 1:42 PM  LOS: 7 days

## 2015-06-19 LAB — BASIC METABOLIC PANEL
Anion gap: 8 (ref 5–15)
BUN: 19 mg/dL (ref 6–20)
CHLORIDE: 103 mmol/L (ref 101–111)
CO2: 29 mmol/L (ref 22–32)
Calcium: 8.9 mg/dL (ref 8.9–10.3)
Creatinine, Ser: 1.65 mg/dL — ABNORMAL HIGH (ref 0.61–1.24)
GFR calc Af Amer: >60 mL/min (ref >60)
GFR calc non Af Amer: 52 mL/min — ABNORMAL LOW (ref >60)
GLUCOSE: 130 mg/dL — AB (ref 65–99)
Potassium: 3.9 mmol/L (ref 3.5–5.1)
Sodium: 140 mmol/L (ref 135–145)

## 2015-06-19 MED ORDER — OXYCODONE HCL 10 MG PO TABS: 10.0000 mg | ORAL_TABLET | ORAL | Status: DC | PRN

## 2015-06-19 MED ORDER — GABAPENTIN 300 MG PO CAPS: 300.0000 mg | ORAL_CAPSULE | Freq: Three times a day (TID) | ORAL | Status: DC

## 2015-06-19 MED ORDER — SODIUM CHLORIDE 0.9 % IV SOLN: INTRAVENOUS | Status: DC

## 2015-06-19 MED ORDER — NICOTINE 21 MG/24HR TD PT24: 21.0000 mg | MEDICATED_PATCH | Freq: Every day | TRANSDERMAL | Status: DC

## 2015-06-19 MED ORDER — SODIUM CHLORIDE 0.9 % IV SOLN: 6.0000 mg/kg | INTRAVENOUS | Status: DC

## 2015-06-19 MED ORDER — METHOCARBAMOL 500 MG PO TABS: 500.0000 mg | ORAL_TABLET | Freq: Three times a day (TID) | ORAL | Status: DC | PRN

## 2015-06-19 NOTE — Progress Notes (Signed)
Subjective: Patient is feeling a little bit better. Still complains of pain. He doesn't have any nausea vomiting. Objective: Vital signs in last 24 hours: Temp:  [98.1 F (36.7 C)-98.9 F (37.2 C)] 98.6 F (37 C) (07/28 0406) Pulse Rate:  [79-94] 79 (07/28 0406) Resp:  [16-19] 19 (07/28 0406) BP: (119-130)/(63-90) 129/82 mmHg (07/28 0406) SpO2:  [100 %] 100 % (07/28 0406) Weight:  [188 lb 6.4 oz (85.458 kg)] 188 lb 6.4 oz (85.458 kg) (07/28 0404)  Intake/Output from previous day: 07/27 0701 - 07/28 0700 In: 2413.8 [P.O.:1200; I.V.:1105.1; IV Piggyback:108.7] Out: -  Intake/Output this shift: Total I/O In: 120 [P.O.:120] Out: -    Recent Labs  06/17/15 1101 06/18/15 1010  HGB 10.4* 10.1*    Recent Labs  06/17/15 1101 06/18/15 1010  WBC 13.6* 8.6  RBC 3.95* 3.83*  HCT 32.7* 32.2*  PLT 404* 359    Recent Labs  06/18/15 1010 06/19/15 0950  NA 141 140  K 4.1 3.9  CL 102 103  CO2 29 29  BUN 21* 19  CREATININE 1.95* 1.65*  GLUCOSE 97 130*  CALCIUM 9.2 8.9   No results for input(s): LABPT, INR in the last 72 hours.   generally patient is alert and in no apparent distress. Chest is clear to auscultation His heart exam reveals regular rate and rhythm no murmur Extremities no edema   Assessment/Plan: Problem #1 acute kidney injury: His renal function started improving. Since patient is not saving his urine very difficult to know what's his urine output is. However patient does not have any sign of fluid overload. Problem #2 anemia: His hemoglobin is better and stable. Problem #3 iliopsoas abscess: Presently is on antibiotics. Patient is afebrile and his white blood cell count is normal. Problem #4 hematuria: Presently seems to be persistent. ASO titer, hepatitis B surface antigen, hepatitis C antibody and ANA is negative. Patient was  normal complement. Possible IgA nephropathy/Alport syndrome/if urological workup with negative Problem #5 MRSA bacteremia:  Presently he is on Cubicin Problem #6 hypokalemia: Potassium supplement and some potassium is normal.  Problem #7 Candida bacterimia     Being managed by hospitalist Plan: 1] we'll continue his present management 2] we'll check his basic metabolic panel in the morning.   [name] S 06/19/2015, 10:29 AM

## 2015-06-19 NOTE — Care Management Note (Signed)
Case Management Note  Patient Details  Name: [name] MRN: [id number] Date of Birth: July 06, 1976  Subjective/Objective:                    Action/Plan:   Expected Discharge Date:                  Expected Discharge Plan:  Home/Self Care  In-House Referral:  Financial Counselor  Discharge planning Services  CM Consult  Post Acute Care Choice:  NA Choice offered to:  NA  DME Arranged:    DME Agency:     HH Arranged:    HH Agency:     Status of Service:  Completed, signed off  Medicare Important Message Given:    Date Medicare IM Given:    Medicare IM give by:    Date Additional Medicare IM Given:    Additional Medicare Important Message give by:     If discussed at Long Length of Stay Meetings, dates discussed:    Additional Comments: Pt discharged home today and will return to [facility] on 06/20/15 for new PICC line and IV AB. Pt made aware that he will need to come to hospital daily for IV AB. [facility] unable to provide new IV AB at discharge due to unavailability. This CM did inquire from other home health agencies [facility] [place], [place], and [facility]) and an infusion company, [facility], and they are unable to provide any assistance with IV AB. Pt is aware that there are no other options until Medicaid becomes active. Orders faxed to [facility] out[facility] infusion center for IV AB and PICC line. No other CM needs noted. Follow up appts will be made prior to discharge. Pt and pts nurse aware of discharge arrangements. Arlyss Queen [place], RN 06/19/2015, 2:12 PM

## 2015-06-19 NOTE — Discharge Summary (Signed)
Physician Discharge Summary  RUBEN MAHLER TDS:[id number] DOB: August 31, 1976 DOA: 06/11/2015  PCP: Lorayne Marek, MD  Admit date: 06/11/2015 Discharge date: 06/19/2015  Time spent: 40 minutes  Recommendations for Outpatient Follow-up:  1. Follow up with Dr Hinda Lenis 1-2 weeks for evaluation of kidney function 1. Follow up with [facility] 2-3 weeks for evaluation of persistent hematuria 2. Follow up with Infectious disease at [facility] 4 weeks 3. Appear daily at clinic at [place] for IV antibiotics until 07/08/2015  Discharge Diagnoses:  Principal Problem:   Candidemia Active Problems:   Iliopsoas abscess   MRSA bacteremia   Acute kidney injury (nontraumatic)   Fever   Anemia   Acute renal failure   Hypokalemia   H/O back injury   Intractable pain   Hematuria   Pyrexia   Discharge Condition: stable  Diet recommendation: regular  Filed Weights   06/15/15 0500 06/17/15 0625 06/19/15 0404  Weight: 82.056 kg (180 lb 14.4 oz) 81.829 kg (180 lb 6.4 oz) 85.458 kg (188 lb 6.4 oz)    History of present illness:  39 yo male with recent admission 05/15/2015 for iliopsoas abscess c/o sciatica in bilateral legs. Pt came to ER on 06/11/15 for evaluation worsening pain . Pt had low grade temp in ED, along with mild ARF, and hypokalemia. And anemia. + dry cough. CXR showed ? Atelectasis vs pneumonia.   Hospital Course:   Candida parapsilosis bacteremia 2/2 bottles. Former PICC removed.  Has remained afebrile several days prior to discharge. Repeat blood cultures no growth for 4 days.  Provided with Eraxis per ID. Will discharge with arrangements for him to come to clinic at [facility] 06/20/15 to have PICC placed and continue eraxis until 06/26/15 to complete 2 weeks per ID. TEE negative. [facility] 4[facility].   AKI. Creatinine trending down at discharge. Urine output good. Suspect vancomycin induced, stopped 06/12/15. evaluated by nephrology. Will follow up with nephrology 1 week for  evaluation of kidney function  Multiple paraspinal abscesses (MRSA), psoas abscess, septic arthritis SI joint. S/p aspiration (drain not recommended by IR) 7/21. Vancomycin changed to daptomycin 7/22 due to #2. Will need to continue daily OP last dose 07/08/2015 to complete 4 week course.  Continued with persistent back pain. Required  IV analgesia every 2 hours. Last few days of hospitalization pain medicine adjusted in anticipation of discharge. IV dilaudid decreased, oral increased slightly. Patient ambulated in hall without problem, ambulated outside to smoke without problem. On day of discharge appeared slightly lethargic with slow speech. IV diluadid discontinued. At discharge alert oriented. Gait steady. .  Hematuria. Much improved during course of hospitalization. Etiology unclear. Present 05/15/2015 prior to vanc. Hgb remained stable. Outpatient f/u with urology planned for cystoscopy.  Normocytic anemia. Stable. Suspect subacute disease and hematuria.  Elevated Alk phos, present since 6/23, likely secondary to osteomyelitis  Hypokalemia: likley related to Lasix. repleted and stable at discharge   Leukocytosis: WBC within limits of normal at discharge. Afebrile. Antibiotics as noted.  Compliance: during hospitalization patient non-compliant with policies and safety guidelines i.e. Smoking in room, going outdoors with IV. Nursing note would indicate he refused labs and when confronted denied. Information given to CM inconsistent and unreliable. Decision to place PICC on first day of OP therapy (06/20/15) agreed upon as some concern regarding compliance and patient having PICC and not coming for therapy   Procedures:  Aspiration of abscess  TEE  Consultations:  Vancomycin 06/10/15>>06/12/15  levaquin 7/19-7/20  Zosyn 06/11/15>>06/12/15  Daptomycin 06/12/15>>  Eraxis 06/13/15>>> Discharge Exam: Filed Vitals:   06/19/15 0406  BP: 129/82  Pulse: 79  Temp: 98.6 F (37 C)  Resp: 19     General: well nourished appears comfortable Cardiovascular: RRR No MGR No LE edema Respiratory: normal effort BS clear no wheeze  Discharge Instructions   Discharge Instructions    Diet - low sodium heart healthy    Complete by:  As directed      Discharge instructions    Complete by:  As directed   Come to [facility] daily for IV antibiotics until 07/11/2015 Follow up with Dr Hinda Lenis for kidney function 1-2 weeks Follow up Dr Johnnye Sima Infectious disease at [facility] 4-5 weeks for bacteremia Follow up with [facility] 3-4 weeks work up hematuria     Increase activity slowly    Complete by:  As directed           Current Discharge Medication List    START taking these medications   Details  anidulafungin in sodium chloride 0.9 % 100 mL Daily IV last dose 06/26/15.    DAPTOmycin in sodium chloride 0.9 % 100 mL Daily IV last dose 07/08/15. Weigh daily and pharmacy to adjust daily based on weight.    nicotine (NICODERM CQ - DOSED IN MG/24 HOURS) 21 mg/24hr patch Place 1 patch (21 mg total) onto the skin daily. Qty: 28 patch, Refills: 0      CONTINUE these medications which have CHANGED   Details  gabapentin (NEURONTIN) 300 MG capsule Take 1 capsule (300 mg total) by mouth 3 (three) times daily. Qty: 90 capsule, Refills: 3   Associated Diagnoses: Pain; Chronic low back pain    methocarbamol (ROBAXIN) 500 MG tablet Take 1 tablet (500 mg total) by mouth every 8 (eight) hours as needed for muscle spasms. Qty: 30 tablet, Refills: 0    Oxycodone HCl 10 MG TABS Take 1 tablet (10 mg total) by mouth every 4 (four) hours as needed. Qty: 24 tablet, Refills: 0      CONTINUE these medications which have NOT CHANGED   Details  diphenhydramine-acetaminophen (TYLENOL PM) 25-500 MG TABS Take 1 tablet by mouth at bedtime as needed (SLEEP).     polyethylene glycol (MIRALAX / GLYCOLAX) packet Take 17 g by mouth 2 (two) times daily. May change to as needed once with regular bowel  movements. Qty: 60 each, Refills: 0    traMADol (ULTRAM) 50 MG tablet Take 2 tablets (100 mg total) by mouth every 6 (six) hours. Qty: 60 tablet, Refills: 0      STOP taking these medications     vancomycin 1,500 mg in sodium chloride 0.9 % 500 mL      acetaminophen-codeine (TYLENOL #3) 300-30 MG per tablet      amoxicillin-clavulanate (AUGMENTIN) 875-125 MG per tablet      docusate sodium (COLACE) 100 MG capsule      doxycycline (VIBRAMYCIN) 100 MG capsule      metroNIDAZOLE (FLAGYL) 500 MG tablet      senna (SENOKOT) 8.6 MG TABS tablet        Allergies  Allergen Reactions  . Vicodin [Hydrocodone-Acetaminophen] Hives and Itching  . Adhesive [Tape] Rash   Follow-up Information    Follow up with [facility], MD On 07/14/2015.   Specialty:  Nephrology   Why:  FOLLOW-UP APPT WITH DR.BEFEKADU AT 1200 ON AUG 22,2016   Contact information:   [address] [place] [address] [phone]  Follow up with [facility]. Schedule an appointment as soon as possible for a visit on 07/08/2015.   Why:  follow up hematuria and may need cystoscopy on aug 16,2016 @ 10:45   Contact information:   [address] [place] [id number] [phone]      Follow up with Bobby Rumpf, MD. Schedule an appointment as soon as possible for a visit in 4 weeks.   Specialty:  Infectious Diseases   Why:  follow up to bacteremia   Contact information:   [employer] [place] [address] [phone]        The results of significant diagnostics from this hospitalization (including imaging, microbiology, ancillary and laboratory) are listed below for reference.    Significant Diagnostic Studies: Mr Lumbar Spine W Wo Contrast  06/11/2015   CLINICAL DATA:  Fever. Continued low back pain. History of abscess drainage.  EXAM: MRI LUMBAR SPINE WITHOUT AND WITH CONTRAST  TECHNIQUE: Multiplanar and multiecho pulse sequences  of the lumbar spine were obtained without and with intravenous contrast.  CONTRAST:  42mL MULTIHANCE GADOBENATE DIMEGLUMINE 529 MG/ML IV SOLN  COMPARISON:  06/02/2015 lumbar spine MRI. 06/09/2015 CT abdomen and pelvis.  FINDINGS: Vertebral alignment is unchanged with slight retrolisthesis of L5 on S1. Vertebral body heights are preserved. Disc desiccation and mild disc space narrowing are present at L4-5 and L5-S1, similar to prior. No vertebral marrow edema is seen in the lumbar spine. Conus medullaris is normal in signal and terminates at L1.  T11-12: Only imaged sagittally. Mild disc bulging results in at most mild spinal canal and neural foraminal narrowing, unchanged.  T12-L1:  Only imaged sagittally.  No disc herniation or stenosis.  L1-2 through L3-4:  Negative.  L4-5: Mild circumferential disc bulging, small superimposed central disc protrusion, and mild facet hypertrophy result in minimal spinal canal narrowing and minimal bilateral neural foraminal narrowing, unchanged. Minimal residual dorsal epidural enhancement at the site of prior phlegmon has decreased from prior.  L5-S1: Mild circumferential disc bulging, broad central disc protrusion, and mild facet hypertrophy result in mild right greater than left lateral recess stenosis and mild bilateral neural foraminal stenosis, unchanged. No spinal stenosis.  Marrow edema about the left SI joint involving the left sacral ala and iliac bone does not appear significantly different than on the prior MRI. Erosive changes and fluid are again noted in the left SI joint. Edema is again seen in the soft tissues surrounding the left SI joint including the iliopsoas and gluteal musculature, lower lumbar paraspinal musculature, and other surrounding soft tissues.  Fluid collection at the superior aspect of the left iliacus muscle has enlarged from the prior MRI, now measuring 2.1 x 1.9 cm (previously 1.4 x 0.8 cm). Fluid tracks inferiorly from this collection along the  anterior aspect of the left SI joint, and there is a new 1.2 cm fluid collection just superior to the left pelvic sidewall posterior to the left external iliac vein (series 7, image 49). Extensive surrounding phlegmon is again seen about the left iliopsoas musculature. Trace free fluid is noted in the right mid abdomen near the tip of the liver, grossly stable to minimally increased compared to that on prior CT.  IMPRESSION: 1. Persistent extensive marrow and soft tissue edema about the left SI joint consistent with septic arthritis and surrounding myositis and soft tissue phlegmon. 2. 2.1 cm fluid collection/abscess at the superior aspect of the left iliacus, larger than on prior MRI. 3.  New 1.2 cm collection just superior to the left pelvic sidewall. 4. No evidence of new infection in the lumbar spine. Minimal residual dorsal epidural enhancement at L4-5.   Electronically Signed   By: Logan Bores   On: 06/11/2015 14:01   Mr Lumbar Spine W Wo Contrast  06/02/2015   CLINICAL DATA:  39 year old male with severe lumbar back pain radiating to the left hip and lower extremity. Recent left iliopsoas abscess and pelvic abscess drainage. Subsequent encounter.  EXAM: MRI LUMBAR SPINE WITHOUT AND WITH CONTRAST  TECHNIQUE: Multiplanar and multiecho pulse sequences of the lumbar spine were obtained without and with intravenous contrast.  CONTRAST:  47mL MULTIHANCE GADOBENATE DIMEGLUMINE 529 MG/ML IV SOLN  COMPARISON:  CT Abdomen and Pelvis 05/22/2015. Lumbar MRI 05/15/2015.  FINDINGS: Same numbering system as on 05/15/2015 designating normal lumbar segmentation. Stable lumbar and visualized lower thoracic vertebral height and alignment. No lumbar or lower thoracic marrow edema identified.  T12-L1 disc space loss with circumferential disc bulge appears stable. Mild facet hypertrophy also at that level. Up to mild multifactorial spinal and T11 foraminal stenosis appears stable. No signal abnormality identified in the  adjacent lower thoracic spinal cord. The conus medullaris appears normal at L1.  The L1-L2 through L3-L4 disc spaces remain within normal limits.  Resolved posterior L4-L5 and L5-S1 epidural phlegmon/ inflammation. Improved thecal sac patency at both levels. Underlying L4-L5 and L5-S1 disc and facet degeneration appears stable.  Abnormal T2 and STIR signal surrounding the left SI joint and involving the left sacral ala, medial left iliac bone, an the surrounding anterior and posterior soft tissues including the visible iliopsoas muscle and left gluteal muscles, persists but appears mildly regressed since June. Associated abnormal postcontrast enhancement of these bony and soft tissues. Trace presacral fluid today. Percutaneous appears stable in position. Left iliopsoas drainage catheter re- identified. Minimal fluid surrounding the catheter. 2-3 cm cephalad of the catheter there is a small 14 mm iliopsoas fluid collection (series 5, image 33).  Visualized abdominal soft tissues are within normal limits.  IMPRESSION: 1. Resolved dorsal lower lumbar epidural phlegmon since 05/15/2015. No acute findings in the lumbar spine. 2. 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 3. Percutaneous left iliopsoas drainage catheter in place with minimal fluid adjacent to the catheter, and small 14 mm fluid collection a short distance cephalad to the catheter.   Electronically Signed   By: Genevie Ann M.D.   On: 06/02/2015 18:51   Ct Abdomen Pelvis W Contrast  06/09/2015   CLINICAL DATA:  39 year old male with history of left psoas abscess and drainage presenting with abdominal pain  EXAM: CT ABDOMEN AND PELVIS WITH CONTRAST  TECHNIQUE: Multidetector CT imaging of the abdomen and pelvis was performed using the standard protocol following bolus administration of intravenous contrast.  CONTRAST:  126mL OMNIPAQUE  IOHEXOL 300 MG/ML  SOLN  COMPARISON:  CT dated 05/22/2015 and 06/02/2015  FINDINGS: The visualized lung bases are clear. No intra-abdominal free air or free fluid.  Trace pericholecystic fluid may be present. There is mild periportal edema. The liver is otherwise unremarkable. No gallstone identified. The pancreas appears unremarkable. Splenomegaly measuring up to 16 cm. The adrenal glands, kidneys, visualized ureters and urinary bladder appear unremarkable. The prostate and seminal vesicles are grossly unremarkable.  Moderate stool throughout the colon. No evidence of bowel obstruction or inflammation. Normal appendix.  The visualized abdominal aorta and IVC are  patent. No portal venous gas identified. There is no tendinopathy. Is there is diffuse haziness of the retroperitoneal and periaortic fat planes.  There is inflammatory changes of the left psoas and iliacus muscles. there has been interval removal of the left pelvic wall pigtail drainage catheter. A 1.3 x 1.0 cm hypodense focus noted in the left iliacus muscle (series 201, image 62) may represent residual fluid. There is overall interval improvement of the inflammatory changes of the left pelvic sidewall. No drainable collection/abscess identified.  There is irregularity of the left sacroiliac joint compatible with sacroiliitis which is new compared to the study dated 05/22/2015 and progressed since the study 06/02/2015. There is degenerative changes of the spine. No acute fracture. Mild diffuse subcutaneous soft tissue stranding noted.  IMPRESSION: Interval abdominal of the left pelvic side hole drainage catheter with overall improvement of the inflammatory changes of the left psoas and iliacus muscles. Small stable hypodense focus in the superior aspect of the left iliacus muscle likely represents residual fluid. No drainable fluid collection/ abscess identified.  Irregularity of the left SI joint, with interval progression compared the prior study  concerning for sacroiliitis. Clinical correlation is recommended.   Electronically Signed   By: Anner Crete M.D.   On: 06/09/2015 22:33   Ct Abdomen Pelvis W Contrast  06/02/2015   CLINICAL DATA:  39 year old male with history of iliopsoas abscess, s/p JP drain placement. Pain at site of insertion since 05/30/2015. History of MRSA.  EXAM: CT ABDOMEN AND PELVIS WITH CONTRAST  TECHNIQUE: Multidetector CT imaging of the abdomen and pelvis was performed using the standard protocol following bolus administration of intravenous contrast.  CONTRAST:  16mL OMNIPAQUE IOHEXOL 300 MG/ML SOLN, 163mL OMNIPAQUE IOHEXOL 300 MG/ML SOLN  COMPARISON:  CT of the abdomen and pelvis 05/22/2015.  FINDINGS: Lower chest:  Unremarkable.  Hepatobiliary: No cystic or solid hepatic lesions. No intra or extrahepatic biliary ductal dilatation. Gallbladder is normal in appearance.  Pancreas: No pancreatic mass. No pancreatic ductal dilatation. No pancreatic or peripancreatic fluid or inflammatory changes.  Spleen: Multiple small calcified granulomas in the spleen.  Adrenals/Urinary Tract: Bilateral adrenal glands and bilateral kidneys are normal in appearance. No hydroureteronephrosis or perinephric stranding to indicate urinary tract obstruction at this time. Urinary bladder is normal in appearance.  Stomach/Bowel: Stomach is normal in appearance. No pathologic dilatation of small bowel or colon. Normal appendix.  Vascular/Lymphatic: Atherosclerosis throughout the abdominal and pelvic vasculature, without evidence of aneurysm or dissection. Numerous prominent but nonenlarged retroperitoneal lymph nodes are noted, which are nonspecific, likely reactive.  Reproductive: Prostate gland and seminal vesicles are unremarkable in appearance.  Other: Again noted are 2 pigtail drainage catheters in place in the left iliopsoas musculature, and along the left pelvic sidewall. Previously noted fluid collections in these regions appear to have  completely resolved. There is a tiny focus of low attenuation in the superior aspect of the left iliacus muscle (image 45 of series 7), which measures approximately 8 mm in greatest length, slightly smaller than the prior examination, which could represent an additional small abscess, or may represent an intramuscular seroma or resolving chronic hematoma. No significant volume of ascites. No pneumoperitoneum.  Musculoskeletal: There are no aggressive appearing lytic or blastic lesions noted in the visualized portions of the skeleton.  IMPRESSION: 1. Left-sided pigtail drainage catheters remain in position in the left iliopsoas musculature and along the left pelvic sidewall, with essentially complete resolution of previously noted abscesses. Separate from the drained collections there is an 8  mm focus in the cephalad aspect of the left iliacus musculature immediately lateral to the superior aspect of the left sacroiliac joint, which may represent an additional small abscess (if so, this is smaller than the prior study), or a resolving intramuscular seroma or chronic hematoma. 2. Additional incidental findings, as above.   Electronically Signed   By: Vinnie Langton M.D.   On: 06/02/2015 19:12   Ct Abdomen Pelvis W Contrast  05/22/2015   CLINICAL DATA:  39 year old male with left iliopsoas abscess.  EXAM: CT ABDOMEN AND PELVIS WITH CONTRAST  TECHNIQUE: Multidetector CT imaging of the abdomen and pelvis was performed using the standard protocol following bolus administration of intravenous contrast.  CONTRAST:  175mL OMNIPAQUE IOHEXOL 300 MG/ML  SOLN  COMPARISON:  CT dated 05/16/2015  FINDINGS: Evaluation of this exam is limited due to streak artifact caused by patient's arms.  Lower chest:  Unremarkable.  Peritoneum: No free air or free fluid.  Liver: Unremarkable.No intrahepatic biliary ductal dilatation.  Gallbladder: Unremarkable. No calcified gallstone. No pericholecystic fluid.  Pancreas: Unremarkable.No  ductal dilatation.  Spleen: Unremarkable.  Adrenals glands: Unremarkable.  Kidneys, ureters, urinary bladder: No hydronephrosis.The urinary bladder is grossly unremarkable.  Reproductive: Unremarkable.  Bowel and appendix: Constipation. No bowel obstruction. Multiple normal caliber fecalized loops of small bowel noted suggestive of chronic stasis.The appendix is unremarkable.  Vascular/Lymphatic: Unremarkable.No lymphadenopathy.  Abdominal wall/Musculoskeletal: There has been significant interval decrease in the size of the left pelvic sidewall collections. There are two percutaneous pigtail drainage catheters, one positioned between the left psoas and iliacus muscle and the upper in the lower pelvic sidewall. There is focal heterogeneous attenuating tissue along the left psoas muscle at the level of the left SI joint in the region of the previous abscess likely representing residual fluid or phlegmon. No drainable fluid collection identified adjacent to the tips of the drainage catheters.  IMPRESSION: Decrease in the size of the previously seen pelvic side wall fluid collections/abscesses. Small residual phlegmon/abscess is present in the location the superior collection. No drainable fluid identified in the region of the previously seen inferior location within the pelvis.   Electronically Signed   By: Anner Crete M.D.   On: 05/22/2015 19:28   US Renal  06/12/2015   CLINICAL DATA:  39 year old male with hematuria and acute renal failure  EXAM: RENAL / URINARY TRACT ULTRASOUND COMPLETE  COMPARISON:  CT scan abdomen/pelvis 06/09/2015 ; MRI lumbar spine 06/11/2015  FINDINGS: Right Kidney:  Length: 14.5 cm. Echogenic renal parenchyma with increased conspicuity of the corticomedullary junctions. No mass or hydronephrosis visualized.  Left Kidney:  Length: 14.2 cm. Echogenic renal parenchyma with increased conspicuity of the corticomedullary junctions. No mass or hydronephrosis visualized.  Bladder:  Appears  normal for degree of bladder distention. Both ureteral jets are visualized.  IMPRESSION: 1. No evidence of hydronephrosis. 2. Echogenic renal parenchyma bilaterally suggests underlying medical renal disease.   Electronically Signed   By: Jacqulynn Cadet M.D.   On: 06/12/2015 10:16   Ct Aspiration  06/13/2015   CLINICAL DATA:  History of left psoas abscess, post catheter drainage. Septic left sacroiliac joint arthritis with myositis and phlegmon in the left iliopsoas region, and a small central 2 cm fluid component on recent MR.  EXAM: CT GUIDED ASPIRATION BIOPSY OF LEFT ILIOPSOAS ABSCESS  ANESTHESIA/SEDATION: Intravenous Fentanyl and Versed were administered as conscious sedation during continuous cardiorespiratory monitoring by the radiology RN, with a total moderate sedation time of 14 minutes.  PROCEDURE: The procedure risks,  benefits, and alternatives were explained to the patient. Questions regarding the procedure were encouraged and answered. The patient understands and consents to the procedure.  Select axial scans through the pelvis were obtained. The fluid collection seen on MR was localized and an appropriate skin entry site was determined and marked.  The operative field was prepped with Betadinein a sterile fashion, and a sterile drape was applied covering the operative field. A sterile gown and sterile gloves were used for the procedure. Local anesthesia was provided with 1% Lidocaine.  Under CT fluoroscopic guidance, an 18 gauge trocar needle was advanced into the collection. Approximately 5 mL of cloudy yellow fluid were aspirated, sent for Gram stain, culture and sensitivity. Postprocedure scans show no hemorrhage or other apparent complication. The patient tolerated the procedure well.  COMPLICATIONS: None immediate  FINDINGS: Extensive phlegmonous change in the left iliopsoas region. The fluid collection seen on MR is identified, and 5 mL thin cloudy fluid aspirated under CT guidance. Sample  sent for Gram stain, culture and sensitivity.  IMPRESSION: 1. Technically successful aspiration of small left iliopsoas abscess under CT guidance.   Electronically Signed   By: Lucrezia Europe M.D.   On: 06/13/2015 11:03   Dg Chest Port 1 View  06/11/2015   CLINICAL DATA:  Acute onset of nausea, upper back pain and headache. Initial encounter.  EXAM: PORTABLE CHEST - 1 VIEW  COMPARISON:  Chest radiograph performed 05/15/2015  FINDINGS: The lungs are well-aerated. Mild bibasilar opacities may reflect atelectasis or possibly mild pneumonia. There is no evidence of pleural effusion or pneumothorax.  The cardiomediastinal silhouette is within normal limits. No acute osseous abnormalities are seen. A left PICC is noted ending about the cavoatrial junction.  IMPRESSION: Mild bibasilar airspace opacities may reflect atelectasis or possibly mild pneumonia.   Electronically Signed   By: Garald Balding M.D.   On: 06/11/2015 04:50    Microbiology: Recent Results (from the past 240 hour(s))  Urine culture     Status: None   Collection Time: 06/09/15  7:47 PM  Result Value Ref Range Status   Specimen Description URINE, CLEAN CATCH  Final   Special Requests NONE  Final   Culture NO GROWTH 1 DAY  Final   Report Status 06/11/2015 FINAL  Final  Culture, blood (routine x 2)     Status: None   Collection Time: 06/11/15  3:45 AM  Result Value Ref Range Status   Specimen Description BLOOD LEFT ANTECUBITAL DRAWN BY RN  Final   Special Requests   Final    BOTTLES DRAWN AEROBIC AND ANAEROBIC AEB=8CC ANA=10CC   Culture  Setup Time   Final    YEAST RECOVERED FROM THE AEROBIC BOTTLE Gram Stain Report Called to,Read Back By and Verified With: BULLINS,M. AT 0945 ON 06/12/2015 BY RESSEGGER,R. Performed at [facility]    Culture NO GROWTH 5 DAYS  Final   Report Status 06/16/2015 FINAL  Final  Culture, blood (routine x 2)     Status: None   Collection Time: 06/11/15  3:55 AM  Result Value Ref Range Status    Specimen Description BLOOD PICC LINE DRAWN BY RN TT  Final   Special Requests BOTTLES DRAWN AEROBIC ONLY 8CC  Final   Culture  Setup Time   Final    YEAST RECOVERED FORM THE AEROBIC BOTTLE Gram Stain Report Called to,Read Back By and Verified With: BULLINS,M. AT 0945 ON 06/12/2015 BY RESSEGGER,R.    Culture   Final  CANDIDA PARAPSILOSIS Performed at [facility]    Report Status 06/15/2015 FINAL  Final  Culture, routine-abscess     Status: None   Collection Time: 06/12/15  4:10 PM  Result Value Ref Range Status   Specimen Description ABSCESS  Final   Special Requests PSOAS LEFT MUSCLE  Final   Gram Stain   Final    MODERATE WBC PRESENT,BOTH PMN AND MONONUCLEAR NO SQUAMOUS EPITHELIAL CELLS SEEN NO ORGANISMS SEEN Performed at [facility]    Culture   Final    NO GROWTH 3 DAYS Performed at [facility]    Report Status 06/16/2015 FINAL  Final  Culture, blood (routine x 2)     Status: None (Preliminary result)   Collection Time: 06/15/15  2:36 PM  Result Value Ref Range Status   Specimen Description BLOOD LEFT ARM  Final   Special Requests BOTTLES DRAWN AEROBIC AND ANAEROBIC 8CC EACH  Final   Culture NO GROWTH 4 DAYS  Final   Report Status PENDING  Incomplete  Culture, blood (routine x 2)     Status: None (Preliminary result)   Collection Time: 06/15/15  2:50 PM  Result Value Ref Range Status   Specimen Description BLOOD RIGHT ARM  Final   Special Requests   Final    BOTTLES DRAWN AEROBIC AND ANAEROBIC AEB=8CC ANA=6CC   Culture NO GROWTH 4 DAYS  Final   Report Status PENDING  Incomplete     Labs: Basic Metabolic Panel:  Recent Labs Lab 06/15/15 0630 06/16/15 0901 06/16/15 0905 06/17/15 1101 06/18/15 1010 06/19/15 0950  NA 142  --  138 138 141 140  K 3.4*  --  3.3* 3.6 4.1 3.9  CL 104  --  100* 99* 102 103  CO2 29  --  $R'27 28 29 29  'BU$ GLUCOSE 107*  --  195* 121* 97 130*  BUN 19  --  19 20 21* 19  CREATININE 1.68*  --  1.64* 1.73* 1.95*  1.65*  CALCIUM 8.4*  --  8.9 9.2 9.2 8.9  MG  --  1.9  --   --   --   --    Liver Function Tests: No results for input(s): AST, ALT, ALKPHOS, BILITOT, PROT, ALBUMIN in the last 168 hours. No results for input(s): LIPASE, AMYLASE in the last 168 hours. No results for input(s): AMMONIA in the last 168 hours. CBC:  Recent Labs Lab 06/13/15 0623 06/15/15 0630 06/17/15 1101 06/18/15 1010  WBC 9.1 8.7 13.6* 8.6  HGB 8.0* 9.6* 10.4* 10.1*  HCT 25.2* 29.4* 32.7* 32.2*  MCV 82.9 81.7 82.8 84.1  PLT 262 317 404* 359   Cardiac Enzymes:  Recent Labs Lab 06/14/15 0950 06/16/15 0905  CKTOTAL 18* 16*   BNP: BNP (last 3 results) No results for input(s): BNP in the last 8760 hours.  ProBNP (last 3 results) No results for input(s): PROBNP in the last 8760 hours.  CBG: No results for input(s): GLUCAP in the last 168 hours.     SignedRadene Gunning  [facility] 06/19/2015, 3:21 PM

## 2015-06-19 NOTE — Discharge Planning (Signed)
Pt being discharged to home vitals stable noc/o pain, pt to come to outpatient services for ATB therapy, all paperwork and prescriptions sent with patient.

## 2015-06-20 ENCOUNTER — Encounter (HOSPITAL_COMMUNITY)
Admit: 2015-06-20 | Discharge: 2015-06-20 | Disposition: A | Discharge status: Home / Self Care | Payer: Medicaid Other | Attending: Internal Medicine | Admitting: Internal Medicine

## 2015-06-20 ENCOUNTER — Encounter (HOSPITAL_COMMUNITY): Payer: Self-pay

## 2015-06-20 ENCOUNTER — Ambulatory Visit (HOSPITAL_COMMUNITY)
Admit: 2015-06-20 | Discharge: 2015-06-20 | Disposition: A | Discharge status: Home / Self Care | Payer: Medicaid Other | Attending: Internal Medicine | Admitting: Internal Medicine

## 2015-06-20 ENCOUNTER — Encounter (HOSPITAL_COMMUNITY)
Admit: 2015-06-20 | Discharge: 2015-06-20 | Disposition: A | Discharge status: Home / Self Care | Payer: Medicaid Other | Source: Ambulatory Visit | Attending: Internal Medicine | Admitting: Internal Medicine

## 2015-06-20 DIAGNOSIS — R7881 Bacteremia: Secondary | ICD-10-CM | POA: Insufficient documentation

## 2015-06-20 DIAGNOSIS — Z029 Encounter for administrative examinations, unspecified: Secondary | ICD-10-CM | POA: Insufficient documentation

## 2015-06-20 LAB — CULTURE, BLOOD (ROUTINE X 2)
Culture: NO GROWTH
Culture: NO GROWTH

## 2015-06-20 MED ORDER — HEPARIN SOD (PORK) LOCK FLUSH 100 UNIT/ML IV SOLN
INTRAVENOUS | Status: AC
  Filled 2015-06-20: qty 5

## 2015-06-20 MED ORDER — SODIUM CHLORIDE 0.9 % IJ SOLN
10.0000 mL | INTRAMUSCULAR | Status: AC | PRN
  Administered 2015-06-20: 10 mL

## 2015-06-20 MED ORDER — SODIUM CHLORIDE 0.9 % IV SOLN
500.0000 mg | INTRAVENOUS | Status: DC
  Administered 2015-06-20: 500 mg via INTRAVENOUS
  Filled 2015-06-20 (×2): qty 10

## 2015-06-20 MED ORDER — HEPARIN SOD (PORK) LOCK FLUSH 100 UNIT/ML IV SOLN
250.0000 [IU] | INTRAVENOUS | Status: AC | PRN
  Administered 2015-06-20: 250 [IU]

## 2015-06-20 MED ORDER — SODIUM CHLORIDE 0.9 % IJ SOLN: 10.0000 mL | Freq: Two times a day (BID) | INTRAMUSCULAR | Status: DC

## 2015-06-20 MED ORDER — SODIUM CHLORIDE 0.9 % IV SOLN
100.0000 mg | INTRAVENOUS | Status: DC
  Administered 2015-06-20: 100 mg via INTRAVENOUS
  Filled 2015-06-20 (×2): qty 100

## 2015-06-20 MED ORDER — SODIUM CHLORIDE 0.9 % IV SOLN
Freq: Once | INTRAVENOUS | Status: AC
  Administered 2015-06-20: 11:00:00 via INTRAVENOUS

## 2015-06-20 MED ORDER — SODIUM CHLORIDE 0.9 % IJ SOLN: 10.0000 mL | INTRAMUSCULAR | Status: DC | PRN

## 2015-06-20 NOTE — Progress Notes (Signed)
Peripherally Inserted Central Catheter/Midline Placement  The IV Nurse has discussed with the patient and/or persons authorized to consent for the patient, the purpose of this procedure and the potential benefits and risks involved with this procedure.  The benefits include less needle sticks, lab draws from the catheter and patient may be discharged home with the catheter.  Risks include, but not limited to, infection, bleeding, blood clot (thrombus formation), and puncture of an artery; nerve damage and irregular heat beat.  Alternatives to this procedure were also discussed.  PICC/Midline Placement Documentation  PICC / Midline Single Lumen 06/20/15 PICC Right Basilic 43 cm 0 cm (Active)  Indication for Insertion or Continuance of Line Prolonged intravenous therapies 06/20/2015 10:42 AM  Exposed Catheter (cm) 0 cm 06/20/2015 10:42 AM  Site Assessment Clean;Dry;Intact 06/20/2015 10:42 AM  Line Status Flushed;Blood return noted 06/20/2015 10:42 AM  Dressing Type Transparent;Securing device 06/20/2015 10:42 AM  Dressing Status Clean;Dry;Intact;Antimicrobial disc in place 06/20/2015 10:42 AM  Line Care Connections checked and tightened 06/20/2015 10:42 AM  Dressing Intervention New dressing 06/20/2015 10:42 AM  Dressing Change Due 06/27/15 06/20/2015 10:42 AM       Daleen Squibb 06/20/2015, 10:43 AM

## 2015-06-20 NOTE — Discharge Instructions (Signed)
PICC Insertion, Care After Refer to this sheet in the next few weeks. These instructions provide you with information on caring for yourself after your procedure. Your health care provider may also give you more specific instructions. Your treatment has been planned according to current medical practices, but problems sometimes occur. Call your health care provider if you have any problems or questions after your procedure. WHAT TO EXPECT AFTER THE PROCEDURE After your procedure, it is typical to have the following:  Mild discomfort at the insertion site. This should not last more than a day. HOME CARE INSTRUCTIONS  Rest at home for the remainder of the day after the procedure.  You may bend your arm and move it freely. If your PICC is near or at the bend of your elbow, avoid activity with repeated motion at the elbow.  Avoid lifting heavy objects as instructed by your health care provider.  Avoid using a crutch with the arm on the same side as your PICC. You may need to use a walker. Bandage Care  Keep your PICC bandage (dressing) clean and dry to prevent infection.  Ask your health care provider when you may shower. To keep the dressing dry, cover the PICC with plastic wrap and tape before showering. If the dressing does become wet, replace it right after the shower.  Do not soak in the bath, swim, or use hot tubs when you have a PICC.  Change the PICC dressing as instructed by your health care provider.  Change your PICC dressing if it becomes loose or wet. General PICC Care  Check the PICC insertion site daily for leakage, redness, swelling, or pain.  Flush the PICC as directed by your health care provider. Let your health care provider know right away if the PICC is difficult to flush or does not flush. Do not use force to flush the PICC.  Do not use a syringe that is less than 10 mL to flush the PICC.  Never pull or tug on the PICC.  Avoid blood pressure checks on the arm  with the PICC.  Keep your PICC identification card with you at all times.  Do not take the PICC out yourself. Only a trained health care professional should remove the PICC. SEEK MEDICAL CARE IF:  You have pain in your arm, ear, face, or teeth.  You have fever or chills.  You have drainage from the PICC insertion site.  You have redness or palpate a "cord" around the PICC insertion site.  You cannot flush the catheter. SEEK IMMEDIATE MEDICAL CARE IF:  You have swelling in the arm in which the PICC is inserted. Document Released: 08/29/2013 Document Revised: 11/13/2013 Document Reviewed: 08/29/2013 [facility] Patient Information 2015 [place], [place]. This information is not intended to replace advice given to you by your health care provider. Make sure you discuss any questions you have with your health care provider. Peripherally Inserted Central Catheter/Midline Placement  The IV Nurse has discussed with the patient and/or persons authorized to consent for the patient, the purpose of this procedure and the potential benefits and risks involved with this procedure.  The benefits include less needle sticks, lab draws from the catheter and patient may be discharged home with the catheter.  Risks include, but not limited to, infection, bleeding, blood clot (thrombus formation), and puncture of an artery; nerve damage and irregular heat beat.  Alternatives to this procedure were also discussed.  PICC/Midline Placement Documentation  PICC / Midline Single Lumen 06/20/15 PICC  Right Basilic 43 cm 0 cm (Active)  Indication for Insertion or Continuance of Line Prolonged intravenous therapies 06/20/2015 10:42 AM  Exposed Catheter (cm) 0 cm 06/20/2015 10:42 AM  Site Assessment Clean;Dry;Intact 06/20/2015 10:42 AM  Line Status Flushed;Blood return noted 06/20/2015 10:42 AM  Dressing Type Transparent;Securing device 06/20/2015 10:42 AM  Dressing Status Clean;Dry;Intact;Antimicrobial disc in place  06/20/2015 10:42 AM  Line Care Connections checked and tightened 06/20/2015 10:42 AM  Dressing Intervention New dressing 06/20/2015 10:42 AM  Dressing Change Due 06/27/15 06/20/2015 10:42 AM       Daleen Squibb 06/20/2015, 10:45 AM

## 2015-06-21 ENCOUNTER — Inpatient Hospital Stay (HOSPITAL_COMMUNITY): Admission: RE | Admit: 2015-06-21 | Payer: Medicaid Other | Source: Ambulatory Visit

## 2015-06-22 ENCOUNTER — Encounter (HOSPITAL_COMMUNITY): Payer: Medicaid Other

## 2015-06-22 ENCOUNTER — Inpatient Hospital Stay (HOSPITAL_COMMUNITY): Admission: RE | Admit: 2015-06-22 | Payer: Medicaid Other | Source: Ambulatory Visit

## 2015-06-25 ENCOUNTER — Inpatient Hospital Stay (HOSPITAL_COMMUNITY)
Admission: EM | Admit: 2015-06-25 | Discharge: 2015-06-27 | DRG: 372 | Disposition: A | Discharge status: Home / Self Care | Payer: Medicaid Other | Attending: Internal Medicine | Admitting: Internal Medicine

## 2015-06-25 ENCOUNTER — Emergency Department (HOSPITAL_COMMUNITY): Payer: Medicaid Other

## 2015-06-25 DIAGNOSIS — B9562 Methicillin resistant Staphylococcus aureus infection as the cause of diseases classified elsewhere: Secondary | ICD-10-CM | POA: Diagnosis present

## 2015-06-25 DIAGNOSIS — Z79891 Long term (current) use of opiate analgesic: Secondary | ICD-10-CM | POA: Diagnosis not present

## 2015-06-25 DIAGNOSIS — B377 Candidal sepsis: Secondary | ICD-10-CM | POA: Diagnosis present

## 2015-06-25 DIAGNOSIS — R52 Pain, unspecified: Secondary | ICD-10-CM | POA: Diagnosis present

## 2015-06-25 DIAGNOSIS — K6812 Psoas muscle abscess: Secondary | ICD-10-CM | POA: Diagnosis not present

## 2015-06-25 DIAGNOSIS — Z885 Allergy status to narcotic agent status: Secondary | ICD-10-CM | POA: Diagnosis not present

## 2015-06-25 DIAGNOSIS — F17210 Nicotine dependence, cigarettes, uncomplicated: Secondary | ICD-10-CM | POA: Diagnosis present

## 2015-06-25 DIAGNOSIS — R7881 Bacteremia: Secondary | ICD-10-CM | POA: Diagnosis present

## 2015-06-25 DIAGNOSIS — N179 Acute kidney failure, unspecified: Secondary | ICD-10-CM | POA: Diagnosis present

## 2015-06-25 LAB — BASIC METABOLIC PANEL
Anion gap: 10 (ref 5–15)
BUN: 14 mg/dL (ref 6–20)
CHLORIDE: 103 mmol/L (ref 101–111)
CO2: 26 mmol/L (ref 22–32)
Calcium: 9.6 mg/dL (ref 8.9–10.3)
Creatinine, Ser: 1.53 mg/dL — ABNORMAL HIGH (ref 0.61–1.24)
GFR calc Af Amer: >60 mL/min (ref >60)
GFR calc non Af Amer: 57 mL/min — ABNORMAL LOW (ref >60)
Glucose, Bld: 105 mg/dL — ABNORMAL HIGH (ref 65–99)
Potassium: 3.8 mmol/L (ref 3.5–5.1)
SODIUM: 139 mmol/L (ref 135–145)

## 2015-06-25 LAB — CBC WITH DIFFERENTIAL/PLATELET
Basophils Absolute: 0.1 10*3/uL (ref 0.0–0.1)
Basophils Relative: 1 % (ref 0–1)
Eosinophils Absolute: 0.1 10*3/uL (ref 0.0–0.7)
Eosinophils Relative: 1 % (ref 0–5)
HCT: 34.7 % — ABNORMAL LOW (ref 39.0–52.0)
Hemoglobin: 11.2 g/dL — ABNORMAL LOW (ref 13.0–17.0)
LYMPHS ABS: 1.7 10*3/uL (ref 0.7–4.0)
LYMPHS PCT: 15 % (ref 12–46)
MCH: 26.3 pg (ref 26.0–34.0)
MCHC: 32.3 g/dL (ref 30.0–36.0)
MCV: 81.5 fL (ref 78.0–100.0)
MONO ABS: 0.6 10*3/uL (ref 0.1–1.0)
Monocytes Relative: 5 % (ref 3–12)
NEUTROS ABS: 8.8 10*3/uL — AB (ref 1.7–7.7)
Neutrophils Relative %: 78 % — ABNORMAL HIGH (ref 43–77)
Platelets: 326 10*3/uL (ref 150–400)
RBC: 4.26 MIL/uL (ref 4.22–5.81)
RDW: 13.7 % (ref 11.5–15.5)
WBC: 11.2 10*3/uL — ABNORMAL HIGH (ref 4.0–10.5)

## 2015-06-25 LAB — LACTIC ACID, PLASMA: LACTIC ACID, VENOUS: 1.3 mmol/L (ref 0.5–2.0)

## 2015-06-25 MED ORDER — ONDANSETRON HCL 4 MG/2ML IJ SOLN: 4.0000 mg | Freq: Three times a day (TID) | INTRAMUSCULAR | Status: AC | PRN

## 2015-06-25 MED ORDER — ONDANSETRON HCL 4 MG/2ML IJ SOLN: 4.0000 mg | Freq: Four times a day (QID) | INTRAMUSCULAR | Status: DC | PRN

## 2015-06-25 MED ORDER — NICOTINE 21 MG/24HR TD PT24
21.0000 mg | MEDICATED_PATCH | Freq: Every day | TRANSDERMAL | Status: DC
  Administered 2015-06-25 – 2015-06-27 (×3): 21 mg via TRANSDERMAL
  Filled 2015-06-25 (×3): qty 1

## 2015-06-25 MED ORDER — SODIUM CHLORIDE 0.9 % IV SOLN
500.0000 mg | Freq: Once | INTRAVENOUS | Status: AC
  Administered 2015-06-25: 500 mg via INTRAVENOUS
  Filled 2015-06-25: qty 10

## 2015-06-25 MED ORDER — ENOXAPARIN SODIUM 40 MG/0.4ML ~~LOC~~ SOLN
40.0000 mg | SUBCUTANEOUS | Status: DC
  Administered 2015-06-25 – 2015-06-26 (×2): 40 mg via SUBCUTANEOUS
  Filled 2015-06-25 (×2): qty 0.4

## 2015-06-25 MED ORDER — SODIUM CHLORIDE 0.9 % IV BOLUS (SEPSIS)
1000.0000 mL | Freq: Once | INTRAVENOUS | Status: AC
  Administered 2015-06-25: 1000 mL via INTRAVENOUS

## 2015-06-25 MED ORDER — OXYCODONE HCL 10 MG PO TABS: 10.0000 mg | ORAL_TABLET | ORAL | Status: DC | PRN

## 2015-06-25 MED ORDER — ACETAMINOPHEN 650 MG RE SUPP: 650.0000 mg | Freq: Four times a day (QID) | RECTAL | Status: DC | PRN

## 2015-06-25 MED ORDER — SODIUM CHLORIDE 0.9 % IV SOLN
INTRAVENOUS | Status: DC
  Administered 2015-06-25 – 2015-06-27 (×3): via INTRAVENOUS

## 2015-06-25 MED ORDER — POLYETHYLENE GLYCOL 3350 17 G PO PACK
17.0000 g | PACK | Freq: Two times a day (BID) | ORAL | Status: DC
  Administered 2015-06-26: 17 g via ORAL
  Filled 2015-06-25 (×3): qty 1

## 2015-06-25 MED ORDER — ACETAMINOPHEN 325 MG PO TABS: 650.0000 mg | ORAL_TABLET | Freq: Four times a day (QID) | ORAL | Status: DC | PRN

## 2015-06-25 MED ORDER — OXYCODONE-ACETAMINOPHEN 5-325 MG PO TABS
2.0000 | ORAL_TABLET | Freq: Four times a day (QID) | ORAL | Status: DC | PRN
  Administered 2015-06-25: 2 via ORAL
  Filled 2015-06-25: qty 2

## 2015-06-25 MED ORDER — HYDROMORPHONE HCL 1 MG/ML IJ SOLN
1.0000 mg | INTRAMUSCULAR | Status: DC | PRN
  Administered 2015-06-25: 1 mg via INTRAVENOUS
  Filled 2015-06-25: qty 1

## 2015-06-25 MED ORDER — GABAPENTIN 300 MG PO CAPS
300.0000 mg | ORAL_CAPSULE | Freq: Three times a day (TID) | ORAL | Status: DC
  Administered 2015-06-25 – 2015-06-27 (×5): 300 mg via ORAL
  Filled 2015-06-25 (×5): qty 1

## 2015-06-25 MED ORDER — METHOCARBAMOL 500 MG PO TABS
500.0000 mg | ORAL_TABLET | Freq: Three times a day (TID) | ORAL | Status: DC | PRN
  Administered 2015-06-25 – 2015-06-27 (×3): 500 mg via ORAL
  Filled 2015-06-25 (×3): qty 1

## 2015-06-25 MED ORDER — SODIUM CHLORIDE 0.9 % IV SOLN
200.0000 mg | Freq: Once | INTRAVENOUS | Status: AC
  Administered 2015-06-25: 200 mg via INTRAVENOUS
  Filled 2015-06-25: qty 200

## 2015-06-25 MED ORDER — ONDANSETRON HCL 4 MG PO TABS: 4.0000 mg | ORAL_TABLET | Freq: Four times a day (QID) | ORAL | Status: DC | PRN

## 2015-06-25 MED ORDER — SODIUM CHLORIDE 0.9 % IV SOLN
200.0000 mg | INTRAVENOUS | Status: DC
  Filled 2015-06-25: qty 200

## 2015-06-25 MED ORDER — OXYCODONE HCL 5 MG PO TABS
10.0000 mg | ORAL_TABLET | ORAL | Status: DC | PRN
  Administered 2015-06-25: 10 mg via ORAL
  Filled 2015-06-25: qty 2

## 2015-06-25 NOTE — Progress Notes (Signed)
ANTIBIOTIC CONSULT NOTE- follow up  Pharmacy Consult for Cubicin Indication: Bacteremia  Allergies  Allergen Reactions  . Vicodin [Hydrocodone-Acetaminophen] Hives and Itching  . Adhesive [Tape] Rash   Patient Measurements: Height: 6' (182.9 cm) Weight: 167 lb 7 oz (75.949 kg) IBW/kg (Calculated) : 77.6  Vital Signs: Temp: 98.1 F (36.7 C) (08/03 1510) Temp Source: Oral (08/03 1510) BP: 143/96 mmHg (08/03 1510) Pulse Rate: 114 (08/03 1510)  Labs:  Recent Labs  06/25/15 1544  WBC 11.2*  HGB 11.2*  PLT 326  CREATININE 1.53*   Estimated Creatinine Clearance: 71.7 mL/min (by C-G formula based on Cr of 1.53).  No results for input(s): VANCOTROUGH, VANCOPEAK, VANCORANDOM, GENTTROUGH, GENTPEAK, GENTRANDOM, TOBRATROUGH, TOBRAPEAK, TOBRARND, AMIKACINPEAK, AMIKACINTROU, AMIKACIN in the last 72 hours.   Microbiology: Recent Results (from the past 720 hour(s))  Blood culture (routine x 2)     Status: None   Collection Time: 06/02/15  4:20 PM  Result Value Ref Range Status   Specimen Description BLOOD RIGHT ANTECUBITAL  Final   Special Requests BOTTLES DRAWN AEROBIC AND ANAEROBIC 6CC  Final   Culture NO GROWTH 7 DAYS  Final   Report Status 06/09/2015 FINAL  Final  Blood culture (routine x 2)     Status: None   Collection Time: 06/02/15  4:27 PM  Result Value Ref Range Status   Specimen Description BLOOD LEFT HAND  Final   Special Requests BOTTLES DRAWN AEROBIC AND ANAEROBIC 6CC  Final   Culture NO GROWTH 7 DAYS  Final   Report Status 06/09/2015 FINAL  Final  Urine culture     Status: None   Collection Time: 06/09/15  7:47 PM  Result Value Ref Range Status   Specimen Description URINE, CLEAN CATCH  Final   Special Requests NONE  Final   Culture NO GROWTH 1 DAY  Final   Report Status 06/11/2015 FINAL  Final  Culture, blood (routine x 2)     Status: None   Collection Time: 06/11/15  3:45 AM  Result Value Ref Range Status   Specimen Description BLOOD LEFT ANTECUBITAL  DRAWN BY RN  Final   Special Requests   Final    BOTTLES DRAWN AEROBIC AND ANAEROBIC AEB=8CC ANA=10CC   Culture  Setup Time   Final    YEAST RECOVERED FROM THE AEROBIC BOTTLE Gram Stain Report Called to,Read Back By and Verified With: BULLINS,M. AT 0945 ON 06/12/2015 BY RESSEGGER,R. Performed at [facility]    Culture NO GROWTH 5 DAYS  Final   Report Status 06/16/2015 FINAL  Final  Culture, blood (routine x 2)     Status: None   Collection Time: 06/11/15  3:55 AM  Result Value Ref Range Status   Specimen Description BLOOD PICC LINE DRAWN BY RN TT  Final   Special Requests BOTTLES DRAWN AEROBIC ONLY 8CC  Final   Culture  Setup Time   Final    YEAST RECOVERED FORM THE AEROBIC BOTTLE Gram Stain Report Called to,Read Back By and Verified With: BULLINS,M. AT 0945 ON 06/12/2015 BY RESSEGGER,R.    Culture   Final    CANDIDA PARAPSILOSIS Performed at [facility]    Report Status 06/15/2015 FINAL  Final  Culture, routine-abscess     Status: None   Collection Time: 06/12/15  4:10 PM  Result Value Ref Range Status   Specimen Description ABSCESS  Final   Special Requests PSOAS LEFT MUSCLE  Final   Gram Stain   Final    MODERATE WBC  PRESENT,BOTH PMN AND MONONUCLEAR NO SQUAMOUS EPITHELIAL CELLS SEEN NO ORGANISMS SEEN Performed at [facility]    Culture   Final    NO GROWTH 3 DAYS Performed at [facility]    Report Status 06/16/2015 FINAL  Final  Culture, blood (routine x 2)     Status: None   Collection Time: 06/15/15  2:36 PM  Result Value Ref Range Status   Specimen Description BLOOD LEFT ARM  Final   Special Requests BOTTLES DRAWN AEROBIC AND ANAEROBIC 8CC EACH  Final   Culture NO GROWTH 5 DAYS  Final   Report Status 06/20/2015 FINAL  Final  Culture, blood (routine x 2)     Status: None   Collection Time: 06/15/15  2:50 PM  Result Value Ref Range Status   Specimen Description BLOOD RIGHT ARM  Final   Special Requests   Final    BOTTLES DRAWN  AEROBIC AND ANAEROBIC AEB=8CC ANA=6CC   Culture NO GROWTH 5 DAYS  Final   Report Status 06/20/2015 FINAL  Final   Medical History: Past Medical History  Diagnosis Date  . H/O back injury   . MRSA (methicillin resistant Staphylococcus aureus)   . 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 MRSA bacteremia 04/2015   Medications:  Scheduled:  . anidulafungin  200 mg Intravenous Once  . DAPTOmycin (CUBICIN)  IV  500 mg Intravenous Once   Anti-infectives    Start     Dose/Rate Route Frequency Ordered Stop   06/25/15 2030  DAPTOmycin (CUBICIN) 500 mg in sodium chloride 0.9 % IVPB     500 mg 220 mL/hr over 30 Minutes Intravenous  Once 06/25/15 1952     06/25/15 2000  anidulafungin (ERAXIS) 200 mg in sodium chloride 0.9 % 200 mL IVPB     200 mg over 180 Minutes Intravenous  Once 06/25/15 1903       Assessment: HPI Comments: 39 year old male with extensive past medical history outlined below including iliopsoas abscess, MRSA bacteremia, and candidemia who presents with back and leg pain. Patient was recently hospitalized for his ongoing problems with his iliopsoas abscess. He was discharged home approximately one week ago with a PICC line with plans to receive antibiotics and antifungal medications daily here at the hospital. He states that he has been unable to get a ride to the hospital and thus has not had antibiotics and a week. He reports worsening pain in his left back and leg down his sciatic nerve. He endorses sweats and chills.  Goal of Therapy:  Eradicate infection.   Plan:  Daptomycin 500mg  IV now x 1 in ED F/U plan after admission, labs, c/s   Wayland Denis, [facility] 06/25/2015,8:01 PM

## 2015-06-25 NOTE — H&P (Signed)
PCP:   No PCP Per Patient   Chief Complaint:  Back pain  HPI:  39 year old male who  has a past medical history of H/O back injury; MRSA (methicillin resistant Staphylococcus aureus); Iliopsoas abscess (04/2015); and MRSA bacteremia (04/2015). Patient was recently discharged from the hospital on 06/19/15 after he was diagnosed with ileopsoas  abscess, MRSA bacteremia and candidemia. Patient was discharged on daptomycin and anti-fungal Eraxis. As patient did not have home has improved he was supposed to come to the ED daily for getting IV antibiotic. Patient only came one day and did not get IV antibiotic for last 7 days. Now his complaint of worsening back pain, chills, subjective fever. Lab work in the ED reveals no significant abnormality. Patient again started on daptomycin and anti-fungal Eraxis. He denies chest pain, no shortness of breath, no nausea no vomiting no diarrhea. No dysuria urgency frequency of urination. Patient says that he still has left-sided lower back pain and rates 9/10 intensity.  Allergies:   Allergies  Allergen Reactions  . Vicodin [Hydrocodone-Acetaminophen] Hives and Itching  . Adhesive [Tape] Rash      Past Medical History  Diagnosis Date  . H/O back injury   . MRSA (methicillin resistant Staphylococcus aureus)   . 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 MRSA bacteremia 04/2015    Past Surgical History  Procedure Laterality Date  . Skin biopsy    . Abdmoninal drain insertion    . Tee without cardioversion N/A 06/13/2015    Procedure: TRANSESOPHAGEAL ECHOCARDIOGRAM (TEE);  Surgeon: Wendall Stade, MD;  Location: [facility];  Service: Cardiovascular;  Laterality: N/A;    Prior to Admission medications   Medication Sig Start Date End Date Taking? Authorizing Provider  anidulafungin in sodium chloride 0.9 % 100 mL Daily IV last dose 06/26/15. 06/19/15  Yes Lesle Chris Black, NP  DAPTOmycin in sodium chloride 0.9 % 100 mL Daily IV last dose 07/08/15. Weigh daily and  pharmacy to adjust daily based on weight. 06/19/15  Yes Lesle Chris Black, NP  gabapentin (NEURONTIN) 300 MG capsule Take 1 capsule (300 mg total) by mouth 3 (three) times daily. 06/19/15  Yes Lesle Chris Black, NP  methocarbamol (ROBAXIN) 500 MG tablet Take 1 tablet (500 mg total) by mouth every 8 (eight) hours as needed for muscle spasms. 06/19/15  Yes Lesle Chris Black, NP  Oxycodone HCl 10 MG TABS Take 1 tablet (10 mg total) by mouth every 4 (four) hours as needed. 06/19/15  Yes Lesle Chris Black, NP  nicotine (NICODERM CQ - DOSED IN MG/24 HOURS) 21 mg/24hr patch Place 1 patch (21 mg total) onto the skin daily. Patient not taking: Reported on 06/25/2015 06/19/15   Gwenyth Bender, NP  polyethylene glycol [facility] Ethe[facility] Hal) packet Take 17 g by mouth 2 (two) times daily. May change to as needed once with regular bowel movements. Patient not taking: Reported on 05/28/2015 05/23/15   Osvaldo Shipper, MD  traMADol (ULTRAM) 50 MG tablet Take 2 tablets (100 mg total) by mouth every 6 (six) hours. Patient not taking: Reported on 06/02/2015 05/23/15   Osvaldo Shipper, MD    Social History:  reports that he has been smoking Cigarettes.  He has a 10 pack-year smoking history. He has never used smokeless tobacco. He reports that he does not drink alcohol or use illicit drugs.  Family History  Problem Relation Age of Onset  . Cancer Father     Ceasar Mons Weights   06/25/15 1510 06/25/15  1843  Weight: 81.647 kg (180 lb) 75.949 kg (167 lb 7 oz)      Review of Systems:  As in history of present illness, rest of the review of systems negative  Physical Exam: Blood pressure 143/96, pulse 114, temperature 98.1 F (36.7 C), temperature source Oral, resp. rate 20, height 6' (1.829 m), weight 75.949 kg (167 lb 7 oz), SpO2 99 %. Constitutional:   Patient is a well-developed and well-nourished male* in no acute distress and cooperative with exam. Head: Normocephalic and atraumatic Mouth: Mucus membranes moist Eyes: PERRL, EOMI,  conjunctivae normal Neck: Supple, No Thyromegaly Cardiovascular: RRR, S1 normal, S2 normal Pulmonary/Chest: CTAB, no wheezes, rales, or rhonchi Abdominal: Soft. Non-tender, non-distended, bowel sounds are normal, no masses, organomegaly, or guarding present.  Neurological: A&O x3, Strength is normal and symmetric bilaterally, cranial nerve II-XII are grossly intact, no focal motor deficit, sensory intact to light touch bilaterally.  Extremities : No Cyanosis, Clubbing or Edema Lumbosacral spine- tenderness in left SI joint, range of motion of left lower extremity limited on flexion  Labs on Admission:  Basic Metabolic Panel:  Recent Labs Lab 06/19/15 0950 06/25/15 1544  NA 140 139  K 3.9 3.8  CL 103 103  CO2 29 26  GLUCOSE 130* 105*  BUN 19 14  CREATININE 1.65* 1.53*  CALCIUM 8.9 9.6   CBC:  Recent Labs Lab 06/25/15 1544  WBC 11.2*  NEUTROABS 8.8*  HGB 11.2*  HCT 34.7*  MCV 81.5  PLT 326    Radiological Exams on Admission: Dg Chest Portable 1 View  06/25/2015   CLINICAL DATA:  Generalized body aches and pain.  History of MRSA.  EXAM: PORTABLE CHEST - 1 VIEW  COMPARISON:  06/11/2015  FINDINGS: Right-sided PICC line in place with tip over the SVC. Lungs are adequately inflated without consolidation or effusion. Cardiomediastinal silhouette and remainder of the exam is unchanged.  IMPRESSION: No active disease.   Electronically Signed   By: Elberta Fortis M.D.   On: 06/25/2015 18:52     Assessment/Plan Active Problems:   Iliopsoas abscess   MRSA bacteremia   Acute kidney injury (nontraumatic)   Intractable pain   Candidemia   Bacteremia  39 year old male with recent history of MRSA bacteremia, candidemia, iliopsoas abscess now being readmitted as patient could not get IV antibiotic for past 1 week. Will start the patient on daptomycin per pharmacy consultation and anti-fungal ERAXIS. Patient now has Medicaid, will need social work to arrange for home health services  to provide the antibiotics at home. If above antibiotics are not covered by insurance, consider ID consultation to change antibiotics. Oxycodone 10 mg every 6 hours when necessary for pain  Code status: Full code  Family discussion: Admission, patients condition and plan of care including tests being ordered have been discussed with the patient and *his wife at bedside who indicate understanding and agree with the plan and Code Status.   Time Spent on Admission: 60 min  Roshawnda Pecora S [facility] Pager: [phone] 06/25/2015, 7:42 PM  If 7PM-7AM, please contact night-coverage  [email or web address]  Password [email or web address]

## 2015-06-25 NOTE — ED Notes (Signed)
Called nursing supervisor for Eraxis.  None in ED [place]

## 2015-06-25 NOTE — ED Provider Notes (Signed)
CSN: [id number]     Arrival date & time 06/25/15  1449 History   First MD Initiated Contact with Patient 06/25/15 1814     Chief Complaint  Patient presents with  . Pain     (Consider location/radiation/quality/duration/timing/severity/associated sxs/prior Treatment) HPI Comments: 39 year old male with extensive past medical history outlined below including iliopsoas abscess, MRSA bacteremia, and candidemia who presents with back and leg pain. Patient was recently hospitalized for his ongoing problems with his iliopsoas abscess. He was discharged home approximately one week ago with a PICC line with plans to receive antibiotics and antifungal medications daily here at the hospital. He states that he has been unable to get a ride to the hospital and thus has not had antibiotics and a week. He reports worsening pain in his left back and leg down his sciatic nerve. He endorses sweats and chills. No vomiting or diarrhea. He endorses generalized body pain. No problems with his PICC line.  The history is provided by the patient.    Past Medical History  Diagnosis Date  . H/O back injury   . MRSA (methicillin resistant Staphylococcus aureus)   . 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 MRSA bacteremia 04/2015   Past Surgical History  Procedure Laterality Date  . Skin biopsy    . Abdmoninal drain insertion    . Tee without cardioversion N/A 06/13/2015    Procedure: TRANSESOPHAGEAL ECHOCARDIOGRAM (TEE);  Surgeon: Wendall Stade, MD;  Location: [facility];  Service: Cardiovascular;  Laterality: N/A;   Family History  Problem Relation Age of Onset  . Cancer Father    History  Substance Use Topics  . Smoking status: Current Every Day Smoker -- 0.50 packs/day for 20 years    Types: Cigarettes  . Smokeless tobacco: Never Used  . Alcohol Use: No    Review of Systems 10 Systems reviewed and are negative for acute change except as noted in the HPI.    Allergies  Vicodin and Adhesive  Home  Medications   Prior to Admission medications   Medication Sig Start Date End Date Taking? Authorizing Provider  anidulafungin in sodium chloride 0.9 % 100 mL Daily IV last dose 06/26/15. 06/19/15  Yes Lesle Chris Black, NP  DAPTOmycin in sodium chloride 0.9 % 100 mL Daily IV last dose 07/08/15. Weigh daily and pharmacy to adjust daily based on weight. 06/19/15  Yes Lesle Chris Black, NP  gabapentin (NEURONTIN) 300 MG capsule Take 1 capsule (300 mg total) by mouth 3 (three) times daily. 06/19/15  Yes Lesle Chris Black, NP  methocarbamol (ROBAXIN) 500 MG tablet Take 1 tablet (500 mg total) by mouth every 8 (eight) hours as needed for muscle spasms. 06/19/15  Yes Lesle Chris Black, NP  Oxycodone HCl 10 MG TABS Take 1 tablet (10 mg total) by mouth every 4 (four) hours as needed. 06/19/15  Yes Lesle Chris Black, NP  nicotine (NICODERM CQ - DOSED IN MG/24 HOURS) 21 mg/24hr patch Place 1 patch (21 mg total) onto the skin daily. Patient not taking: Reported on 06/25/2015 06/19/15   Gwenyth Bender, NP  polyethylene glycol [facility] Ethelene Hal) packet Take 17 g by mouth 2 (two) times daily. May change to as needed once with regular bowel movements. Patient not taking: Reported on 05/28/2015 05/23/15   Osvaldo Shipper, MD  traMADol (ULTRAM) 50 MG tablet Take 2 tablets (100 mg total) by mouth every 6 (six) hours. Patient not taking: Reported on 06/02/2015 05/23/15   Osvaldo Shipper, MD  BP 143/96 mmHg  Pulse 114  Temp(Src) 98.1 F (36.7 C) (Oral)  Resp 20  Ht 6' (1.829 m)  Wt 167 lb 7 oz (75.949 kg)  BMI 22.70 kg/m2  SpO2 99% Physical Exam  Constitutional: He is oriented to person, place, and time. He appears well-developed and well-nourished. No distress.  Laying on R side rubbing L leg and hip  HENT:  Head: Normocephalic and atraumatic.  Moist mucous membranes  Eyes: Conjunctivae are normal. Pupils are equal, round, and reactive to light.  Neck: Neck supple.  Cardiovascular: Normal rate, regular rhythm and normal heart sounds.   No  murmur heard. Pulmonary/Chest: Effort normal and breath sounds normal.  Abdominal: Soft. Bowel sounds are normal. He exhibits no distension. There is no tenderness.  Musculoskeletal: He exhibits no edema.  Neurological: He is alert and oriented to person, place, and time.  Fluent speech  Skin: Skin is warm and dry.  RUE PICC in place w/ no erythema or drainage  Psychiatric: He has a normal mood and affect. Judgment normal.  Nursing note and vitals reviewed.   ED Course  Procedures (including critical care time) Labs Review Labs Reviewed  CBC WITH DIFFERENTIAL/PLATELET - Abnormal; Notable for the following:    WBC 11.2 (*)    Hemoglobin 11.2 (*)    HCT 34.7 (*)    Neutrophils Relative % 78 (*)    Neutro Abs 8.8 (*)    All other components within normal limits  BASIC METABOLIC PANEL - Abnormal; Notable for the following:    Glucose, Bld 105 (*)    Creatinine, Ser 1.53 (*)    GFR calc non Af Amer 57 (*)    All other components within normal limits  CULTURE, BLOOD (ROUTINE X 2)  CULTURE, BLOOD (ROUTINE X 2)  URINE CULTURE  FUNGUS CULTURE, BLOOD  URINALYSIS, ROUTINE W REFLEX MICROSCOPIC (NOT AT [facility])  I-STAT CG4 LACTIC ACID, ED    Imaging Review Dg Chest Portable 1 View  06/25/2015   CLINICAL DATA:  Generalized body aches and pain.  History of MRSA.  EXAM: PORTABLE CHEST - 1 VIEW  COMPARISON:  06/11/2015  FINDINGS: Right-sided PICC line in place with tip over the SVC. Lungs are adequately inflated without consolidation or effusion. Cardiomediastinal silhouette and remainder of the exam is unchanged.  IMPRESSION: No active disease.   Electronically Signed   By: Elberta Fortis M.D.   On: 06/25/2015 18:52     EKG Interpretation None     Medications  sodium chloride 0.9 % bolus 1,000 mL (not administered)  anidulafungin (ERAXIS) 200 mg in sodium chloride 0.9 % 200 mL IVPB (not administered)  oxyCODONE-acetaminophen (PERCOCET/ROXICET) 5-325 MG per tablet 2 tablet (not  administered)    MDM   Final diagnoses:  Candidemia  MRSA bacteremia  Psoas abscess   39 year old male with known MRSA bacteremia and candidemia presents for worsening leg pain in setting of not receiving antibiotics for 1 week due to access issues. Patient awake, alert, uncomfortable but in no acute distress on exam. He was mildly tachycardic but afebrile. No evidence of infection of PICC line. Because of the patient's tachycardia and known bloodstream infection, initiated a code sepsis. Obtained labs listed above which show a stable white blood cell count of 11,000 and improving kidney injury with creatinine of 1.53. Ordered patient daptomycin and anidulafungin per recent ID recs. Also gave fluid bolus. Patient admitted to general medicine for continued IV antibiotic and antifungal therapy.   Laurence Spates, MD 06/25/15  1936 

## 2015-06-25 NOTE — ED Notes (Addendum)
Pt reports seen for the same in past. Pt reports has had PICC placed x2 weeks ago. Pt reports has not been able to get at home abx started yet and has only had one treatment of outpatient abx. Pt reprots generalized pain.

## 2015-06-26 ENCOUNTER — Encounter (HOSPITAL_COMMUNITY): Payer: Self-pay

## 2015-06-26 DIAGNOSIS — R52 Pain, unspecified: Secondary | ICD-10-CM

## 2015-06-26 LAB — COMPREHENSIVE METABOLIC PANEL
ALT: 11 U/L — ABNORMAL LOW (ref 17–63)
AST: 14 U/L — AB (ref 15–41)
Albumin: 3.2 g/dL — ABNORMAL LOW (ref 3.5–5.0)
Alkaline Phosphatase: 154 U/L — ABNORMAL HIGH (ref 38–126)
Anion gap: 6 (ref 5–15)
BILIRUBIN TOTAL: 0.5 mg/dL (ref 0.3–1.2)
BUN: 16 mg/dL (ref 6–20)
CALCIUM: 8.7 mg/dL — AB (ref 8.9–10.3)
CO2: 26 mmol/L (ref 22–32)
Chloride: 104 mmol/L (ref 101–111)
Creatinine, Ser: 1.45 mg/dL — ABNORMAL HIGH (ref 0.61–1.24)
GFR calc Af Amer: >60 mL/min (ref >60)
GFR calc non Af Amer: >60 mL/min (ref >60)
GLUCOSE: 94 mg/dL (ref 65–99)
Potassium: 3.7 mmol/L (ref 3.5–5.1)
Sodium: 136 mmol/L (ref 135–145)
Total Protein: 7.7 g/dL (ref 6.5–8.1)

## 2015-06-26 LAB — CBC
HCT: 30.2 % — ABNORMAL LOW (ref 39.0–52.0)
HEMOGLOBIN: 9.6 g/dL — AB (ref 13.0–17.0)
MCH: 25.9 pg — AB (ref 26.0–34.0)
MCHC: 31.8 g/dL (ref 30.0–36.0)
MCV: 81.6 fL (ref 78.0–100.0)
Platelets: 301 10*3/uL (ref 150–400)
RBC: 3.7 MIL/uL — AB (ref 4.22–5.81)
RDW: 13.5 % (ref 11.5–15.5)
WBC: 8.5 10*3/uL (ref 4.0–10.5)

## 2015-06-26 LAB — MRSA PCR SCREENING: MRSA BY PCR: NEGATIVE

## 2015-06-26 LAB — LACTIC ACID, PLASMA: Lactic Acid, Venous: 0.7 mmol/L (ref 0.5–2.0)

## 2015-06-26 MED ORDER — OXYCODONE HCL 5 MG PO TABS
10.0000 mg | ORAL_TABLET | ORAL | Status: DC | PRN
  Administered 2015-06-26 – 2015-06-27 (×6): 10 mg via ORAL
  Filled 2015-06-26 (×6): qty 2

## 2015-06-26 MED ORDER — ENSURE ENLIVE PO LIQD: 237.0000 mL | Freq: Two times a day (BID) | ORAL | Status: DC

## 2015-06-26 MED ORDER — HYDROMORPHONE HCL 1 MG/ML IJ SOLN
1.0000 mg | INTRAMUSCULAR | Status: DC | PRN
  Administered 2015-06-26 – 2015-06-27 (×5): 1 mg via INTRAVENOUS
  Filled 2015-06-26 (×5): qty 1

## 2015-06-26 MED ORDER — SODIUM CHLORIDE 0.9 % IV SOLN
100.0000 mg | INTRAVENOUS | Status: DC
  Administered 2015-06-26: 100 mg via INTRAVENOUS
  Filled 2015-06-26 (×4): qty 100

## 2015-06-26 MED ORDER — HYDROMORPHONE HCL 1 MG/ML IJ SOLN
2.0000 mg | INTRAMUSCULAR | Status: DC | PRN
  Administered 2015-06-26 (×3): 2 mg via INTRAVENOUS
  Filled 2015-06-26 (×3): qty 2

## 2015-06-26 MED ORDER — SODIUM CHLORIDE 0.9 % IV SOLN
500.0000 mg | INTRAVENOUS | Status: DC
  Administered 2015-06-26: 500 mg via INTRAVENOUS
  Filled 2015-06-26 (×4): qty 10

## 2015-06-26 MED ORDER — OXYCODONE-ACETAMINOPHEN 5-325 MG PO TABS: 2.0000 | ORAL_TABLET | ORAL | Status: DC | PRN

## 2015-06-26 NOTE — Care Management Note (Signed)
Case Management Note  Patient Details  Name: DEEJAY KOPPELMAN MRN: [id number] Date of Birth: [birth year]  Subjective/Objective:                  Pt readmitted from home with bacteremia. Pt lives with his wife and will return home at discharge. Pt is independent with ADL's. On last admission, pt IV AB arranged with [facility] outpt dept. Pt made 1 visit, had PICC line placed and received AB. Pt has not shown up for the rest of the IV AB. Pt stated that he did not have transportation. Pt is also followed by ID in [place].   Action/Plan: Information faxed to [facility] for IV AB at discharge. Will also arrange PCP with [facility].   Expected Discharge Date:  07/01/15               Expected Discharge Plan:  Home w Home Health Services  In-House Referral:  NA  Discharge planning Services  CM Consult  Post Acute Care Choice:  Home Health Choice offered to:  Patient  DME Arranged:    DME Agency:     HH Arranged:  RN, IV Antibiotics HH Agency:   ([facility])  Status of Service:  In process, will continue to follow  Medicare Important Message Given:    Date Medicare IM Given:    Medicare IM give by:    Date Additional Medicare IM Given:    Additional Medicare Important Message give by:     If discussed at Long Length of Stay Meetings, dates discussed:    Additional Comments:  Cheryl Flash, RN 06/26/2015, 11:04 AM

## 2015-06-26 NOTE — Progress Notes (Signed)
1415 MRSA PCR results negative, contact precautions d/c.

## 2015-06-26 NOTE — Progress Notes (Signed)
1610 Patient c/o persistent pain and requesting Oxycodone  PO for break through pain, reporting that Dilaudid Q4H isn't completely effective for his pain. MD notified.

## 2015-06-26 NOTE — Progress Notes (Signed)
Initial Nutrition Assessment  DOCUMENTATION CODES:  Not applicable  INTERVENTION:  Ensure Enlive po BID, each supplement provides 350 kcal and 20 grams of protein  NUTRITION DIAGNOSIS:  Increased nutrient needs related to acute illness as evidenced by loss of 3.5% bw in the past month  GOAL:  Patient will meet greater than or equal to 90% of their needs  MONITOR:  PO intake, Supplement acceptance, Labs, Weight trends, Skin  REASON FOR ASSESSMENT:  Malnutrition Screening Tool    ASSESSMENT:  39 year old male  PMHx MRSA, Iliopsoas abscess (04/2015); and MRSA bacteremia (04/2015).Patient was recently discharged from the hospital on 06/19/15 after he was diagnosed with ileopsoas abscess, MRSA bacteremia and candidemia.  Pt was noncompliant with treatment regimen. Now presents with worsening back pain, chills, subjective fever.   Pt reports that his appetite had been non-existant these past couple weeks. He believes his lack of appetite was either caused by the infection or the medications used to treat it. Denies N/V/C/D. Endorses extreme fatigue. He did not take vitamin/min supplements at home  He states his normal weight is 220 lbs though there is no documentation to confirm he has weighed this much recently. Unfortunately, his weight history is very hard to follow and he has many drastic changes in weight from day to day-unable to determine what is reported vs actually measured.   He was weighed at office visit on 7/6 as 173. His weight now is 167 lbs. This is a 3.5% loss in 1 month.   At this time his appetite is returning. He ate 100% of his last meal and is also receiving double portions. He is agreeable to Ensure  NFPE: Only did visual as dietary was coming to take dinner order.   Diet Order:  Regular  Skin:  Dry  Last BM:  8/3  Height:  Ht Readings from Last 1 Encounters:  06/25/15 6' (1.829 m)    Weight:  Wt Readings from Last 1 Encounters:  06/25/15 167 lb 7 oz  (75.949 kg)   Wt Readings from Last 10 Encounters:  06/25/15 167 lb 7 oz (75.949 kg)  06/19/15 188 lb 6.4 oz (85.458 kg)  06/02/15 174 lb (78.926 kg)  06/02/15 174 lb (78.926 kg)  05/28/15 173 lb 12.8 oz (78.835 kg)  05/15/15 200 lb (90.719 kg)  05/15/15 200 lb (90.719 kg)  05/05/15 200 lb (90.719 kg)  05/04/15 200 lb (90.719 kg)  12/11/14 200 lb (90.719 kg)   Ideal Body Weight:  80.9 kg  BMI:  Body mass index is 22.7 kg/(m^2).  Estimated Nutritional Needs:  Kcal:  2000-2200 kcals (26-29 kcal/kg) Protein:  81-97 g Pro (1-1.2 g/kg IBW) Fluid:  > 2 liters  EDUCATION NEEDS:  No education needs identified at this time  Christophe Louis RD, LDN Nutrition Pager: [phone] 06/26/2015 2:28 PM

## 2015-06-26 NOTE — Progress Notes (Signed)
ANTIBIOTIC CONSULT NOTE- follow up  Pharmacy Consult for Cubicin and Eraxis Indication: Bacteremia / candidemia  Allergies  Allergen Reactions  . Vicodin [Hydrocodone-Acetaminophen] Hives and Itching  . Adhesive [Tape] Rash   Patient Measurements: Height: 6' (182.9 cm) Weight: 167 lb 7 oz (75.949 kg) IBW/kg (Calculated) : 77.6  Vital Signs: Temp: 98.3 F (36.8 C) (08/03 2056) Temp Source: Oral (08/03 2056) BP: 126/86 mmHg (08/03 2056) Pulse Rate: 83 (08/03 2056)  Labs:  Recent Labs  06/25/15 1544 06/26/15 0150  WBC 11.2* 8.5  HGB 11.2* 9.6*  PLT 326 301  CREATININE 1.53* 1.45*   Estimated Creatinine Clearance: 75.6 mL/min (by C-G formula based on Cr of 1.45).  No results for input(s): VANCOTROUGH, VANCOPEAK, VANCORANDOM, GENTTROUGH, GENTPEAK, GENTRANDOM, TOBRATROUGH, TOBRAPEAK, TOBRARND, AMIKACINPEAK, AMIKACINTROU, AMIKACIN in the last 72 hours.   Microbiology: Recent Results (from the past 720 hour(s))  Blood culture (routine x 2)     Status: None   Collection Time: 06/02/15  4:20 PM  Result Value Ref Range Status   Specimen Description BLOOD RIGHT ANTECUBITAL  Final   Special Requests BOTTLES DRAWN AEROBIC AND ANAEROBIC 6CC  Final   Culture NO GROWTH 7 DAYS  Final   Report Status 06/09/2015 FINAL  Final  Blood culture (routine x 2)     Status: None   Collection Time: 06/02/15  4:27 PM  Result Value Ref Range Status   Specimen Description BLOOD LEFT HAND  Final   Special Requests BOTTLES DRAWN AEROBIC AND ANAEROBIC 6CC  Final   Culture NO GROWTH 7 DAYS  Final   Report Status 06/09/2015 FINAL  Final  Urine culture     Status: None   Collection Time: 06/09/15  7:47 PM  Result Value Ref Range Status   Specimen Description URINE, CLEAN CATCH  Final   Special Requests NONE  Final   Culture NO GROWTH 1 DAY  Final   Report Status 06/11/2015 FINAL  Final  Culture, blood (routine x 2)     Status: None   Collection Time: 06/11/15  3:45 AM  Result Value Ref Range  Status   Specimen Description BLOOD LEFT ANTECUBITAL DRAWN BY RN  Final   Special Requests   Final    BOTTLES DRAWN AEROBIC AND ANAEROBIC AEB=8CC ANA=10CC   Culture  Setup Time   Final    YEAST RECOVERED FROM THE AEROBIC BOTTLE Gram Stain Report Called to,Read Back By and Verified With: BULLINS,M. AT 0945 ON 06/12/2015 BY RESSEGGER,R. Performed at [facility]    Culture NO GROWTH 5 DAYS  Final   Report Status 06/16/2015 FINAL  Final  Culture, blood (routine x 2)     Status: None   Collection Time: 06/11/15  3:55 AM  Result Value Ref Range Status   Specimen Description BLOOD PICC LINE DRAWN BY RN TT  Final   Special Requests BOTTLES DRAWN AEROBIC ONLY 8CC  Final   Culture  Setup Time   Final    YEAST RECOVERED FORM THE AEROBIC BOTTLE Gram Stain Report Called to,Read Back By and Verified With: BULLINS,M. AT 0945 ON 06/12/2015 BY RESSEGGER,R.    Culture   Final    CANDIDA PARAPSILOSIS Performed at [facility]    Report Status 06/15/2015 FINAL  Final  Culture, routine-abscess     Status: None   Collection Time: 06/12/15  4:10 PM  Result Value Ref Range Status   Specimen Description ABSCESS  Final   Special Requests PSOAS LEFT MUSCLE  Final  Gram Stain   Final    MODERATE WBC PRESENT,BOTH PMN AND MONONUCLEAR NO SQUAMOUS EPITHELIAL CELLS SEEN NO ORGANISMS SEEN Performed at [facility]    Culture   Final    NO GROWTH 3 DAYS Performed at [facility]    Report Status 06/16/2015 FINAL  Final  Culture, blood (routine x 2)     Status: None   Collection Time: 06/15/15  2:36 PM  Result Value Ref Range Status   Specimen Description BLOOD LEFT ARM  Final   Special Requests BOTTLES DRAWN AEROBIC AND ANAEROBIC 8CC EACH  Final   Culture NO GROWTH 5 DAYS  Final   Report Status 06/20/2015 FINAL  Final  Culture, blood (routine x 2)     Status: None   Collection Time: 06/15/15  2:50 PM  Result Value Ref Range Status   Specimen Description BLOOD RIGHT ARM   Final   Special Requests   Final    BOTTLES DRAWN AEROBIC AND ANAEROBIC AEB=8CC ANA=6CC   Culture NO GROWTH 5 DAYS  Final   Report Status 06/20/2015 FINAL  Final  Blood Culture (routine x 2)     Status: None (Preliminary result)   Collection Time: 06/25/15  7:10 PM  Result Value Ref Range Status   Specimen Description BLOOD LEFT HAND  Final   Special Requests BOTTLES DRAWN AEROBIC AND ANAEROBIC 6CC  Final   Culture PENDING  Incomplete   Report Status PENDING  Incomplete  Fungus culture, blood     Status: None (Preliminary result)   Collection Time: 06/25/15  7:35 PM  Result Value Ref Range Status   Specimen Description BLOOD LEFT ARM  Final   Special Requests BOTTLES DRAWN AEROBIC AND ANAEROBIC 6CC  Final   Culture PENDING  Incomplete   Report Status PENDING  Incomplete   Medical History: Past Medical History  Diagnosis Date  . H/O back injury   . MRSA (methicillin resistant Staphylococcus aureus)   . 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 MRSA bacteremia 04/2015   Medications:  Scheduled:  . anidulafungin (ERAXIS) 100 mg IVPB  100 mg Intravenous Q24H  . DAPTOmycin (CUBICIN)  IV  500 mg Intravenous Q24H  . enoxaparin (LOVENOX) injection  40 mg Subcutaneous Q24H  . gabapentin  300 mg Oral TID  . nicotine  21 mg Transdermal Daily  . polyethylene glycol  17 g Oral BID   Anti-infectives    Start     Dose/Rate Route Frequency Ordered Stop   06/26/15 2000  anidulafungin (ERAXIS) 100 mg in sodium chloride 0.9 % 100 mL IVPB     100 mg over 90 Minutes Intravenous Every 24 hours 06/26/15 0828     06/26/15 1800  DAPTOmycin (CUBICIN) 500 mg in sodium chloride 0.9 % IVPB     500 mg 220 mL/hr over 30 Minutes Intravenous Every 24 hours 06/26/15 0831     06/26/15 0000  anidulafungin (ERAXIS) 200 mg in sodium chloride 0.9 % 100 mL IVPB  Status:  Discontinued     200 mg over 90 Minutes Intravenous Every 24 hours 06/25/15 2051 06/26/15 0828   06/25/15 2030  DAPTOmycin (CUBICIN) 500 mg in  sodium chloride 0.9 % IVPB     500 mg 220 mL/hr over 30 Minutes Intravenous  Once 06/25/15 1952 06/25/15 2338   06/25/15 2000  anidulafungin (ERAXIS) 200 mg in sodium chloride 0.9 % 200 mL IVPB     200 mg over 180 Minutes Intravenous  Once 06/25/15 1903  06/25/15 2323     Assessment: HPI Comments: 39 year old male with h/o iliopsoas abscess, MRSA bacteremia, and candidemia who presents with back and leg pain. Patient was recently hospitalized for his ongoing problems with his iliopsoas abscess. He was discharged home approximately one week ago with a PICC line with plans to receive antibiotics and antifungal medications daily here at the hospital. He states that he has been unable to get a ride to the hospital and thus has not had antibiotics. He reports worsening pain in his left back and leg down his sciatic nerve. He endorses sweats and chills.  SCr slightly elevated. Pt received Daptomycin  and Eraxis  last pm on admission.  Goal of Therapy:  Eradicate infection.   Plan:  Daptomycin /Kg IV q24hrs (rounded up to )  Eraxis  IV q24hrs F/U labs, c/s   Wayland Denis, RPH 06/26/2015,8:32 AM

## 2015-06-26 NOTE — Progress Notes (Signed)
[facility] PROGRESS NOTE  PHUONG MOFFATT WUJ:[id number] DOB: 02-Aug-1976    PCP:   No PCP Per Patient   HPI: [name] is an 39 y.o. 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 Patient was recently discharged from the hospital on 06/19/15 after he was diagnosed with ileopsoas abscess, MRSA bacteremia and candidemia. Patient was discharged on daptomycin and anti-fungal Eraxis, but was not able to come to the ER for the last week so he did not get his antibiotics, now readmitted for antibiotics.  He now has medicaid.  Rewiew of Systems:  Constitutional: Negative for malaise, fever and chills. No significant weight loss or weight gain Eyes: Negative for eye pain, redness and discharge, diplopia, visual changes, or flashes of light. ENMT: Negative for ear pain, hoarseness, nasal congestion, sinus pressure and sore throat. No headaches; tinnitus, drooling, or problem swallowing. Cardiovascular: Negative for chest pain, palpitations, diaphoresis, dyspnea and peripheral edema. ; No orthopnea, PND Respiratory: Negative for cough, hemoptysis, wheezing and stridor. No pleuritic chestpain. Gastrointestinal: Negative for nausea, vomiting, diarrhea, constipation, abdominal pain, melena, blood in stool, hematemesis, jaundice and rectal bleeding.    Genitourinary: Negative for frequency, dysuria, incontinence,flank pain and hematuria; Musculoskeletal: Negative for back pain and neck pain. Negative for swelling and trauma.;  Skin: . Negative for pruritus, rash, abrasions, bruising and skin lesion.; ulcerations Neuro: Negative for headache, lightheadedness and neck stiffness. Negative for weakness, altered level of consciousness , altered mental status, extremity weakness, burning feet, involuntary movement, seizure and syncope.  Psych: negative for anxiety, depression, insomnia,  tearfulness, panic attacks, hallucinations, paranoia, suicidal or homicidal ideation    Past Medical History  Diagnosis Date  . H/O back injury   . MRSA (methicillin resistant Staphylococcus aureus)   . 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 MRSA bacteremia 04/2015    Past Surgical History  Procedure Laterality Date  . Skin biopsy    . Abdmoninal drain insertion    . Tee without cardioversion N/A 06/13/2015    Procedure: TRANSESOPHAGEAL ECHOCARDIOGRAM (TEE);  Surgeon: Wendall Stade, MD;  Location: [facility];  Service: Cardiovascular;  Laterality: N/A;    Medications:  HOME MEDS: Prior to Admission medications   Medication Sig Start Date End Date Taking? Authorizing Provider  anidulafungin in sodium chloride 0.9 % 100 mL Daily IV last dose 06/26/15. 06/19/15  Yes Lesle Chris Black, NP  DAPTOmycin in sodium chloride 0.9 % 100 mL Daily IV last dose 07/08/15. Weigh daily and pharmacy to adjust daily based on weight. 06/19/15  Yes Lesle Chris Black, NP  gabapentin (NEURONTIN) 300 MG capsule Take 1 capsule (300 mg total) by mouth 3 (three) times daily. 06/19/15  Yes Lesle Chris Black, NP  methocarbamol (ROBAXIN) 500 MG tablet Take 1 tablet (500 mg total) by mouth every 8 (eight) hours as needed for muscle spasms. 06/19/15  Yes Lesle Chris Black, NP  Oxycodone HCl 10 MG TABS Take 1 tablet (10 mg total) by mouth every 4 (four) hours as needed. 06/19/15  Yes Lesle Chris Black, NP  nicotine (NICODERM CQ - DOSED IN MG/24 HOURS) 21 mg/24hr patch Place 1 patch (21 mg total) onto the skin daily. Patient not taking: Reported on 06/25/2015 06/19/15   Gwenyth Bender, NP  polyethylene glycol [facility] / Ethelene Hal) packet Take 17 g by mouth 2 (two) times daily. May change to as needed once with regular bowel  movements. Patient not taking: Reported on 05/28/2015 05/23/15   Osvaldo Shipper, MD  traMADol (ULTRAM) 50 MG tablet Take 2 tablets (100 mg total) by mouth every 6 (six) hours. Patient not taking: Reported on 06/02/2015 05/23/15   Osvaldo Shipper, MD     Allergies:  Allergies  Allergen Reactions  . Vicodin [Hydrocodone-Acetaminophen] Hives and Itching  . Adhesive [Tape] Rash    Social History:   reports that he has been smoking Cigarettes.  He has a 10 pack-year smoking history. He has never used smokeless tobacco. He reports that he does not drink alcohol or use illicit drugs.  Family History: Family History  Problem Relation Age of Onset  . Cancer Father      Physical Exam: Filed Vitals:   06/25/15 1510 06/25/15 1843 06/25/15 2056  BP: 143/96  126/86  Pulse: 114  83  Temp: 98.1 F (36.7 C)  98.3 F (36.8 C)  TempSrc: Oral  Oral  Resp: 20    Height: 6' (1.829 m)    Weight: 81.647 kg (180 lb) 75.949 kg (167 lb 7 oz)   SpO2: 99%  99%   Blood pressure 126/86, pulse 83, temperature 98.3 F (36.8 C), temperature source Oral, resp. rate 20, height 6' (1.829 m), weight 75.949 kg (167 lb 7 oz), SpO2 99 %.  GEN:  Pleasant patient lying in the stretcher in no acute distress; cooperative with exam. PSYCH:  alert and oriented x4; does not appear anxious or depressed; affect is appropriate. HEENT: Mucous membranes pink and anicteric; PERRLA; EOM intact; no cervical lymphadenopathy nor thyromegaly or carotid bruit; no JVD; There were no stridor. Neck is very supple. Breasts:: Not examined CHEST WALL: No tenderness CHEST: Normal respiration, clear to auscultation bilaterally.  HEART: Regular rate and rhythm.  There are no murmur, rub, or gallops.   BACK: No kyphosis or scoliosis; no CVA tenderness ABDOMEN: soft and non-tender; no masses, no organomegaly, normal abdominal bowel sounds; no pannus; no intertriginous candida. There is no rebound and no distention. Rectal Exam: Not done EXTREMITIES: No bone or joint deformity; age-appropriate arthropathy of the hands and knees; no edema; no ulcerations.  There is no calf tenderness. Genitalia: not examined PULSES: 2+ and symmetric SKIN: Normal hydration no rash or  ulceration CNS: Cranial nerves 2-12 grossly intact no focal lateralizing neurologic deficit.  Speech is fluent; uvula elevated with phonation, facial symmetry and tongue midline. DTR are normal bilaterally, cerebella exam is intact, barbinski is negative and strengths are equaled bilaterally.  No sensory loss.   Labs on Admission:  Basic Metabolic Panel:  Recent Labs Lab 06/25/15 1544 06/26/15 0150  NA 139 136  K 3.8 3.7  CL 103 104  CO2 26 26  GLUCOSE 105* 94  BUN 14 16  CREATININE 1.53* 1.45*  CALCIUM 9.6 8.7*   Liver Function Tests:  Recent Labs Lab 06/26/15 0150  AST 14*  ALT 11*  ALKPHOS 154*  BILITOT 0.5  PROT 7.7  ALBUMIN 3.2*   No results for input(s): LIPASE, AMYLASE in the last 168 hours. No results for input(s): AMMONIA in the last 168 hours. CBC:  Recent Labs Lab 06/25/15 1544 06/26/15 0150  WBC 11.2* 8.5  NEUTROABS 8.8*  --   HGB 11.2* 9.6*  HCT 34.7* 30.2*  MCV 81.5 81.6  PLT 326 301    Radiological Exams on Admission: Dg Chest Portable 1 View  06/25/2015   CLINICAL DATA:  Generalized body aches and pain.  History of MRSA.  EXAM: PORTABLE CHEST -  1 VIEW  COMPARISON:  06/11/2015  FINDINGS: Right-sided PICC line in place with tip over the SVC. Lungs are adequately inflated without consolidation or effusion. Cardiomediastinal silhouette and remainder of the exam is unchanged.  IMPRESSION: No active disease.   Electronically Signed   By: Elberta Fortis M.D.   On: 06/25/2015 18:52   Assessment/Plan Present on Admission:  . Bacteremia . MRSA bacteremia . Iliopsoas abscess . Intractable pain . Candidemia . Acute kidney injury (nontraumatic)  PLAN:  Continue with IV antibiotics, discharge to home when CM would be able to help set up with [facility].  He also would need a PCP for follow up as well.  He is requesting pain meds, and I will Rx with Percocet 2 tablets every 4 hours PRN, and for break thru, will give Dilaudid IV  every 4 hours PRN.  He is stable.    Other plans as per orders.  Code Status: FULL Unk Lightning, MD. [facility] Pager [phone] 7pm to 7am.  06/26/2015, 11:19 AM

## 2015-06-27 MED ORDER — SODIUM CHLORIDE 0.9 % IV SOLN: 500.0000 mg | INTRAVENOUS | Status: DC

## 2015-06-27 MED ORDER — OXYCODONE HCL 10 MG PO TABS: 10.0000 mg | ORAL_TABLET | ORAL | Status: DC | PRN

## 2015-06-27 MED ORDER — SODIUM CHLORIDE 0.9 % IV SOLN: 100.0000 mg | INTRAVENOUS | Status: DC

## 2015-06-27 NOTE — Care Management Note (Signed)
Case Management Note  Patient Details  Name: FAIZAN GERACI MRN: [id number] Date of Birth: 1976-03-11  Subjective/Objective:                    Action/Plan:   Expected Discharge Date:  07/01/15               Expected Discharge Plan:  Home w Home Health Services  In-House Referral:  NA  Discharge planning Services  CM Consult  Post Acute Care Choice:  Home Health Choice offered to:  Patient  DME Arranged:    DME Agency:     HH Arranged:  RN, IV Antibiotics HH Agency:   ([facility])  Status of Service:  Completed, signed off  Medicare Important Message Given:    Date Medicare IM Given:    Medicare IM give by:    Date Additional Medicare IM Given:    Additional Medicare Important Message give by:     If discussed at Long Length of Stay Meetings, dates discussed:    Additional Comments: Pt discharged home today with [facility] services. All information and orders faxed to [facility] pharmacy. Start of care will be this pm. Pt is aware of home infusion services . Pt given contact information for [facility]. Pt also given numbers of Dr. Karilyn Cota and Dr. Felecia Shelling to call for new PCP care. Pt refuses to see previous PCP Dr. Hoyt Koch again. Cm did try numerous other PCP offices and they are unable to take pt at this time. Pt is very aggressive with Cm at this time and feels the hospital is "rushing him home today". No further CM needs noted. Pt and pts nurse aware of discharge arrangements. Arlyss Queen [place], RN 06/27/2015, 11:11 AM

## 2015-06-27 NOTE — Discharge Summary (Signed)
Physician Discharge Summary  [name] VQQ:[id number] DOB: 04-28-1976 DOA: 06/25/2015  PCP: No PCP Per Patient  Admit date: 06/25/2015 Discharge date: 06/27/2015  Time spent: 35 minutes  Recommendations for Outpatient Follow-up:  1. Follow up with ID clinic that was scheduled for patient. 2. Follow up with a PCP of patient's choice for general medical care.   Discharge Diagnoses:  Active Problems:   Iliopsoas abscess   MRSA bacteremia   Acute kidney injury (nontraumatic)   Intractable pain   Candidemia   Bacteremia   Discharge Condition:  Stable.  Diet recommendation: healthy.   Filed Weights   06/25/15 1510 06/25/15 1843 06/27/15 0612  Weight: 81.647 kg (180 lb) 75.949 kg (167 lb 7 oz) 81.466 kg (179 lb 9.6 oz)    History of present illness:  Patient was admitted into the hospital for setting up IV antibiotics regimen since previous set up was not successful, by Dr Sibyl Parr on August 3rd, 2016.  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 Patient was recently discharged from the hospital on 06/19/15 after he was diagnosed with ileopsoas abscess, MRSA bacteremia and candidemia. Patient was discharged on daptomycin and anti-fungal Eraxis. As patient did not have home has improved he was supposed to come to the ED daily for getting IV antibiotic. Patient only came one day and did not get IV antibiotic for last 7 days. Now his complaint of worsening back pain, chills, subjective fever. Lab work in the ED reveals no significant abnormality. Patient again started on daptomycin and anti-fungal Eraxis. He denies chest pain, no shortness of breath, no nausea no vomiting no diarrhea. No dysuria urgency frequency of urination. Patient says that he still has left-sided lower back pain and rates 9/10 intensity.   HOSPITAL COURSE:  Patient  was admitted into the hospital, and his Daptomycin and Eraxis were resumed.  He told me since he had no money, it was difficult for him to come to the ER for his IV antibiotics, as he had to pay his friend to take him there.  After the first dose, he did not come back for a week.  He complained of pain, and requested changes in his narcotic regimen.  He now qualified for Medicaid, and case manager had been working to set up for home health and continuation of his antibiotics at home.  He will need to continue the antifungal med for another 7 days, along with completing the IV Daptomycin until 07/12/15.  He was recommended to find a PCP of his choice, and follow up for his general medical care.  CM had attempted to help with several local PCPs, but unable.  I will provide him with 30 tablets of oxycodone 10mg , but unwilling to provide more narcotics than this.  Thank you and Good day.   Consultations:  None.   Discharge Exam: Filed Vitals:   06/27/15 0612  BP: 128/95  Pulse: 87  Temp: 98.1 F (36.7 C)  Resp: 18   Discharge Instructions   Discharge Instructions    Diet - low sodium heart healthy    Complete by:  As directed      Discharge instructions    Complete by:  As directed   Follow up with ID as directed.   Contact heatlh department or hospital for list of PCPs and follow up as your PCP of  choice.     Increase activity slowly    Complete by:  As directed           Current Discharge Medication List    CONTINUE these medications which have CHANGED   Details  anidulafungin 100 mg in sodium chloride 0.9 % 100 mL Inject 100 mg into the vein daily. Qty: 7 Dose, Refills: 0    DAPTOmycin 500 mg in sodium chloride 0.9 % 100 mL Inject 500 mg into the vein daily. Qty: 11 Dose, Refills: 0    oxyCODONE 10 MG TABS Take 1 tablet (10 mg total) by mouth every 4 (four) hours as needed for severe pain. Qty: 30 tablet, Refills: 0      CONTINUE these medications which have NOT CHANGED    Details  gabapentin (NEURONTIN) 300 MG capsule Take 1 capsule (300 mg total) by mouth 3 (three) times daily. Qty: 90 capsule, Refills: 3   Associated Diagnoses: Pain; Chronic low back pain    nicotine (NICODERM CQ - DOSED IN MG/24 HOURS) 21 mg/24hr patch Place 1 patch (21 mg total) onto the skin daily. Qty: 28 patch, Refills: 0    polyethylene glycol (MIRALAX / GLYCOLAX) packet Take 17 g by mouth 2 (two) times daily. May change to as needed once with regular bowel movements. Qty: 60 each, Refills: 0      STOP taking these medications     methocarbamol (ROBAXIN) 500 MG tablet      traMADol (ULTRAM) 50 MG tablet        Allergies  Allergen Reactions  . Vicodin [Hydrocodone-Acetaminophen] Hives and Itching  . Adhesive [Tape] Rash   Follow-up Information    Follow up with [facility]              On 07/01/2015.   Contact information:   [address] [place] [address]        The results of significant diagnostics from this hospitalization (including imaging, microbiology, ancillary and laboratory) are listed below for reference.    Significant Diagnostic Studies: Mr Lumbar Spine W Wo Contrast  06/11/2015   CLINICAL DATA:  Fever. Continued low back pain. History of abscess drainage.  EXAM: MRI LUMBAR SPINE WITHOUT AND WITH CONTRAST  TECHNIQUE: Multiplanar and multiecho pulse sequences of the lumbar spine were obtained without and with intravenous contrast.  CONTRAST:  15mL MULTIHANCE GADOBENATE DIMEGLUMINE 529 MG/ML IV SOLN  COMPARISON:  06/02/2015 lumbar spine MRI. 06/09/2015 CT abdomen and pelvis.  FINDINGS: Vertebral alignment is unchanged with slight retrolisthesis of L5 on S1. Vertebral body heights are preserved. Disc desiccation and mild disc space narrowing are present at L4-5 and L5-S1, similar to prior. No vertebral marrow edema is seen in the lumbar spine. Conus medullaris is normal in signal and terminates at L1.   T11-12: Only imaged sagittally. Mild disc bulging results in at most mild spinal canal and neural foraminal narrowing, unchanged.  T12-L1:  Only imaged sagittally.  No disc herniation or stenosis.  L1-2 through L3-4:  Negative.  L4-5: Mild circumferential disc bulging, small superimposed central disc protrusion, and mild facet hypertrophy result in minimal spinal canal narrowing and minimal bilateral neural foraminal narrowing, unchanged. Minimal residual dorsal epidural enhancement at the site of prior phlegmon has decreased from prior.  L5-S1: Mild circumferential disc bulging, broad central disc protrusion, and mild facet hypertrophy result in mild right greater than left lateral recess stenosis and mild bilateral neural foraminal stenosis, unchanged. No spinal stenosis.  Marrow edema about the left SI joint involving the left sacral ala and iliac bone does not appear significantly different than on the prior MRI. Erosive changes and fluid are again noted in the left SI joint. Edema is again seen in the soft tissues surrounding the left SI joint including the iliopsoas and gluteal musculature, lower lumbar paraspinal musculature, and other surrounding soft tissues.  Fluid collection at the superior aspect of the left iliacus muscle has enlarged from the prior MRI, now measuring 2.1 x 1.9 cm (previously 1.4 x 0.8 cm). Fluid tracks inferiorly from this collection along the anterior aspect of the left SI joint, and there is a new 1.2 cm fluid collection just superior to the left pelvic sidewall posterior to the left external iliac vein (series 7, image 49). Extensive surrounding phlegmon is again seen about the left iliopsoas musculature. Trace free fluid is noted in the right mid abdomen near the tip of the liver, grossly stable to minimally increased compared to that on prior CT.  IMPRESSION: 1. Persistent extensive marrow and soft tissue edema about the left SI joint consistent with septic arthritis and  surrounding myositis and soft tissue phlegmon. 2. 2.1 cm fluid collection/abscess at the superior aspect of the left iliacus, larger than on prior MRI. 3. New 1.2 cm collection just superior to the left pelvic sidewall. 4. No evidence of new infection in the lumbar spine. Minimal residual dorsal epidural enhancement at L4-5.   Electronically Signed   By: Sebastian Ache   On: 06/11/2015 14:01   Mr Lumbar Spine W Wo Contrast  06/02/2015   CLINICAL DATA:  39 year old male with severe lumbar back pain radiating to the left hip and lower extremity. Recent left iliopsoas abscess and pelvic abscess drainage. Subsequent encounter.  EXAM: MRI LUMBAR SPINE WITHOUT AND WITH CONTRAST  TECHNIQUE: Multiplanar and multiecho pulse sequences of the lumbar spine were obtained without and with intravenous contrast.  CONTRAST:  15mL MULTIHANCE GADOBENATE DIMEGLUMINE 529 MG/ML IV SOLN  COMPARISON:  CT Abdomen and Pelvis 05/22/2015. Lumbar MRI 05/15/2015.  FINDINGS: Same numbering system as on 05/15/2015 designating normal lumbar segmentation. Stable lumbar and visualized lower thoracic vertebral height and alignment. No lumbar or lower thoracic marrow edema identified.  T12-L1 disc space loss with circumferential disc bulge appears stable. Mild facet hypertrophy also at that level. Up to mild multifactorial spinal and T11 foraminal stenosis appears stable. No signal abnormality identified in the adjacent lower thoracic spinal cord. The conus medullaris appears normal at L1.  The L1-L2 through L3-L4 disc spaces remain within normal limits.  Resolved posterior L4-L5 and L5-S1 epidural phlegmon/ inflammation. Improved thecal sac patency at both levels. Underlying L4-L5 and L5-S1 disc and facet degeneration appears stable.  Abnormal T2 and STIR signal surrounding the left SI joint and involving the left sacral ala, medial left iliac bone, an the surrounding anterior and posterior soft tissues including the visible iliopsoas muscle and left  gluteal muscles, persists but appears mildly regressed since June. Associated abnormal postcontrast enhancement of these bony and soft tissues. Trace presacral fluid today. Percutaneous appears stable in position. Left iliopsoas drainage catheter re- identified. Minimal fluid surrounding the catheter. 2-3 cm cephalad of the catheter there is a small 14 mm iliopsoas fluid collection (series 5, image 33).  Visualized abdominal soft tissues are within normal limits.  IMPRESSION: 1. Resolved dorsal lower lumbar epidural phlegmon since 05/15/2015. No acute findings in the lumbar spine. 2. 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 3. Percutaneous left iliopsoas drainage catheter in place with minimal fluid adjacent to the catheter, and small 14 mm fluid collection a short distance cephalad to the catheter.   Electronically Signed   By: Odessa Fleming M.D.   On: 06/02/2015 18:51   Ct Abdomen Pelvis W Contrast  06/09/2015   CLINICAL DATA:  39 year old male with history of left psoas abscess and drainage presenting with abdominal pain  EXAM: CT ABDOMEN AND PELVIS WITH CONTRAST  TECHNIQUE: Multidetector CT imaging of the abdomen and pelvis was performed using the standard protocol following bolus administration of intravenous contrast.  CONTRAST:  OMNIPAQUE IOHEXOL 300 MG/ML  SOLN  COMPARISON:  CT dated 05/22/2015 and 06/02/2015  FINDINGS: The visualized lung bases are clear. No intra-abdominal free air or free fluid.  Trace pericholecystic fluid may be present. There is mild periportal edema. The liver is otherwise unremarkable. No gallstone identified. The pancreas appears unremarkable. Splenomegaly measuring up to 16 cm. The adrenal glands, kidneys, visualized ureters and urinary bladder appear unremarkable. The prostate and seminal vesicles are grossly unremarkable.  Moderate stool throughout the  colon. No evidence of bowel obstruction or inflammation. Normal appendix.  The visualized abdominal aorta and IVC are patent. No portal venous gas identified. There is no tendinopathy. Is there is diffuse haziness of the retroperitoneal and periaortic fat planes.  There is inflammatory changes of the left psoas and iliacus muscles. there has been interval removal of the left pelvic wall pigtail drainage catheter. A 1.3 x 1.0 cm hypodense focus noted in the left iliacus muscle (series 201, image 62) may represent residual fluid. There is overall interval improvement of the inflammatory changes of the left pelvic sidewall. No drainable collection/abscess identified.  There is irregularity of the left sacroiliac joint compatible with sacroiliitis which is new compared to the study dated 05/22/2015 and progressed since the study 06/02/2015. There is degenerative changes of the spine. No acute fracture. Mild diffuse subcutaneous soft tissue stranding noted.  IMPRESSION: Interval abdominal of the left pelvic side hole drainage catheter with overall improvement of the inflammatory changes of the left psoas and iliacus muscles. Small stable hypodense focus in the superior aspect of the left iliacus muscle likely represents residual fluid. No drainable fluid collection/ abscess identified.  Irregularity of the left SI joint, with interval progression compared the prior study concerning for sacroiliitis. Clinical correlation is recommended.   Electronically Signed   By: Elgie Collard M.D.   On: 06/09/2015 22:33   Ct Abdomen Pelvis W Contrast  06/02/2015   CLINICAL DATA:  39 year old male with history of iliopsoas abscess, s/p JP drain placement. Pain at site of insertion since 05/30/2015. History of MRSA.  EXAM: CT ABDOMEN AND PELVIS WITH CONTRAST  TECHNIQUE: Multidetector CT imaging of the abdomen and pelvis was performed using the standard protocol following bolus administration of intravenous contrast.  CONTRAST:   50mL OMNIPAQUE IOHEXOL 300 MG/ML SOLN, OMNIPAQUE IOHEXOL 300 MG/ML SOLN  COMPARISON:  CT of the abdomen and pelvis 05/22/2015.  FINDINGS: Lower chest:  Unremarkable.  Hepatobiliary: No cystic or solid hepatic lesions. No intra or extrahepatic biliary ductal dilatation. Gallbladder is normal in appearance.  Pancreas: No pancreatic mass. No pancreatic ductal dilatation. No pancreatic or peripancreatic fluid or inflammatory changes.  Spleen: Multiple small calcified granulomas in the spleen.  Adrenals/Urinary Tract: Bilateral adrenal glands and bilateral kidneys are normal in appearance. No hydroureteronephrosis or perinephric stranding to indicate urinary  tract obstruction at this time. Urinary bladder is normal in appearance.  Stomach/Bowel: Stomach is normal in appearance. No pathologic dilatation of small bowel or colon. Normal appendix.  Vascular/Lymphatic: Atherosclerosis throughout the abdominal and pelvic vasculature, without evidence of aneurysm or dissection. Numerous prominent but nonenlarged retroperitoneal lymph nodes are noted, which are nonspecific, likely reactive.  Reproductive: Prostate gland and seminal vesicles are unremarkable in appearance.  Other: Again noted are 2 pigtail drainage catheters in place in the left iliopsoas musculature, and along the left pelvic sidewall. Previously noted fluid collections in these regions appear to have completely resolved. There is a tiny focus of low attenuation in the superior aspect of the left iliacus muscle (image 45 of series 7), which measures approximately 8 mm in greatest length, slightly smaller than the prior examination, which could represent an additional small abscess, or may represent an intramuscular seroma or resolving chronic hematoma. No significant volume of ascites. No pneumoperitoneum.  Musculoskeletal: There are no aggressive appearing lytic or blastic lesions noted in the visualized portions of the skeleton.  IMPRESSION: 1.  Left-sided pigtail drainage catheters remain in position in the left iliopsoas musculature and along the left pelvic sidewall, with essentially complete resolution of previously noted abscesses. Separate from the drained collections there is an 8 mm focus in the cephalad aspect of the left iliacus musculature immediately lateral to the superior aspect of the left sacroiliac joint, which may represent an additional small abscess (if so, this is smaller than the prior study), or a resolving intramuscular seroma or chronic hematoma. 2. Additional incidental findings, as above.   Electronically Signed   By: Trudie Reed M.D.   On: 06/02/2015 19:12   US Renal  06/12/2015   CLINICAL DATA:  39 year old male with hematuria and acute renal failure  EXAM: RENAL / URINARY TRACT ULTRASOUND COMPLETE  COMPARISON:  CT scan abdomen/pelvis 06/09/2015 ; MRI lumbar spine 06/11/2015  FINDINGS: Right Kidney:  Length: 14.5 cm. Echogenic renal parenchyma with increased conspicuity of the corticomedullary junctions. No mass or hydronephrosis visualized.  Left Kidney:  Length: 14.2 cm. Echogenic renal parenchyma with increased conspicuity of the corticomedullary junctions. No mass or hydronephrosis visualized.  Bladder:  Appears normal for degree of bladder distention. Both ureteral jets are visualized.  IMPRESSION: 1. No evidence of hydronephrosis. 2. Echogenic renal parenchyma bilaterally suggests underlying medical renal disease.   Electronically Signed   By: Malachy Moan M.D.   On: 06/12/2015 10:16   Ct Aspiration  06/13/2015   CLINICAL DATA:  History of left psoas abscess, post catheter drainage. Septic left sacroiliac joint arthritis with myositis and phlegmon in the left iliopsoas region, and a small central 2 cm fluid component on recent MR.  EXAM: CT GUIDED ASPIRATION BIOPSY OF LEFT ILIOPSOAS ABSCESS  ANESTHESIA/SEDATION: Intravenous Fentanyl and Versed were administered as conscious sedation during continuous  cardiorespiratory monitoring by the radiology RN, with a total moderate sedation time of 14 minutes.  PROCEDURE: The procedure risks, benefits, and alternatives were explained to the patient. Questions regarding the procedure were encouraged and answered. The patient understands and consents to the procedure.  Select axial scans through the pelvis were obtained. The fluid collection seen on MR was localized and an appropriate skin entry site was determined and marked.  The operative field was prepped with Betadinein a sterile fashion, and a sterile drape was applied covering the operative field. A sterile gown and sterile gloves were used for the procedure. Local anesthesia was provided with 1% Lidocaine.  Under  CT fluoroscopic guidance, an 18 gauge trocar needle was advanced into the collection. Approximately 5 mL of cloudy yellow fluid were aspirated, sent for Gram stain, culture and sensitivity. Postprocedure scans show no hemorrhage or other apparent complication. The patient tolerated the procedure well.  COMPLICATIONS: None immediate  FINDINGS: Extensive phlegmonous change in the left iliopsoas region. The fluid collection seen on MR is identified, and 5 mL thin cloudy fluid aspirated under CT guidance. Sample sent for Gram stain, culture and sensitivity.  IMPRESSION: 1. Technically successful aspiration of small left iliopsoas abscess under CT guidance.   Electronically Signed   By: Corlis Leak M.D.   On: 06/13/2015 11:03   Dg Chest Portable 1 View  06/25/2015   CLINICAL DATA:  Generalized body aches and pain.  History of MRSA.  EXAM: PORTABLE CHEST - 1 VIEW  COMPARISON:  06/11/2015  FINDINGS: Right-sided PICC line in place with tip over the SVC. Lungs are adequately inflated without consolidation or effusion. Cardiomediastinal silhouette and remainder of the exam is unchanged.  IMPRESSION: No active disease.   Electronically Signed   By: Elberta Fortis M.D.   On: 06/25/2015 18:52   Dg Chest Port 1  View  06/11/2015   CLINICAL DATA:  Acute onset of nausea, upper back pain and headache. Initial encounter.  EXAM: PORTABLE CHEST - 1 VIEW  COMPARISON:  Chest radiograph performed 05/15/2015  FINDINGS: The lungs are well-aerated. Mild bibasilar opacities may reflect atelectasis or possibly mild pneumonia. There is no evidence of pleural effusion or pneumothorax.  The cardiomediastinal silhouette is within normal limits. No acute osseous abnormalities are seen. A left PICC is noted ending about the cavoatrial junction.  IMPRESSION: Mild bibasilar airspace opacities may reflect atelectasis or possibly mild pneumonia.   Electronically Signed   By: Roanna Raider M.D.   On: 06/11/2015 04:50    Microbiology: Recent Results (from the past 240 hour(s))  Blood Culture (routine x 2)     Status: None (Preliminary result)   Collection Time: 06/25/15  7:10 PM  Result Value Ref Range Status   Specimen Description BLOOD LEFT HAND DRAWN BY RN  Final   Special Requests BOTTLES DRAWN AEROBIC AND ANAEROBIC 6CC  Final   Culture NO GROWTH < 24 HOURS  Final   Report Status PENDING  Incomplete  Fungus culture, blood     Status: None (Preliminary result)   Collection Time: 06/25/15  7:35 PM  Result Value Ref Range Status   Specimen Description BLOOD LEFT ARM  Final   Special Requests BOTTLES DRAWN AEROBIC AND ANAEROBIC 6CC  Final   Culture PENDING  Incomplete   Report Status PENDING  Incomplete  MRSA PCR Screening     Status: None   Collection Time: 06/26/15 10:55 AM  Result Value Ref Range Status   MRSA by PCR NEGATIVE NEGATIVE Final    Comment:        The GeneXpert MRSA Assay (FDA approved for NASAL specimens only), is one component of a comprehensive MRSA colonization surveillance program. It is not intended to diagnose MRSA infection nor to guide or monitor treatment for MRSA infections.      Labs: Basic Metabolic Panel:  Recent Labs Lab 06/25/15 1544 06/26/15 0150  NA 139 136  K 3.8 3.7   CL 103 104  CO2 26 26  GLUCOSE 105* 94  BUN 14 16  CREATININE 1.53* 1.45*  CALCIUM 9.6 8.7*   Liver Function Tests:  Recent Labs Lab 06/26/15 0150  AST 14*  ALT 11*  ALKPHOS 154*  BILITOT 0.5  PROT 7.7  ALBUMIN 3.2*   No results for input(s): LIPASE, AMYLASE in the last 168 hours. No results for input(s): AMMONIA in the last 168 hours. CBC:  Recent Labs Lab 06/25/15 1544 06/26/15 0150  WBC 11.2* 8.5  NEUTROABS 8.8*  --   HGB 11.2* 9.6*  HCT 34.7* 30.2*  MCV 81.5 81.6  PLT 326 301   Signed:  Ginnifer Creelman  [facility] 06/27/2015, 10:41 AM

## 2015-06-27 NOTE — Progress Notes (Signed)
Reviewed pt discharge instructions with pt, answered questions at this time.  Provided pt with Primary Care Doctors information for followup.  Telemetry removed, pt will discharge with PICC line.

## 2015-06-30 LAB — CULTURE, BLOOD (ROUTINE X 2): CULTURE: NO GROWTH

## 2015-07-02 LAB — FUNGUS CULTURE, BLOOD: CULTURE: NO GROWTH

## 2015-07-03 ENCOUNTER — Telehealth: Payer: Self-pay | Admitting: Licensed Clinical Social Worker

## 2015-07-03 NOTE — Telephone Encounter (Signed)
[place], RN with [facility] has called stating that the patient has left the state with the PICC line in his arm, family will not disclose where he is. [facility] will not send anymore medication out to the home. Medication was accepted by the Aunt today before nursing found out that the patient had left the state. Nursing has tried calling other contacts and the number for the patient without any luck.

## 2015-07-11 ENCOUNTER — Emergency Department (HOSPITAL_COMMUNITY): Payer: Medicaid Other

## 2015-07-11 ENCOUNTER — Inpatient Hospital Stay (HOSPITAL_COMMUNITY)
Admission: EM | Admit: 2015-07-11 | Discharge: 2015-07-15 | DRG: 871 | Disposition: A | Discharge status: Home / Self Care | Payer: Medicaid Other | Attending: Internal Medicine | Admitting: Internal Medicine

## 2015-07-11 ENCOUNTER — Encounter (HOSPITAL_COMMUNITY): Payer: Self-pay | Admitting: Emergency Medicine

## 2015-07-11 ENCOUNTER — Inpatient Hospital Stay (HOSPITAL_COMMUNITY): Payer: Medicaid Other

## 2015-07-11 DIAGNOSIS — R319 Hematuria, unspecified: Secondary | ICD-10-CM | POA: Diagnosis present

## 2015-07-11 DIAGNOSIS — D696 Thrombocytopenia, unspecified: Secondary | ICD-10-CM

## 2015-07-11 DIAGNOSIS — R509 Fever, unspecified: Secondary | ICD-10-CM | POA: Diagnosis present

## 2015-07-11 DIAGNOSIS — B9562 Methicillin resistant Staphylococcus aureus infection as the cause of diseases classified elsewhere: Secondary | ICD-10-CM | POA: Diagnosis present

## 2015-07-11 DIAGNOSIS — R7881 Bacteremia: Secondary | ICD-10-CM | POA: Diagnosis not present

## 2015-07-11 DIAGNOSIS — Z8614 Personal history of Methicillin resistant Staphylococcus aureus infection: Secondary | ICD-10-CM

## 2015-07-11 DIAGNOSIS — N029 Recurrent and persistent hematuria with unspecified morphologic changes: Secondary | ICD-10-CM | POA: Diagnosis present

## 2015-07-11 DIAGNOSIS — E875 Hyperkalemia: Secondary | ICD-10-CM | POA: Diagnosis present

## 2015-07-11 DIAGNOSIS — A4102 Sepsis due to Methicillin resistant Staphylococcus aureus: Secondary | ICD-10-CM

## 2015-07-11 DIAGNOSIS — M545 Low back pain, unspecified: Secondary | ICD-10-CM

## 2015-07-11 DIAGNOSIS — Z809 Family history of malignant neoplasm, unspecified: Secondary | ICD-10-CM | POA: Diagnosis not present

## 2015-07-11 DIAGNOSIS — A419 Sepsis, unspecified organism: Secondary | ICD-10-CM | POA: Diagnosis not present

## 2015-07-11 DIAGNOSIS — G8929 Other chronic pain: Secondary | ICD-10-CM | POA: Diagnosis present

## 2015-07-11 DIAGNOSIS — Z9119 Patient's noncompliance with other medical treatment and regimen: Secondary | ICD-10-CM | POA: Diagnosis present

## 2015-07-11 DIAGNOSIS — R6521 Severe sepsis with septic shock: Secondary | ICD-10-CM | POA: Diagnosis present

## 2015-07-11 DIAGNOSIS — F17210 Nicotine dependence, cigarettes, uncomplicated: Secondary | ICD-10-CM | POA: Diagnosis present

## 2015-07-11 DIAGNOSIS — D638 Anemia in other chronic diseases classified elsewhere: Secondary | ICD-10-CM | POA: Diagnosis present

## 2015-07-11 DIAGNOSIS — B377 Candidal sepsis: Secondary | ICD-10-CM | POA: Diagnosis not present

## 2015-07-11 DIAGNOSIS — N179 Acute kidney failure, unspecified: Secondary | ICD-10-CM | POA: Diagnosis present

## 2015-07-11 DIAGNOSIS — Z7289 Other problems related to lifestyle: Secondary | ICD-10-CM

## 2015-07-11 DIAGNOSIS — R52 Pain, unspecified: Secondary | ICD-10-CM

## 2015-07-11 DIAGNOSIS — D6959 Other secondary thrombocytopenia: Secondary | ICD-10-CM | POA: Diagnosis present

## 2015-07-11 DIAGNOSIS — Z9114 Patient's other noncompliance with medication regimen: Secondary | ICD-10-CM | POA: Diagnosis present

## 2015-07-11 DIAGNOSIS — K6812 Psoas muscle abscess: Secondary | ICD-10-CM | POA: Diagnosis present

## 2015-07-11 DIAGNOSIS — E876 Hypokalemia: Secondary | ICD-10-CM | POA: Diagnosis present

## 2015-07-11 DIAGNOSIS — N170 Acute kidney failure with tubular necrosis: Secondary | ICD-10-CM | POA: Diagnosis present

## 2015-07-11 LAB — COMPREHENSIVE METABOLIC PANEL
ALT: 80 U/L — ABNORMAL HIGH (ref 17–63)
AST: 165 U/L — ABNORMAL HIGH (ref 15–41)
Albumin: 4.4 g/dL (ref 3.5–5.0)
Alkaline Phosphatase: 241 U/L — ABNORMAL HIGH (ref 38–126)
Anion gap: 11 (ref 5–15)
BUN: 32 mg/dL — ABNORMAL HIGH (ref 6–20)
CALCIUM: 9.3 mg/dL (ref 8.9–10.3)
CO2: 24 mmol/L (ref 22–32)
Chloride: 100 mmol/L — ABNORMAL LOW (ref 101–111)
Creatinine, Ser: 2.06 mg/dL — ABNORMAL HIGH (ref 0.61–1.24)
GFR calc non Af Amer: 40 mL/min — ABNORMAL LOW (ref >60)
GFR, EST AFRICAN AMERICAN: 46 mL/min — AB (ref >60)
Glucose, Bld: 106 mg/dL — ABNORMAL HIGH (ref 65–99)
Potassium: 4.4 mmol/L (ref 3.5–5.1)
Sodium: 135 mmol/L (ref 135–145)
Total Bilirubin: 1.3 mg/dL — ABNORMAL HIGH (ref 0.3–1.2)
Total Protein: 9.4 g/dL — ABNORMAL HIGH (ref 6.5–8.1)

## 2015-07-11 LAB — CBC WITH DIFFERENTIAL/PLATELET
Basophils Absolute: 0 10*3/uL (ref 0.0–0.1)
Basophils Relative: 0 % (ref 0–1)
Eosinophils Absolute: 0 10*3/uL (ref 0.0–0.7)
Eosinophils Relative: 0 % (ref 0–5)
HCT: 32.1 % — ABNORMAL LOW (ref 39.0–52.0)
HEMOGLOBIN: 10.4 g/dL — AB (ref 13.0–17.0)
LYMPHS ABS: 0.6 10*3/uL — AB (ref 0.7–4.0)
Lymphocytes Relative: 2 % — ABNORMAL LOW (ref 12–46)
MCH: 26.7 pg (ref 26.0–34.0)
MCHC: 32.4 g/dL (ref 30.0–36.0)
MCV: 82.3 fL (ref 78.0–100.0)
Monocytes Absolute: 0.4 10*3/uL (ref 0.1–1.0)
Monocytes Relative: 2 % — ABNORMAL LOW (ref 3–12)
NEUTROS PCT: 96 % — AB (ref 43–77)
Neutro Abs: 25.3 10*3/uL — ABNORMAL HIGH (ref 1.7–7.7)
Platelets: 213 10*3/uL (ref 150–400)
RBC: 3.9 MIL/uL — AB (ref 4.22–5.81)
RDW: 15.1 % (ref 11.5–15.5)
WBC: 26.3 10*3/uL — AB (ref 4.0–10.5)

## 2015-07-11 LAB — TROPONIN I: Troponin I: <0.03 ng/mL (ref <0.031)

## 2015-07-11 LAB — I-STAT CG4 LACTIC ACID, ED: LACTIC ACID, VENOUS: 3.08 mmol/L — AB (ref 0.5–2.0)

## 2015-07-11 MED ORDER — VANCOMYCIN HCL IN DEXTROSE 1-5 GM/200ML-% IV SOLN: 1000.0000 mg | Freq: Once | INTRAVENOUS | Status: DC

## 2015-07-11 MED ORDER — SODIUM CHLORIDE 0.9 % IV SOLN
INTRAVENOUS | Status: DC
  Administered 2015-07-12 – 2015-07-14 (×4): via INTRAVENOUS

## 2015-07-11 MED ORDER — SENNOSIDES-DOCUSATE SODIUM 8.6-50 MG PO TABS
1.0000 | ORAL_TABLET | Freq: Every evening | ORAL | Status: DC | PRN
Start: 2015-07-11 — End: 2015-07-15

## 2015-07-11 MED ORDER — GABAPENTIN 300 MG PO CAPS
300.0000 mg | ORAL_CAPSULE | Freq: Three times a day (TID) | ORAL | Status: DC
  Administered 2015-07-12 – 2015-07-15 (×9): 300 mg via ORAL
  Filled 2015-07-11 (×5): qty 1
  Filled 2015-07-11: qty 3
  Filled 2015-07-11 (×5): qty 1

## 2015-07-11 MED ORDER — SODIUM CHLORIDE 0.9 % IV SOLN
500.0000 mg | INTRAVENOUS | Status: DC
  Administered 2015-07-11 – 2015-07-14 (×4): 500 mg via INTRAVENOUS
  Filled 2015-07-11 (×5): qty 10

## 2015-07-11 MED ORDER — SODIUM CHLORIDE 0.9 % IV SOLN
100.0000 mg | INTRAVENOUS | Status: DC
  Administered 2015-07-11 – 2015-07-13 (×3): 100 mg via INTRAVENOUS
  Filled 2015-07-11 (×4): qty 100

## 2015-07-11 MED ORDER — ONDANSETRON HCL 4 MG PO TABS: 4.0000 mg | ORAL_TABLET | Freq: Four times a day (QID) | ORAL | Status: DC | PRN

## 2015-07-11 MED ORDER — DAPTOMYCIN 500 MG IV SOLR
INTRAVENOUS | Status: AC
  Filled 2015-07-11: qty 10

## 2015-07-11 MED ORDER — SODIUM CHLORIDE 0.9 % IV BOLUS (SEPSIS)
2500.0000 mL | Freq: Once | INTRAVENOUS | Status: AC
  Administered 2015-07-11: 2000 mL via INTRAVENOUS

## 2015-07-11 MED ORDER — ONDANSETRON HCL 4 MG/2ML IJ SOLN
4.0000 mg | Freq: Once | INTRAMUSCULAR | Status: AC
Start: 2015-07-11 — End: 2015-07-11
  Administered 2015-07-11: 4 mg via INTRAVENOUS
  Filled 2015-07-11: qty 2

## 2015-07-11 MED ORDER — ONDANSETRON HCL 4 MG/2ML IJ SOLN
4.0000 mg | Freq: Four times a day (QID) | INTRAMUSCULAR | Status: DC | PRN
  Administered 2015-07-12 – 2015-07-13 (×2): 4 mg via INTRAVENOUS
  Filled 2015-07-11 (×2): qty 2

## 2015-07-11 MED ORDER — KETOROLAC TROMETHAMINE 30 MG/ML IJ SOLN
30.0000 mg | Freq: Once | INTRAMUSCULAR | Status: AC
Start: 2015-07-11 — End: 2015-07-11
  Administered 2015-07-11: 30 mg via INTRAVENOUS
  Filled 2015-07-11: qty 1

## 2015-07-11 MED ORDER — HEPARIN SODIUM (PORCINE) 5000 UNIT/ML IJ SOLN
5000.0000 [IU] | Freq: Three times a day (TID) | INTRAMUSCULAR | Status: DC
  Administered 2015-07-12 (×2): 5000 [IU] via SUBCUTANEOUS
  Filled 2015-07-11 (×2): qty 1

## 2015-07-11 MED ORDER — ACETAMINOPHEN 650 MG RE SUPP: 650.0000 mg | Freq: Four times a day (QID) | RECTAL | Status: DC | PRN

## 2015-07-11 MED ORDER — KETOROLAC TROMETHAMINE 15 MG/ML IJ SOLN
15.0000 mg | Freq: Three times a day (TID) | INTRAMUSCULAR | Status: DC | PRN
  Administered 2015-07-12: 15 mg via INTRAVENOUS
  Filled 2015-07-11: qty 1

## 2015-07-11 MED ORDER — ACETAMINOPHEN 325 MG PO TABS
650.0000 mg | ORAL_TABLET | Freq: Four times a day (QID) | ORAL | Status: DC | PRN
  Administered 2015-07-12 – 2015-07-15 (×7): 650 mg via ORAL
  Filled 2015-07-11 (×8): qty 2

## 2015-07-11 NOTE — ED Provider Notes (Signed)
CSN: [id number]     Arrival date & time 07/11/15  1910 History  This chart was scribed for Bethann Berkshire, MD by Phillis Haggis, ED Scribe. This patient was seen in room APA11/APA11 and patient care was started at 8:01 PM.    Chief Complaint  Patient presents with  . Generalized Body Aches  . Chills   Patient is a 39 y.o. male presenting with fever. The history is provided by the patient (pt complains of fever and vomiting). No language interpreter was used.  Fever Temp source:  Subjective Severity:  Moderate Onset quality:  Sudden Timing:  Constant Progression:  Unable to specify Chronicity:  Recurrent Associated symptoms: chest pain, chills, cough, myalgias and vomiting   Associated symptoms: no congestion, no diarrhea, no headaches and no rash   HPI Comments: [name] is a 39 y.o. Male with hx of MRSA infection who presents to the Emergency Department complaining of generalized body aches and chills onset two days ago. Per triage note, the pt has also complained of emesis onset 3 hours ago, and chest pain, cough, and sneezing onset two days ago. He states that he was supposed to be taking anti-biotics for his MRSA infection and iliopsoas abscess but stopped taking them 3 weeks ago. Reports that his pain is spreading across the gluteal region. Denies following up with anyone because "my wife kicked me out and I had nowhere to go."  Past Medical History  Diagnosis Date  . H/O back injury   . MRSA (methicillin resistant Staphylococcus aureus)   . 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 MRSA bacteremia 04/2015   Past Surgical History  Procedure Laterality Date  . Skin biopsy    . Abdmoninal drain insertion    . Tee without cardioversion N/A 06/13/2015    Procedure: TRANSESOPHAGEAL ECHOCARDIOGRAM (TEE);  Surgeon: Wendall Stade, MD;  Location: [facility];  Service: Cardiovascular;  Laterality: N/A;   Family History  Problem Relation Age of Onset  . Cancer Father    Social History   Substance Use Topics  . Smoking status: Current Every Day Smoker -- 0.50 packs/day for 20 years    Types: Cigarettes  . Smokeless tobacco: Never Used  . Alcohol Use: No    Review of Systems  Constitutional: Positive for fever and chills. Negative for appetite change and fatigue.  HENT: Positive for sneezing. Negative for congestion, ear discharge and sinus pressure.   Eyes: Negative for discharge.  Respiratory: Positive for cough.   Cardiovascular: Positive for chest pain.  Gastrointestinal: Positive for vomiting. Negative for abdominal pain and diarrhea.  Genitourinary: Negative for frequency and hematuria.  Musculoskeletal: Positive for myalgias. Negative for back pain.  Skin: Negative for rash.  Neurological: Negative for seizures and headaches.  Psychiatric/Behavioral: Negative for hallucinations.   Allergies  Vicodin and Adhesive  Home Medications   Prior to Admission medications   Medication Sig Start Date End Date Taking? Authorizing Provider  anidulafungin 100 mg in sodium chloride 0.9 % 100 mL Inject 100 mg into the vein daily. 06/27/15   Houston Siren, MD  DAPTOmycin 500 mg in sodium chloride 0.9 % 100 mL Inject 500 mg into the vein daily. 06/27/15   Houston Siren, MD  gabapentin (NEURONTIN) 300 MG capsule Take 1 capsule (300 mg total) by mouth 3 (three) times daily. 06/19/15   Gwenyth Bender, NP  nicotine (NICODERM CQ - DOSED IN MG/24 HOURS) 21 mg/24hr patch Place 1 patch (21 mg total) onto the skin  daily. Patient not taking: Reported on 06/25/2015 06/19/15   Gwenyth Bender, NP  oxyCODONE 10 MG TABS Take 1 tablet (10 mg total) by mouth every 4 (four) hours as needed for severe pain. 06/27/15   Houston Siren, MD  polyethylene glycol [facility] / Ethelene Hal) packet Take 17 g by mouth 2 (two) times daily. May change to as needed once with regular bowel movements. Patient not taking: Reported on 05/28/2015 05/23/15   Osvaldo Shipper, MD   BP 109/70 mmHg  Pulse 152  Temp(Src) 98.3 F (36.8 C) (Oral)   Resp 18  Ht 6' (1.829 m)  Wt 180 lb (81.647 kg)  BMI 24.41 kg/m2  SpO2 100%  Physical Exam  Constitutional: He is oriented to person, place, and time. He appears well-developed.  HENT:  Head: Normocephalic.  Eyes: Conjunctivae and EOM are normal. No scleral icterus.  Neck: Neck supple. No thyromegaly present.  Cardiovascular: Normal rate and regular rhythm.  Exam reveals no gallop and no friction rub.   No murmur heard. Pulmonary/Chest: No stridor. He has no wheezes. He has no rales. He exhibits no tenderness.  Abdominal: He exhibits no distension. There is no tenderness. There is no rebound.  Genitourinary:  Tenderness to palpation across both buttocks  Musculoskeletal: Normal range of motion. He exhibits no edema.  Lymphadenopathy:    He has no cervical adenopathy.  Neurological: He is oriented to person, place, and time. He exhibits normal muscle tone. Coordination normal.  Skin: No rash noted. No erythema.  Psychiatric: He has a normal mood and affect. His behavior is normal.    ED Course  Procedures (including critical care time) DIAGNOSTIC STUDIES: Oxygen Saturation is 100% on RA, normal by my interpretation.    COORDINATION OF CARE: 8:07 PM-Discussed treatment plan which includes admission to the hospital with pt at bedside and pt agreed to plan.   Labs Review Labs Reviewed  CBC WITH DIFFERENTIAL/PLATELET  COMPREHENSIVE METABOLIC PANEL  TROPONIN I    Imaging Review No results found. CRITICAL CARE Performed by: Glenyce Randle L Total critical care time: 40 Critical care time was exclusive of separately billable procedures and treating other patients. Critical care was necessary to treat or prevent imminent or life-threatening deterioration. Critical care was time spent personally by me on the following activities: development of treatment plan with patient and/or surrogate as well as nursing, discussions with consultants, evaluation of patient's response to  treatment, examination of patient, obtaining history from patient or surrogate, ordering and performing treatments and interventions, ordering and review of laboratory studies, ordering and review of radiographic studies, pulse oximetry and re-evaluation of patient's condition.    EKG Interpretation None     I spoke with ID and they rec blood cultures and then starting back the daptomycin and anifungal medicine,  No need for mri or lumbar spine today MDM   Final diagnoses:  None    Admit septic probable mrsa.  Admit to stepdown and tx with dptomycin andantifungal     Bethann Berkshire, MD 07/11/15 2123

## 2015-07-11 NOTE — ED Notes (Addendum)
Patient complaining of body aches, chills, vomiting that started approximately 3 hours ago. Patient states is also having chest pain that is worse with deep breathing, tender on palpation, hurting x 2 days. Patient also reports cough and sneezing x 2 days.

## 2015-07-11 NOTE — Progress Notes (Signed)
ANTIBIOTIC CONSULT NOTE  Pharmacy Consult for Daptomycin Indication: Hx MRSA Infections / Candidemia / Iliopsoas Abscess   Allergies  Allergen Reactions  . Vicodin [Hydrocodone-Acetaminophen] Hives and Itching  . Adhesive [Tape] Rash    Patient Measurements: Height: 6' (182.9 cm) Weight: 180 lb (81.647 kg) IBW/kg (Calculated) : 77.6  Vital Signs: Temp: 98.3 F (36.8 C) (08/19 1922) Temp Source: Oral (08/19 1922) BP: 106/73 mmHg (08/19 2030) Pulse Rate: 137 (08/19 2030) Intake/Output from previous day:   Intake/Output from this shift:    Labs:  Recent Labs  07/11/15 2002  CREATININE 2.06*   Estimated Creatinine Clearance: 54.4 mL/min (by C-G formula based on Cr of 2.06). No results for input(s): VANCOTROUGH, VANCOPEAK, VANCORANDOM, GENTTROUGH, GENTPEAK, GENTRANDOM, TOBRATROUGH, TOBRAPEAK, TOBRARND, AMIKACINPEAK, AMIKACINTROU, AMIKACIN in the last 72 hours.   Microbiology: Recent Results (from the past 720 hour(s))  Culture, routine-abscess     Status: None   Collection Time: 06/12/15  4:10 PM  Result Value Ref Range Status   Specimen Description ABSCESS  Final   Special Requests PSOAS LEFT MUSCLE  Final   Gram Stain   Final    MODERATE WBC PRESENT,BOTH PMN AND MONONUCLEAR NO SQUAMOUS EPITHELIAL CELLS SEEN NO ORGANISMS SEEN Performed at [facility]    Culture   Final    NO GROWTH 3 DAYS Performed at [facility]    Report Status 06/16/2015 FINAL  Final  Culture, blood (routine x 2)     Status: None   Collection Time: 06/15/15  2:36 PM  Result Value Ref Range Status   Specimen Description BLOOD LEFT ARM  Final   Special Requests BOTTLES DRAWN AEROBIC AND ANAEROBIC 8CC EACH  Final   Culture NO GROWTH 5 DAYS  Final   Report Status 06/20/2015 FINAL  Final  Culture, blood (routine x 2)     Status: None   Collection Time: 06/15/15  2:50 PM  Result Value Ref Range Status   Specimen Description BLOOD RIGHT ARM  Final   Special Requests   Final     BOTTLES DRAWN AEROBIC AND ANAEROBIC AEB=8CC ANA=6CC   Culture NO GROWTH 5 DAYS  Final   Report Status 06/20/2015 FINAL  Final  Blood Culture (routine x 2)     Status: None   Collection Time: 06/25/15  7:10 PM  Result Value Ref Range Status   Specimen Description BLOOD LEFT HAND DRAWN BY RN  Final   Special Requests BOTTLES DRAWN AEROBIC AND ANAEROBIC 6CC  Final   Culture NO GROWTH 5 DAYS  Final   Report Status 06/30/2015 FINAL  Final  Fungus culture, blood     Status: None   Collection Time: 06/25/15  7:35 PM  Result Value Ref Range Status   Specimen Description BLOOD LEFT ARM  Final   Special Requests BOTTLES DRAWN AEROBIC AND ANAEROBIC 6CC  Final   Culture NO GROWTH 7 DAYS  Final   Report Status 07/02/2015 FINAL  Final  MRSA PCR Screening     Status: None   Collection Time: 06/26/15 10:55 AM  Result Value Ref Range Status   MRSA by PCR NEGATIVE NEGATIVE Final    Comment:        The GeneXpert MRSA Assay (FDA approved for NASAL specimens only), is one component of a comprehensive MRSA colonization surveillance program. It is not intended to diagnose MRSA infection nor to guide or monitor treatment for MRSA infections.     Anti-infectives    Start     Dose/Rate  Route Frequency Ordered Stop   07/11/15 2200  anidulafungin (ERAXIS) 100 mg in sodium chloride 0.9 % 100 mL IVPB     100 mg over 90 Minutes Intravenous Every 24 hours 07/11/15 2025     07/11/15 2100  DAPTOmycin (CUBICIN) 500 mg in sodium chloride 0.9 % IVPB     500 mg 220 mL/hr over 30 Minutes Intravenous Every 24 hours 07/11/15 2051     07/11/15 2045  vancomycin (VANCOCIN) IVPB 1000 mg/200 mL premix     1,000 mg 200 mL/hr over 60 Minutes Intravenous  Once 07/11/15 2038       Assessment: Okay for Protocol, Recent admissions for similar symptoms.  Apparent non-compliance with portions of outpatient regimens.  Goal of Therapy: Eradicate infection.   Plan:  Daptomycin 500mg  IV every 24 hours. Eraxis 100mg   IV every 24 hours per MD. Follow up culture results  Mady Gemma 07/11/2015,8:53 PM

## 2015-07-11 NOTE — H&P (Signed)
[facility]          History and Physical    PCP:   No PCP Per Patient   EDP: Milton Ferguson, M.D.  Chief Complaint:  Fever, chills, back pain  HPI: Patient is a 39 year old man well known to our hospital service with history of iliopsoas abscess with MRSA and fungal bacteremia who presents, yet again for another admission after being noncompliant with his antibiotics. He was initially discharged at the end of July with a planned course of daptomycin for 4 weeks and Eraxis for 2 weeks as guided by infectious diseases. Plan was for him to come to short stay. He came the first day and then disappeared. He was then readmitted and discharged on August 5 at which time arrangements were made for home health agency to provide antibiotics. Details of what happened are not quite clear to me, patient's excuse is that his wife kicked him out of the house and that because of that he was unable to receive antibiotics. There is a note from the home care agency dated 8/11 stating that the patient left the state with the PICC line in his arm and that his family would not disclose where he was. The home health agency is now refusing to participate in his care with good reason. He already had an incident in the emergency department today that escalated and culminated in security escorting the wife out of the hospital because she was upset at him for not listening to medical advice. He presents again today with fever and chills. Has has signs of sepsis with a systolic blood pressure in the 70s-80s, heart rate as high as 150, has been afebrile, has a leukocytosis of 26.3, lactic acid of 3.08, a creatinine of 2.06. We have been asked to admit him for further evaluation and management.  Allergies:   Allergies  Allergen Reactions  . Vicodin [Hydrocodone-Acetaminophen] Hives and Itching  . Adhesive [Tape] Rash      Past Medical History  Diagnosis Date  . H/O back injury   . MRSA  (methicillin resistant Staphylococcus aureus)   . 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 MRSA bacteremia 04/2015    Past Surgical History  Procedure Laterality Date  . Skin biopsy    . Abdmoninal drain insertion    . Tee without cardioversion N/A 06/13/2015    Procedure: TRANSESOPHAGEAL ECHOCARDIOGRAM (TEE);  Surgeon: Josue Hector, MD;  Location: [facility];  Service: Cardiovascular;  Laterality: N/A;    Prior to Admission medications   Medication Sig Start Date End Date Taking? Authorizing Provider  anidulafungin 100 mg in sodium chloride 0.9 % 100 mL Inject 100 mg into the vein daily. Patient not taking: Reported on 07/11/2015 06/27/15   Orvan Falconer, MD  DAPTOmycin 500 mg in sodium chloride 0.9 % 100 mL Inject 500 mg into the vein daily. Patient not taking: Reported on 07/11/2015 06/27/15   Orvan Falconer, MD  gabapentin (NEURONTIN) 300 MG capsule Take 1 capsule (300 mg total) by mouth 3 (three) times daily. Patient not taking: Reported on 07/11/2015 06/19/15   Radene Gunning, NP  nicotine (NICODERM CQ - DOSED IN MG/24 HOURS) 21 mg/24hr patch Place 1 patch (21 mg total) onto the skin daily. Patient not taking: Reported on 06/25/2015 06/19/15   Radene Gunning, NP  oxyCODONE 10 MG TABS Take 1 tablet (10 mg total) by mouth every 4 (four)  hours as needed for severe pain. Patient not taking: Reported on 07/11/2015 06/27/15   Orvan Falconer, MD  polyethylene glycol [facility] / Floria Raveling) packet Take 17 g by mouth 2 (two) times daily. May change to as needed once with regular bowel movements. Patient not taking: Reported on 05/28/2015 05/23/15   Bonnielee Haff, MD    Social History:  reports that he has been smoking Cigarettes.  He has a 10 pack-year smoking history. He has never used smokeless tobacco. He reports that he does not drink alcohol or use illicit drugs.  Family History  Problem Relation Age of Onset  . Cancer Father     Review of Systems:  Constitutional: Positive for  fever, chills, diaphoresis, appetite change  and fatigue.  HEENT: Denies photophobia, eye pain, redness, hearing loss, ear pain, congestion, sore throat, rhinorrhea, sneezing, mouth sores, trouble swallowing, neck pain, neck stiffness and tinnitus.   Respiratory: Denies SOB, DOE, cough, chest tightness,  and wheezing.   Cardiovascular: Denies chest pain, palpitations and leg swelling.  Gastrointestinal: Denies nausea, vomiting, abdominal pain, diarrhea, constipation, blood in stool and abdominal distention.  Genitourinary: Denies dysuria, urgency, frequency, hematuria, flank pain and difficulty urinating.  Endocrine: Denies: hot or cold intolerance, sweats, changes in hair or nails, polyuria, polydipsia. Musculoskeletal: Denies myalgias, back pain, joint swelling, arthralgias and gait problem.  Skin: Denies pallor, rash and wound.  Neurological: Denies dizziness, seizures, syncope, weakness, light-headedness, numbness and headaches.  Hematological: Denies adenopathy. Easy bruising, personal or family bleeding history  Psychiatric/Behavioral: Denies suicidal ideation, mood changes, confusion, nervousness, sleep disturbance and agitation   Physical Exam: Blood pressure 76/53, pulse 120, temperature 98.3 F (36.8 C), temperature source Oral, resp. rate 16, height 6' (1.829 m), weight 81.647 kg (180 lb), SpO2 96 %.  general: Alert, awake, oriented 3, keeps his eyes downcast HEENT: Normocephalic, atraumatic, pupils equal round and reactive, dry mucous membranes  Neck: Supple, no JVD, no lymphadenopathy, no bruits or goiter. Cardiovascular: Tachycardic, regular, I believe I can auscultate a systolic murmur, rubs or gallops. Lungs: Clear to auscultation bilaterally Abdomen: Soft, nontender, nondistended, positive bowel sounds, no masses or organomegaly noted. Extremities: No clubbing, cyanosis or edema, positive pulses. Neurologic: Grossly intact and nonfocal   Labs on Admission:  Results for orders placed or performed during the hospital  encounter of 07/11/15 (from the past 48 hour(s))  CBC with Differential     Status: Abnormal   Collection Time: 07/11/15  8:02 PM  Result Value Ref Range   WBC 26.3 (H) 4.0 - 10.5 K/uL   RBC 3.90 (L) 4.22 - 5.81 MIL/uL   Hemoglobin 10.4 (L) 13.0 - 17.0 g/dL   HCT 32.1 (L) 39.0 - 52.0 %   MCV 82.3 78.0 - 100.0 fL   MCH 26.7 26.0 - 34.0 pg   MCHC 32.4 30.0 - 36.0 g/dL   RDW 15.1 11.5 - 15.5 %   Platelets 213 150 - 400 K/uL   Neutrophils Relative % 96 (H) 43 - 77 %   Neutro Abs 25.3 (H) 1.7 - 7.7 K/uL   Lymphocytes Relative 2 (L) 12 - 46 %   Lymphs Abs 0.6 (L) 0.7 - 4.0 K/uL   Monocytes Relative 2 (L) 3 - 12 %   Monocytes Absolute 0.4 0.1 - 1.0 K/uL   Eosinophils Relative 0 0 - 5 %   Eosinophils Absolute 0.0 0.0 - 0.7 K/uL   Basophils Relative 0 0 - 1 %   Basophils Absolute 0.0 0.0 - 0.1 K/uL  Comprehensive  metabolic panel     Status: Abnormal   Collection Time: 07/11/15  8:02 PM  Result Value Ref Range   Sodium 135 135 - 145 mmol/L   Potassium 4.4 3.5 - 5.1 mmol/L   Chloride 100 (L) 101 - 111 mmol/L   CO2 24 22 - 32 mmol/L   Glucose, Bld 106 (H) 65 - 99 mg/dL   BUN 32 (H) 6 - 20 mg/dL   Creatinine, Ser 2.06 (H) 0.61 - 1.24 mg/dL   Calcium 9.3 8.9 - 10.3 mg/dL   Total Protein 9.4 (H) 6.5 - 8.1 g/dL   Albumin 4.4 3.5 - 5.0 g/dL   AST 165 (H) 15 - 41 U/L   ALT 80 (H) 17 - 63 U/L   Alkaline Phosphatase 241 (H) 38 - 126 U/L   Total Bilirubin 1.3 (H) 0.3 - 1.2 mg/dL   GFR calc non Af Amer 40 (L) >60 mL/min   GFR calc Af Amer 46 (L) >60 mL/min    Comment: (NOTE) The eGFR has been calculated using the CKD EPI equation. This calculation has not been validated in all clinical situations. eGFR's persistently <60 mL/min signify possible Chronic Kidney Disease.    Anion gap 11 5 - 15  Troponin I     Status: None   Collection Time: 07/11/15  8:02 PM  Result Value Ref Range   Troponin I <0.03 <0.031 ng/mL    Comment:        NO INDICATION OF MYOCARDIAL INJURY.   I-Stat CG4  Lactic Acid, ED     Status: Abnormal   Collection Time: 07/11/15  8:19 PM  Result Value Ref Range   Lactic Acid, Venous 3.08 (HH) 0.5 - 2.0 mmol/L   Comment NOTIFIED PHYSICIAN     Radiological Exams on Admission: Dg Chest Portable 1 View  07/11/2015   CLINICAL DATA:  Absence on the back. Fever, chills and body aches. Chest pain for 2 days.  EXAM: PORTABLE CHEST - 1 VIEW  COMPARISON:  June 25, 2015  FINDINGS: The heart size and mediastinal contours are within normal limits. Right central venous line is unchanged. There is no focal infiltrate, pulmonary edema, or pleural effusion. The visualized skeletal structures are unremarkable.  IMPRESSION: No active cardiopulmonary disease.   Electronically Signed   By: Abelardo Diesel M.D.   On: 07/11/2015 21:40    Assessment/Plan Principal Problem:   Sepsis Active Problems:   Iliopsoas abscess   MRSA bacteremia   Acute renal failure   Intractable pain   Candidemia    Severe sepsis with early septic shock -Due to known MRSA bacteremia and fungemia with noncompliance with antibiotic therapy. -Concerning today is a murmur which I do not believe I had auscultated on prior admission. -We will order 2-D echo to further evaluate murmur, please note that patient will probably need repeat TEE if TTE unrevealing. -Admit to intensive care unit, aggressive fluid resuscitation. Has received 2 units of IV fluids in the emergency department, will give another liter of saline followed by maintenance fluids at 150 mL an hour. -We'll repeat blood cultures, continue planned daptomycin and Eraxis. -We'll need to involve case management to see what arrangements can be made now for antibiotics. At this point I wonder if the best option would be admission to ALF/SNF until completion of antibiotics therapy.  Acute renal failure -Due to septic phenomena and probably some acute tubular necrosis due to hypotension, also previously thought to have a component of vancomycin  toxicity which is the  reason why he was switched to daptomycin. -Monitor with IV fluids.  Intractable pain -I believe he has secondary gain and exhibits drug seeking behavior. I think it is altogether a possibility that he refuses to take his antibiotics in order to buy him admission to the hospital. -I have explained to him in no uncertain terms, with Kathrynn Speed as a witness, that absolutely no narcotic pain medications will be given in the hospital and none will be prescribed upon discharge. -We will treat his pain in the hospital with Neurontin, Tylenol and as needed Toradol being mindful of dosing with his acute renal failure.  DVT prophylaxis -Subcutaneous heparin  CODE STATUS -Full code   Time Spent on Admission: 110 minutes  [facility] Pager: [phone] 07/11/2015, 10:22 PM

## 2015-07-11 NOTE — ED Notes (Signed)
Pt screaming out in wanting pain meds. Wife upset because pt has been admitted 4 times and is not listening to the doctors. Pt asked wife to leave. Wife will not be allowed back.

## 2015-07-12 ENCOUNTER — Inpatient Hospital Stay (HOSPITAL_COMMUNITY): Payer: Medicaid Other

## 2015-07-12 LAB — CBC WITH DIFFERENTIAL/PLATELET
Basophils Absolute: 0 10*3/uL (ref 0.0–0.1)
Basophils Relative: 0 % (ref 0–1)
EOS PCT: 0 % (ref 0–5)
Eosinophils Absolute: 0 10*3/uL (ref 0.0–0.7)
HEMATOCRIT: 25.7 % — AB (ref 39.0–52.0)
Hemoglobin: 8.2 g/dL — ABNORMAL LOW (ref 13.0–17.0)
LYMPHS ABS: 1.9 10*3/uL (ref 0.7–4.0)
LYMPHS PCT: 7 % — AB (ref 12–46)
MCH: 26.7 pg (ref 26.0–34.0)
MCHC: 31.9 g/dL (ref 30.0–36.0)
MCV: 83.7 fL (ref 78.0–100.0)
MONO ABS: 2.2 10*3/uL — AB (ref 0.1–1.0)
MONOS PCT: 8 % (ref 3–12)
Neutro Abs: 24.6 10*3/uL — ABNORMAL HIGH (ref 1.7–7.7)
Neutrophils Relative %: 86 % — ABNORMAL HIGH (ref 43–77)
Platelets: 190 10*3/uL (ref 150–400)
RBC: 3.07 MIL/uL — ABNORMAL LOW (ref 4.22–5.81)
RDW: 15.1 % (ref 11.5–15.5)
WBC Morphology: INCREASED
WBC: 28.8 10*3/uL — ABNORMAL HIGH (ref 4.0–10.5)

## 2015-07-12 LAB — URINALYSIS, ROUTINE W REFLEX MICROSCOPIC
Bilirubin Urine: NEGATIVE
GLUCOSE, UA: NEGATIVE mg/dL
KETONES UR: NEGATIVE mg/dL
Nitrite: NEGATIVE
PH: 6 (ref 5.0–8.0)
Protein, ur: 100 mg/dL — AB
Specific Gravity, Urine: 1.02 (ref 1.005–1.030)
Urobilinogen, UA: 4 mg/dL — ABNORMAL HIGH (ref 0.0–1.0)

## 2015-07-12 LAB — COMPREHENSIVE METABOLIC PANEL
ALT: 69 U/L — ABNORMAL HIGH (ref 17–63)
ANION GAP: 5 (ref 5–15)
AST: 79 U/L — AB (ref 15–41)
Albumin: 3.1 g/dL — ABNORMAL LOW (ref 3.5–5.0)
Alkaline Phosphatase: 164 U/L — ABNORMAL HIGH (ref 38–126)
BILIRUBIN TOTAL: 0.8 mg/dL (ref 0.3–1.2)
BUN: 34 mg/dL — AB (ref 6–20)
CO2: 23 mmol/L (ref 22–32)
Calcium: 8.1 mg/dL — ABNORMAL LOW (ref 8.9–10.3)
Chloride: 107 mmol/L (ref 101–111)
Creatinine, Ser: 2.11 mg/dL — ABNORMAL HIGH (ref 0.61–1.24)
GFR, EST AFRICAN AMERICAN: 45 mL/min — AB (ref >60)
GFR, EST NON AFRICAN AMERICAN: 39 mL/min — AB (ref >60)
Glucose, Bld: 119 mg/dL — ABNORMAL HIGH (ref 65–99)
POTASSIUM: 6.6 mmol/L — AB (ref 3.5–5.1)
Sodium: 135 mmol/L (ref 135–145)
TOTAL PROTEIN: 6.8 g/dL (ref 6.5–8.1)

## 2015-07-12 LAB — LACTIC ACID, PLASMA
LACTIC ACID, VENOUS: 2.6 mmol/L — AB (ref 0.5–2.0)
Lactic Acid, Venous: 1.5 mmol/L (ref 0.5–2.0)

## 2015-07-12 LAB — BASIC METABOLIC PANEL
ANION GAP: 6 (ref 5–15)
BUN: 34 mg/dL — ABNORMAL HIGH (ref 6–20)
CHLORIDE: 108 mmol/L (ref 101–111)
CO2: 25 mmol/L (ref 22–32)
Calcium: 8.5 mg/dL — ABNORMAL LOW (ref 8.9–10.3)
Creatinine, Ser: 2 mg/dL — ABNORMAL HIGH (ref 0.61–1.24)
GFR, EST AFRICAN AMERICAN: 48 mL/min — AB (ref >60)
GFR, EST NON AFRICAN AMERICAN: 41 mL/min — AB (ref >60)
Glucose, Bld: 116 mg/dL — ABNORMAL HIGH (ref 65–99)
POTASSIUM: 4.4 mmol/L (ref 3.5–5.1)
SODIUM: 139 mmol/L (ref 135–145)

## 2015-07-12 LAB — PROTIME-INR
INR: 1.27 (ref 0.00–1.49)
PROTHROMBIN TIME: 16.1 s — AB (ref 11.6–15.2)

## 2015-07-12 LAB — URINE MICROSCOPIC-ADD ON

## 2015-07-12 LAB — APTT: aPTT: 34 seconds (ref 24–37)

## 2015-07-12 LAB — PROCALCITONIN: Procalcitonin: 92.06 ng/mL

## 2015-07-12 MED ORDER — ENSURE ENLIVE PO LIQD
237.0000 mL | Freq: Two times a day (BID) | ORAL | Status: DC
  Administered 2015-07-13: 237 mL via ORAL

## 2015-07-12 MED ORDER — SODIUM CHLORIDE 0.9 % IV SOLN
1.0000 g | Freq: Once | INTRAVENOUS | Status: AC
  Administered 2015-07-12: 1 g via INTRAVENOUS
  Filled 2015-07-12: qty 10

## 2015-07-12 MED ORDER — OXYCODONE HCL 5 MG PO TABS
15.0000 mg | ORAL_TABLET | ORAL | Status: DC | PRN
  Administered 2015-07-12 – 2015-07-15 (×17): 15 mg via ORAL
  Filled 2015-07-12 (×18): qty 3

## 2015-07-12 MED ORDER — LORAZEPAM 0.5 MG PO TABS
0.5000 mg | ORAL_TABLET | Freq: Four times a day (QID) | ORAL | Status: DC | PRN
  Filled 2015-07-12 (×3): qty 1

## 2015-07-12 MED ORDER — DEXTROSE 50 % IV SOLN
1.0000 | Freq: Once | INTRAVENOUS | Status: AC
  Administered 2015-07-12: 50 mL via INTRAVENOUS

## 2015-07-12 MED ORDER — OXYCODONE HCL 5 MG PO TABS
10.0000 mg | ORAL_TABLET | ORAL | Status: DC | PRN
  Administered 2015-07-12: 10 mg via ORAL
  Filled 2015-07-12: qty 2

## 2015-07-12 MED ORDER — DEXTROSE 50 % IV SOLN
INTRAVENOUS | Status: AC
  Filled 2015-07-12: qty 50

## 2015-07-12 MED ORDER — INSULIN ASPART 100 UNIT/ML ~~LOC~~ SOLN
6.0000 [IU] | Freq: Once | SUBCUTANEOUS | Status: AC
  Administered 2015-07-12: 6 [IU] via INTRAVENOUS

## 2015-07-12 MED ORDER — SODIUM POLYSTYRENE SULFONATE 15 GM/60ML PO SUSP
15.0000 g | Freq: Once | ORAL | Status: AC
  Administered 2015-07-12: 15 g via ORAL
  Filled 2015-07-12: qty 60

## 2015-07-12 MED ORDER — TRAMADOL HCL 50 MG PO TABS
50.0000 mg | ORAL_TABLET | Freq: Four times a day (QID) | ORAL | Status: DC | PRN
  Filled 2015-07-12: qty 1

## 2015-07-12 MED ORDER — SODIUM POLYSTYRENE SULFONATE 15 GM/60ML PO SUSP
30.0000 g | ORAL | Status: DC
  Administered 2015-07-12: 30 g via ORAL
  Filled 2015-07-12 (×2): qty 120

## 2015-07-12 MED ORDER — SODIUM CHLORIDE 0.9 % IV BOLUS (SEPSIS)
1000.0000 mL | Freq: Once | INTRAVENOUS | Status: AC
  Administered 2015-07-12: 1000 mL via INTRAVENOUS

## 2015-07-12 NOTE — Progress Notes (Signed)
Initial Nutrition Assessment  DOCUMENTATION CODES:  Not applicable  INTERVENTION:  Ensure Enlive po BID, each supplement provides 350 kcal and 20 grams of protein  NUTRITION DIAGNOSIS:  Increased nutrient needs related to acute illness as evidenced by loss of 10% bw in the past 3 months  GOAL:  Patient will meet greater than or equal to 90% of their needs  MONITOR:  PO intake, Supplement acceptance, Labs, I & O's  REASON FOR ASSESSMENT:  Malnutrition Screening Tool    ASSESSMENT:  39 year old male PMHx MRSA, Iliopsoas abscess (04/2015); and MRSA bacteremia (04/2015). He has had multiple admissions for the abscess r/t medical noncompliance. Patient was  discharged from the hospital on 06/19/15 and on 06/27/15. Presents with fever, chills. Treated for sepsis from known MRSA bacteremia  RD operating remotely.  Patient did not report of any trouble with oral intake though he did report n/v and pain in leg  Per [facility] report, pt has lost weight in the last 3 months. Mid June he was weighed in ED as 200 lbs. This would indicate a 10% loss in weight since that time. However, he has gained weight, about 13 lbs since his last admission.   Diet Order:  Diet regular Room service appropriate?: Yes; Fluid consistency:: Thin  Skin:  Reviewed, no issues  Last BM:  8/19  Height:  Ht Readings from Last 1 Encounters:  07/11/15 6' (1.829 m)   Weight:  Wt Readings from Last 1 Encounters:  07/12/15 207 lb 7.3 oz (94.1 kg)   Wt Readings from Last 10 Encounters:  07/12/15 207 lb 7.3 oz (94.1 kg)  06/27/15 179 lb 9.6 oz (81.466 kg)  06/19/15 188 lb 6.4 oz (85.458 kg)  06/02/15 174 lb (78.926 kg)  06/02/15 174 lb (78.926 kg)  05/28/15 173 lb 12.8 oz (78.835 kg)  05/15/15 200 lb (90.719 kg)  05/15/15 200 lb (90.719 kg)  05/05/15 200 lb (90.719 kg)  05/04/15 200 lb (90.719 kg)  Admit weight: 180 lbs (81.81kg)  Ideal Body Weight:  80.9 kg  BMI:  Body mass index is 28.13 kg/(m^2).  Estimated  Nutritional Needs:  Kcal:  1900-2050 (23-25 kcal/kg) Protein:  81-97 g Pro (1-1.2 g/kg IBW) Fluid:  > 2 liters  EDUCATION NEEDS:  No education needs identified at this time  Christophe Louis RD, LDN Nutrition Pager: [phone] 07/12/2015 12:46 PM

## 2015-07-12 NOTE — Progress Notes (Signed)
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 He was initially discharged at the end of July with a planned course of daptomycin for 4 weeks and Eraxis for 2 weeks as guided by ID. Noncompliant with f/u at short stay for abx. Readmitted and subsequently discharged 8/5 with plan for [facility]. According to chart, pt left state with PICC line and HH refused further care. Noncompliant with antibiotics x3 weeks. In ED there was a situation wheren his wife was escorted out of the hospital. He presented with signs of sepsis with a systolic blood pressure in the 70s-80s, heart rate as high as 150, leukocytosis of 26.3, lactic acid of 3.08, a creatinine of 2.06. Admitted for further evaluation and management.  Assessment/Plan: 1. Severe sepsis with AKI, hypotension. 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 BP improved, clinically improving.  2. Iliopsoas abscess/MRSA. Continue daptomycin (previous vanc toxicity 05/2015) 3. 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 Continue anidulafungin.  4. AKI secondary to sepsis, suspect ATN. No change overnight.  5. Hypotension, improving with IVF.  6. Hyperkalemia secondary to AKI. Treated with Kayexylate, D50 and insulin 7. Anemia of acute illness, superimposed on anemia of chronic disease secondary to longstanding infection.  8. Noncompliance with medical care and medications for unclear reasons 9. Social issues. 10. Tobacco use disorder   Overall improved with increased BP and normalization of lactic acid. Sepsis clinically resolving.   Discussed with patient the importance of compliance with medical care. He understands the severity of his illness and consequences of noncompliance with care. Discussed  with RN Alcario Drought at bedside.   Will follow blood cultures and possibly remove PICC line tomorrow.   Continue IVF, antifungal and antibiotic. No neuro deficits, will monitor clinically.  Consult nephrology, strict I/O, aggressive IVF, check renal ultrasound, Kayexylate   Code Status: Full DVT prophylaxis: SCDs Family Communication:  Discussed with patient the importance of compliance with medical care. He understands the severity of his illness and consequences of noncompliance with care.   Disposition Plan: Home pending improvement.   Brendia Sacks, MD  [facility]  Pager [phone] If 7PM-7AM, please contact night-coverage at [email or web address], password [email or web address] 07/12/2015, 6:20 AM  LOS: 1 day   Consultants:  Nephrology   Procedures:    Antibiotics:  Eraxis 8/19>>  Daptomycin 8/19>>  HPI/Subjective: Not feeling well. Nausea, vomiting. Pain in back radiating buttocks and to left leg which is chronic but is now also in his right leg. Reports that the pain feels like he was kicked in the butt.  It has been a few days since his PICC line has been flushed.   Reports he left home due to argument with wife, which in turned caused him to miss [facility] appointment for abx.   Objective: Filed Vitals:   07/12/15 0300 07/12/15 0400 07/12/15 0405 07/12/15 0406  BP:   Pulse: 67 75 76   Temp:    97.6 F (36.4 C)  TempSrc:    Oral  Resp: 19  18   Height:      Weight:    94.1 kg (207 lb 7.3 oz)  SpO2: 100% 99% 100%     Intake/Output Summary (Last 24 hours) at 07/12/15 8295 Last data filed at 07/11/15 2210  Gross per 24 hour  Intake  2500 ml  Output      0 ml  Net   2500 ml     Filed Weights   07/11/15 1922 07/11/15 2200 07/12/15 0406  Weight: 81.647 kg (180 lb) 82.7 kg (182 lb 5.1 oz) 94.1 kg (207 lb 7.3 oz)    Exam: Afebrile, hypotensive, no hypoxia General:  Appears comfortable, calm. Lying in bed.  Eyes: PERRL, normal lids, irises ENT: grossly normal  hearing, lips, tongue Neck: no LAD, masses, thyromegaly Cardiovascular: Regular rate and rhythm, no murmur, rub or gallop. DP pulses 2+ bilateral. Telemetry: Sinus rhythm  Respiratory: Clear to auscultation bilaterally, no wheezes, rales or rhonchi. Normal respiratory effort. Skin: no rash or induration noted  Musculoskeletal: grossly normal tone bilateral upper and lower extremities, normal strength BLE, sensation grossly intact Psychiatric: grossly normal mood and affect, speech fluent and appropriate Neurologic: grossly non-focal..  New data reviewed:  Lactic acid 3.08 >> 2.6 >> 1.5  WBC 26.3 >>  K+ 6.6   Creatinine 2.06 >> 2.11  LFTs better, t bili now normal, AST down to 69 and AST down to 79  EKG SR  Pertinent data since admission:    Pending data:  BC  Scheduled Meds: . anidulafungin  100 mg Intravenous Q24H  . DAPTOmycin (CUBICIN)  IV  500 mg Intravenous Q24H  . gabapentin  300 mg Oral TID  . heparin  5,000 Units Subcutaneous 3 times per day   Continuous Infusions: . sodium chloride 150 mL/hr at 07/12/15 0116    Principal Problem:   Sepsis Active Problems:   Iliopsoas abscess   MRSA bacteremia   Acute renal failure   Intractable pain   Candidemia   Time spent 30 minutes  I, Jessica D. Jari Pigg, acting as scribe, recorded this note contemporaneously in the presence of Dr. Melton Alar. Irene Limbo, M.D. on 07/12/2015 .   I have reviewed the above documentation for accuracy and completeness, and I agree with the above. Brendia Sacks, MD

## 2015-07-12 NOTE — Progress Notes (Signed)
CRITICAL VALUE ALERT  Critical value received:  K 6.6  Date of notification:  07/12/2015  Time of notification:  0621  Critical value read back:Yes.    Nurse who received alert:  Loney Laurence  MD notified (1st page):  Dr. Dayna Ramus  Time of first page:  0621  MD notified (2nd page):  Time of second page:  Responding MD:   Time MD responded:

## 2015-07-12 NOTE — Consult Note (Signed)
Reason for Consult: Acute kidney injury Referring Physician: Dr. Harmon Pier is an 39 y.o. male.  HPI: He is a patient well has been here recently with history of MRSA infection and iliopsoas abscess presently came with complaints of fever, chills. When he was evaluated patient was found to have hypotension and worsening of his renal failure hence admitted to the hospital. During his last discharge patient was supposed to get daptomycin IV as an outpatient however patient missed his treatment for about 7 days. Presently he complains of back pain, nausea, fever, chills and sweating. Patient does not have any previous history of renal failure. However during his last admission patient had acute kidney injury which was improving before his discharge.  Past Medical History  Diagnosis Date  . H/O back injury   . MRSA (methicillin resistant Staphylococcus aureus)   . 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 MRSA bacteremia 04/2015    Past Surgical History  Procedure Laterality Date  . Skin biopsy    . Abdmoninal drain insertion    . Tee without cardioversion N/A 06/13/2015    Procedure: TRANSESOPHAGEAL ECHOCARDIOGRAM (TEE);  Surgeon: Wendall Stade, MD;  Location: [facility];  Service: Cardiovascular;  Laterality: N/A;    Family History  Problem Relation Age of Onset  . Cancer Father     Social History:  reports that he has been smoking Cigarettes.  He has a 10 pack-year smoking history. He has never used smokeless tobacco. He reports that he does not drink alcohol or use illicit drugs.  Allergies:  Allergies  Allergen Reactions  . Vicodin [Hydrocodone-Acetaminophen] Hives and Itching  . Adhesive [Tape] Rash    Medications: I have reviewed the patient's current medications.  Results for orders placed or performed during the hospital encounter of 07/11/15 (from the past 48 hour(s))  CBC with Differential     Status: Abnormal   Collection Time: 07/11/15  8:02 PM  Result Value Ref  Range   WBC 26.3 (H) 4.0 - 10.5 K/uL   RBC 3.90 (L) 4.22 - 5.81 MIL/uL   Hemoglobin 10.4 (L) 13.0 - 17.0 g/dL   HCT 86.3 (L) 39.5 - 12.0 %   MCV 82.3 78.0 - 100.0 fL   MCH 26.7 26.0 - 34.0 pg   MCHC 32.4 30.0 - 36.0 g/dL   RDW 97.1 17.3 - 60.2 %   Platelets 213 150 - 400 K/uL   Neutrophils Relative % 96 (H) 43 - 77 %   Neutro Abs 25.3 (H) 1.7 - 7.7 K/uL   Lymphocytes Relative 2 (L) 12 - 46 %   Lymphs Abs 0.6 (L) 0.7 - 4.0 K/uL   Monocytes Relative 2 (L) 3 - 12 %   Monocytes Absolute 0.4 0.1 - 1.0 K/uL   Eosinophils Relative 0 0 - 5 %   Eosinophils Absolute 0.0 0.0 - 0.7 K/uL   Basophils Relative 0 0 - 1 %   Basophils Absolute 0.0 0.0 - 0.1 K/uL  Comprehensive metabolic panel     Status: Abnormal   Collection Time: 07/11/15  8:02 PM  Result Value Ref Range   Sodium 135 135 - 145 mmol/L   Potassium 4.4 3.5 - 5.1 mmol/L   Chloride 100 (L) 101 - 111 mmol/L   CO2 24 22 - 32 mmol/L   Glucose, Bld 106 (H) 65 - 99 mg/dL   BUN 32 (H) 6 - 20 mg/dL   Creatinine, Ser 4.64 (H) 0.61 - 1.24 mg/dL  Calcium 9.3 8.9 - 10.3 mg/dL   Total Protein 9.4 (H) 6.5 - 8.1 g/dL   Albumin 4.4 3.5 - 5.0 g/dL   AST 165 (H) 15 - 41 U/L   ALT 80 (H) 17 - 63 U/L   Alkaline Phosphatase 241 (H) 38 - 126 U/L   Total Bilirubin 1.3 (H) 0.3 - 1.2 mg/dL   GFR calc non Af Amer 40 (L) >60 mL/min   GFR calc Af Amer 46 (L) >60 mL/min    Comment: (NOTE) The eGFR has been calculated using the CKD EPI equation. This calculation has not been validated in all clinical situations. eGFR's persistently <60 mL/min signify possible Chronic Kidney Disease.    Anion gap 11 5 - 15  Troponin I     Status: None   Collection Time: 07/11/15  8:02 PM  Result Value Ref Range   Troponin I <0.03 <0.031 ng/mL    Comment:        NO INDICATION OF MYOCARDIAL INJURY.   I-Stat CG4 Lactic Acid, ED     Status: Abnormal   Collection Time: 07/11/15  8:19 PM  Result Value Ref Range   Lactic Acid, Venous 3.08 (HH) 0.5 - 2.0 mmol/L    Comment NOTIFIED PHYSICIAN   Blood culture (routine x 2)     Status: None (Preliminary result)   Collection Time: 07/11/15  8:21 PM  Result Value Ref Range   Specimen Description BLOOD LEFT HAND    Special Requests BOTTLES DRAWN AEROBIC AND ANAEROBIC 6CC    Culture NO GROWTH < 12 HOURS    Report Status PENDING   Blood culture (routine x 2)     Status: None (Preliminary result)   Collection Time: 07/11/15  8:28 PM  Result Value Ref Range   Specimen Description BLOOD LEFT ARM    Special Requests BOTTLES DRAWN AEROBIC AND ANAEROBIC 6CC    Culture NO GROWTH < 12 HOURS    Report Status PENDING   Lactic acid, plasma     Status: Abnormal   Collection Time: 07/12/15 12:46 AM  Result Value Ref Range   Lactic Acid, Venous 2.6 (HH) 0.5 - 2.0 mmol/L    Comment: CRITICAL RESULT CALLED TO, READ BACK BY AND VERIFIED WITH:  HEARN,J @ 0130 ON 05/12/15 BY WOODIE,J   Procalcitonin     Status: None   Collection Time: 07/12/15 12:46 AM  Result Value Ref Range   Procalcitonin 92.06 ng/mL    Comment:        Interpretation: PCT >= 10 ng/mL: Important systemic inflammatory response, almost exclusively due to severe bacterial sepsis or septic shock. (NOTE)         ICU PCT Algorithm               Non ICU PCT Algorithm    ----------------------------     ------------------------------         PCT < 0.25 ng/mL                 PCT < 0.1 ng/mL     Stopping of antibiotics            Stopping of antibiotics       strongly encouraged.               strongly encouraged.    ----------------------------     ------------------------------       PCT level decrease by               PCT < 0.25  ng/mL       >= 80% from peak PCT       OR PCT 0.25 - 0.5 ng/mL          Stopping of antibiotics                                             encouraged.     Stopping of antibiotics           encouraged.    ----------------------------     ------------------------------       PCT level decrease by              PCT >= 0.25  ng/mL       < 80% from peak PCT        AND PCT >= 0.5 ng/mL             Continuing antibiotics                                              encouraged.       Continuing antibiotics            encouraged.    ----------------------------     ------------------------------     PCT level increase compared          PCT > 0.5 ng/mL         with peak PCT AND          PCT >= 0.5 ng/mL             Escalation of antibiotics                                          strongly encouraged.      Escalation of antibiotics        strongly encouraged.   Protime-INR     Status: Abnormal   Collection Time: 07/12/15 12:46 AM  Result Value Ref Range   Prothrombin Time 16.1 (H) 11.6 - 15.2 seconds   INR 1.27 0.00 - 1.49  APTT     Status: None   Collection Time: 07/12/15 12:46 AM  Result Value Ref Range   aPTT 34 24 - 37 seconds  CBC with Differential     Status: Abnormal   Collection Time: 07/12/15  4:26 AM  Result Value Ref Range   WBC 28.8 (H) 4.0 - 10.5 K/uL   RBC 3.07 (L) 4.22 - 5.81 MIL/uL   Hemoglobin 8.2 (L) 13.0 - 17.0 g/dL    Comment: DELTA CHECK NOTED RESULT REPEATED AND VERIFIED    HCT 25.7 (L) 39.0 - 52.0 %   MCV 83.7 78.0 - 100.0 fL   MCH 26.7 26.0 - 34.0 pg   MCHC 31.9 30.0 - 36.0 g/dL   RDW 15.1 11.5 - 15.5 %   Platelets 190 150 - 400 K/uL   Neutrophils Relative % 86 (H) 43 - 77 %   Neutro Abs 24.6 (H) 1.7 - 7.7 K/uL   Lymphocytes Relative 7 (L) 12 - 46 %   Lymphs Abs 1.9 0.7 - 4.0 K/uL   Monocytes Relative 8 3 - 12 %   Monocytes Absolute 2.2 (  H) 0.1 - 1.0 K/uL   Eosinophils Relative 0 0 - 5 %   Eosinophils Absolute 0.0 0.0 - 0.7 K/uL   Basophils Relative 0 0 - 1 %   Basophils Absolute 0.0 0.0 - 0.1 K/uL   WBC Morphology INCREASED BANDS (>20% BANDS)   Comprehensive metabolic panel     Status: Abnormal   Collection Time: 07/12/15  4:26 AM  Result Value Ref Range   Sodium 135 135 - 145 mmol/L   Potassium 6.6 (HH) 3.5 - 5.1 mmol/L    Comment: DELTA CHECK NOTED CRITICAL  RESULT CALLED TO, READ BACK BY AND VERIFIED WITH: HEARN, J AT 0620 ON 07/12/2015 BY WOODS,M    Chloride 107 101 - 111 mmol/L   CO2 23 22 - 32 mmol/L   Glucose, Bld 119 (H) 65 - 99 mg/dL   BUN 34 (H) 6 - 20 mg/dL   Creatinine, Ser 2.11 (H) 0.61 - 1.24 mg/dL   Calcium 8.1 (L) 8.9 - 10.3 mg/dL   Total Protein 6.8 6.5 - 8.1 g/dL   Albumin 3.1 (L) 3.5 - 5.0 g/dL   AST 79 (H) 15 - 41 U/L   ALT 69 (H) 17 - 63 U/L   Alkaline Phosphatase 164 (H) 38 - 126 U/L   Total Bilirubin 0.8 0.3 - 1.2 mg/dL   GFR calc non Af Amer 39 (L) >60 mL/min   GFR calc Af Amer 45 (L) >60 mL/min    Comment: (NOTE) The eGFR has been calculated using the CKD EPI equation. This calculation has not been validated in all clinical situations. eGFR's persistently <60 mL/min signify possible Chronic Kidney Disease.    Anion gap 5 5 - 15  Lactic acid, plasma     Status: None   Collection Time: 07/12/15  4:26 AM  Result Value Ref Range   Lactic Acid, Venous 1.5 0.5 - 2.0 mmol/L    Dg Chest Portable 1 View  07/11/2015   CLINICAL DATA:  Absence on the back. Fever, chills and body aches. Chest pain for 2 days.  EXAM: PORTABLE CHEST - 1 VIEW  COMPARISON:  June 25, 2015  FINDINGS: The heart size and mediastinal contours are within normal limits. Right central venous line is unchanged. There is no focal infiltrate, pulmonary edema, or pleural effusion. The visualized skeletal structures are unremarkable.  IMPRESSION: No active cardiopulmonary disease.   Electronically Signed   By: Abelardo Diesel M.D.   On: 07/11/2015 21:40    Review of Systems  Constitutional: Positive for fever and chills.  Respiratory: Negative for cough and shortness of breath.   Gastrointestinal: Positive for nausea. Negative for vomiting and abdominal pain.  Musculoskeletal: Positive for back pain and joint pain.  Neurological: Positive for weakness.   Blood pressure 121/83, pulse 68, temperature 97.9 F (36.6 C), temperature source Oral, resp. rate  15, height 6' (1.829 m), weight 207 lb 7.3 oz (94.1 kg), SpO2 100 %. Physical Exam  Constitutional: He is oriented to person, place, and time. No distress.  HENT:  Mouth/Throat: No oropharyngeal exudate.  Eyes: Left eye exhibits no discharge.  Neck: No JVD present.  Cardiovascular: Normal rate and regular rhythm.   No murmur heard. Respiratory: No respiratory distress. He has no wheezes. He has no rales.  GI: He exhibits no distension. There is no tenderness.  Musculoskeletal: He exhibits no edema.  Neurological: He is alert and oriented to person, place, and time.    Assessment/Plan: Problem #1 acute kidney injury: Most likely ATN versus  prerenal. We will during his admission a couple of weeks ago patient had acute kidney injury thought to be secondary to combination of ATN/prerenal/contrast induced acute kidney injury/vancomycin. His creatinine improved from 1.77 to 1.45 before his discharge. Vancomycin was discontinued and patient was on daptomycin. Presently his creatinine has increased. Presently patient is nonoliguric with 650 mL of urine output. Problem #2 hyperkalemia: Most likely a combination of renal failure and high potassium intake Problem #3 sepsis: Patient has missed his antibiotics. His white blood cell count is high. Problem #4 anemia Problem #5 history of candidemia Problem #6 history of chronic pain. Plan: We'll put patient on low potassium diet 2] will change kayexalate to 30 g by mouth 2 doses 3] agree with hydration 4] we'll check his basic metabolic panel in the morning.  Kinda Pottle S 07/12/2015, 9:11 AM

## 2015-07-13 DIAGNOSIS — D696 Thrombocytopenia, unspecified: Secondary | ICD-10-CM

## 2015-07-13 LAB — CBC
HCT: 26.1 % — ABNORMAL LOW (ref 39.0–52.0)
Hemoglobin: 8.3 g/dL — ABNORMAL LOW (ref 13.0–17.0)
MCH: 26.7 pg (ref 26.0–34.0)
MCHC: 31.8 g/dL (ref 30.0–36.0)
MCV: 83.9 fL (ref 78.0–100.0)
PLATELETS: 104 10*3/uL — AB (ref 150–400)
RBC: 3.11 MIL/uL — AB (ref 4.22–5.81)
RDW: 15.4 % (ref 11.5–15.5)
WBC: 10.7 10*3/uL — ABNORMAL HIGH (ref 4.0–10.5)

## 2015-07-13 LAB — COMPREHENSIVE METABOLIC PANEL
ALBUMIN: 3 g/dL — AB (ref 3.5–5.0)
ALT: 60 U/L (ref 17–63)
AST: 45 U/L — AB (ref 15–41)
Alkaline Phosphatase: 173 U/L — ABNORMAL HIGH (ref 38–126)
Anion gap: 7 (ref 5–15)
BUN: 25 mg/dL — ABNORMAL HIGH (ref 6–20)
CHLORIDE: 111 mmol/L (ref 101–111)
CO2: 22 mmol/L (ref 22–32)
CREATININE: 1.8 mg/dL — AB (ref 0.61–1.24)
Calcium: 8.5 mg/dL — ABNORMAL LOW (ref 8.9–10.3)
GFR calc non Af Amer: 47 mL/min — ABNORMAL LOW (ref >60)
GFR, EST AFRICAN AMERICAN: 54 mL/min — AB (ref >60)
GLUCOSE: 116 mg/dL — AB (ref 65–99)
Potassium: 4.5 mmol/L (ref 3.5–5.1)
SODIUM: 140 mmol/L (ref 135–145)
Total Bilirubin: 0.8 mg/dL (ref 0.3–1.2)
Total Protein: 6.6 g/dL (ref 6.5–8.1)

## 2015-07-13 MED ORDER — FUROSEMIDE 10 MG/ML IJ SOLN
40.0000 mg | Freq: Two times a day (BID) | INTRAMUSCULAR | Status: DC
  Administered 2015-07-13 (×2): 40 mg via INTRAVENOUS
  Filled 2015-07-13 (×5): qty 4

## 2015-07-13 MED ORDER — ONDANSETRON HCL 4 MG/2ML IJ SOLN
4.0000 mg | Freq: Four times a day (QID) | INTRAMUSCULAR | Status: DC | PRN
Start: 2015-07-13 — End: 2015-07-15
  Administered 2015-07-13 – 2015-07-15 (×7): 4 mg via INTRAVENOUS
  Filled 2015-07-13 (×8): qty 2

## 2015-07-13 MED ORDER — ONDANSETRON 4 MG PO TBDP
4.0000 mg | ORAL_TABLET | Freq: Four times a day (QID) | ORAL | Status: DC
  Filled 2015-07-13 (×7): qty 1

## 2015-07-13 NOTE — Progress Notes (Signed)
Subjective: Patient still complains of back pain. He complains of also some nausea and vomiting and some diarrhea. He denies any difficulty breathing. He has some chills.   Objective: Vital signs in last 24 hours: Temp:  [97.3 F (36.3 C)-103.1 F (39.5 C)] 99.6 F (37.6 C) (08/21 0800) Pulse Rate:  [68-107] 95 (08/21 0800) Resp:  [11-38] 28 (08/21 0800) BP: (100-135)/(58-88) 135/76 mmHg (08/21 0800) SpO2:  [97 %-100 %] 100 % (08/21 0800) Weight:  [194 lb 10.7 oz (88.3 kg)] 194 lb 10.7 oz (88.3 kg) (08/21 0500)  Intake/Output from previous day: 08/20 0701 - 08/21 0700 In: 3417.5 [P.O.:720; I.V.:2697.5] Out: 1000 [Urine:1000] Intake/Output this shift:     Recent Labs  07/11/15 2002 07/12/15 0426 07/13/15 0516  HGB 10.4* 8.2* 8.3*    Recent Labs  07/12/15 0426 07/13/15 0516  WBC 28.8* 10.7*  RBC 3.07* 3.11*  HCT 25.7* 26.1*  PLT 190 104*    Recent Labs  07/12/15 1200 07/13/15 0516  NA 139 140  K 4.4 4.5  CL 108 111  CO2 25 22  BUN 34* 25*  CREATININE 2.00* 1.80*  GLUCOSE 116* 116*  CALCIUM 8.5* 8.5*    Recent Labs  07/12/15 0046  INR 1.27   Generally patient is alert and in no apparent distress Chest is clear to auscultation His heart exam regular rate and rhythm no murmur no S3 Extremities no edema  Assessment/Plan: Problem #1 acute kidney injury: Possibly prerenal versus ATN. Presently is non-oliguric. His BUN and creatinine are slowly improving. Problem #2 hyperkalemia: His potassium has corrected Problem #3 sepsis: Presently patient is on anti-biotic's. He is a febrile. His white blood cell count has improved. Problem #4 anemia: Most likely iron deficiency anemia: His hemoglobin is declining Problem #5 history of iliopsoas abscess/MRSA/condyle infection. Problem #6 chronic pain Plan: We'll start patient on Lasix 40 mg IV twice a day We'll continue his hydration We'll check his basic metabolic panel in the morning.   Rainey Rodger  S 07/13/2015, 8:48 AM

## 2015-07-13 NOTE — Progress Notes (Signed)
Utilization review completed.  

## 2015-07-13 NOTE — Progress Notes (Addendum)
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 He was initially discharged at the end of July with a planned course of daptomycin for 4 weeks and Eraxis for 2 weeks as guided by ID. Noncompliant with f/u at short stay for abx. Readmitted and subsequently discharged 8/5 with plan for [facility]. According to chart, pt left state with PICC line and HH refused further care. Noncompliant with antibiotics x3 weeks. In ED there was a situation wheren his wife was escorted out of the hospital. He presented with signs of sepsis with a systolic blood pressure in the 70s-80s, heart rate as high as 150, leukocytosis of 26.3, lactic acid of 3.08, a creatinine of 2.06. Admitted for further evaluation and management.  Assessment/Plan: 1. Severe sepsis with AKI, hypotension. Resolving. Secondary to untreated (noncompliant) MRSA iliopsoas abscess. Also h/o fungemia. BP stable, WBC near normal. Lactic acid quickly returned to normal. No neurologic deficits. Pain controlled. 2. Iliopsoas abscess/MRSA. Continue daptomycin (previous vanc toxicity 05/2015) 3. 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 Continue anidulafungin.  4. AKI secondary to sepsis, suspect ATN, non-oliguric. Improving, Nephrology following.   5. Hyperkalemia secondary to AKI. Resolved. Treated with Kayexylate, D50 and insulin.  6. Thrombocytopenia, suspect secondary to sepsis. 7. Anemia of acute illness, superimposed on anemia of chronic disease secondary to longstanding infection. Stable today. 8. Hematuria, recurrent; seen on previous admissions with improvement with treatment. Needs outpatient f/u with urology. Seen by urology 05/2015 as inpatient. 9. Noncompliance with medical care and medications for unclear reasons 10. Social  issues. 11. Tobacco use disorder   Overall doing better  Continue antibiotics, f/u culture data  Remove PICC line given noncompliance with appropriate care and severe infection. Send tip for culture.   CBC and BMP in AM  Send urine culture  Will transfer to medical bed today  Code Status: Full DVT prophylaxis: SCDs Family Communication:  Discussed plans with patient. He understands and has no questions at this time Disposition Plan: Discharge home in 2-3 days  Brendia Sacks, MD  [facility]  Pager [phone] If 7PM-7AM, please contact night-coverage at [email or web address], password [email or web address] 07/13/2015, 6:22 AM  LOS: 2 days   Consultants:  Nephrology   Procedures:    Antibiotics:  Eraxis 8/19>>  Daptomycin 8/19>>  HPI/Subjective: Was unable to sleep last night. Nausea relieved with antiemetics, pain also improving. Noticed blood in urine. Eating fine. Wife at bedside.  Objective: Filed Vitals:   07/13/15 0100 07/13/15 0200 07/13/15 0400 07/13/15 0500  BP: 108/67 110/70    Pulse: 77 78 70   Temp:    98.5 F (36.9 C)  TempSrc:    Oral  Resp: Height:      Weight:    88.3 kg (194 lb 10.7 oz)  SpO2: 100% 97% 99%     Intake/Output Summary (Last 24 hours) at 07/13/15 0622 Last data filed at 07/13/15 0500  Gross per 24 hour  Intake 3417.5 ml  Output   1000 ml  Net 2417.5 ml     Filed Weights   07/11/15 2200 07/12/15 0406 07/13/15 0500  Weight: 82.7 kg (182 lb 5.1 oz) 94.1 kg (207 lb 7.3 oz) 88.3 kg (194 lb 10.7 oz)    Exam: Afebrile, VSS, No hypoxia General:  Appears calm and comfortable, Lying in  bed Cardiovascular: RRR, no m/r/g. Trace to 1+ edema LE right greater then left.  Telemetry: SR Respiratory: CTA bilaterally, no w/r/r. Normal respiratory effort. Skin: no rash or induration noted  Musculoskeletal: grossly normal tone BUE/BLE. Grossly normal strength BLE sensations still intact. Psychiatric: grossly normal mood and affect,  speech fluent and appropriate  New data reviewed:  WBC 26.3 >>28.8 >> 10.7   Plts down to 104  K+ 4.5  Creatinine slightly improved, down to 1.8  UOP 1000  U/A with TNTC RBC and 21-50 WBC  Pertinent data since admission: Lactic acid 3.08 >> 2.6 >> 1.5  Pending data:  BC  Scheduled Meds: . anidulafungin  100 mg Intravenous Q24H  . DAPTOmycin (CUBICIN)  IV  500 mg Intravenous Q24H  . feeding supplement (ENSURE ENLIVE)  237 mL Oral BID BM  . gabapentin  300 mg Oral TID   Continuous Infusions: . sodium chloride 150 mL/hr at 07/12/15 0116    Principal Problem:   Sepsis Active Problems:   Iliopsoas abscess   MRSA bacteremia   Anemia of chronic disease   Acute kidney injury   Intractable pain   Hematuria   Candidemia   Thrombocytopenia   Time spent 20 minutes  I, Jessica D. Jari Pigg, acting as scribe, recorded this note contemporaneously in the presence of Dr. Melton Alar. Irene Limbo, M.D. on 07/13/2015 .   I have reviewed the above documentation for accuracy and completeness, and I agree with the above. Brendia Sacks, MD

## 2015-07-13 NOTE — Progress Notes (Signed)
Blood cultures gram positive.  MD notified.

## 2015-07-13 NOTE — Progress Notes (Signed)
Pt transferring to unit 300 today. Pt/family is aware and agreeable to transfer. Assessment is unchanged from this morning and receiving RN has been given report. Belongings sent with pt at bedside.  

## 2015-07-14 ENCOUNTER — Inpatient Hospital Stay (HOSPITAL_BASED_OUTPATIENT_CLINIC_OR_DEPARTMENT_OTHER): Payer: Medicaid Other

## 2015-07-14 DIAGNOSIS — R7881 Bacteremia: Secondary | ICD-10-CM | POA: Diagnosis not present

## 2015-07-14 DIAGNOSIS — R319 Hematuria, unspecified: Secondary | ICD-10-CM

## 2015-07-14 DIAGNOSIS — D696 Thrombocytopenia, unspecified: Secondary | ICD-10-CM

## 2015-07-14 DIAGNOSIS — D638 Anemia in other chronic diseases classified elsewhere: Secondary | ICD-10-CM

## 2015-07-14 LAB — BASIC METABOLIC PANEL
ANION GAP: 7 (ref 5–15)
BUN: 18 mg/dL (ref 6–20)
CALCIUM: 8.2 mg/dL — AB (ref 8.9–10.3)
CO2: 24 mmol/L (ref 22–32)
Chloride: 107 mmol/L (ref 101–111)
Creatinine, Ser: 2.17 mg/dL — ABNORMAL HIGH (ref 0.61–1.24)
GFR, EST AFRICAN AMERICAN: 43 mL/min — AB (ref >60)
GFR, EST NON AFRICAN AMERICAN: 37 mL/min — AB (ref >60)
Glucose, Bld: 98 mg/dL (ref 65–99)
Potassium: 3.4 mmol/L — ABNORMAL LOW (ref 3.5–5.1)
Sodium: 138 mmol/L (ref 135–145)

## 2015-07-14 LAB — CBC
HCT: 25 % — ABNORMAL LOW (ref 39.0–52.0)
HEMOGLOBIN: 8.3 g/dL — AB (ref 13.0–17.0)
MCH: 27.1 pg (ref 26.0–34.0)
MCHC: 33.2 g/dL (ref 30.0–36.0)
MCV: 81.7 fL (ref 78.0–100.0)
Platelets: 115 10*3/uL — ABNORMAL LOW (ref 150–400)
RBC: 3.06 MIL/uL — AB (ref 4.22–5.81)
RDW: 15.2 % (ref 11.5–15.5)
WBC: 7.9 10*3/uL (ref 4.0–10.5)

## 2015-07-14 MED ORDER — DEXTROSE 5 % IV SOLN
2.0000 g | INTRAVENOUS | Status: DC
  Administered 2015-07-14: 2 g via INTRAVENOUS
  Filled 2015-07-14 (×3): qty 2

## 2015-07-14 MED ORDER — POTASSIUM CHLORIDE IN NACL 20-0.45 MEQ/L-% IV SOLN
INTRAVENOUS | Status: DC
  Administered 2015-07-14 – 2015-07-15 (×3): via INTRAVENOUS
  Filled 2015-07-14 (×8): qty 1000

## 2015-07-14 NOTE — Progress Notes (Addendum)
CRITICAL VALUE ALERT  Critical value received:  Aerobic blood culture growing gram negative coccobacilli  Date of notification:  07/14/15  Time of notification:  1135  Critical value read back:Yes.    Nurse who received alert:  Dagoberto Ligas, RN  MD notified (1st page):  Dr. Irene Limbo  Time of first page:  1138  MD notified (2nd page):  Time of second page:  Responding MD:  Dr. Irene Limbo  Time MD responded:  [phone]

## 2015-07-14 NOTE — Care Management Note (Signed)
Case Management Note  Patient Details  Name: REGINAL WOJCICKI MRN: [id number] Date of Birth: 1976/08/06  Subjective/Objective:                  Pt readmitted from home with sepsis. Pt lives with family and will return home at discharge. Pt is independent with ADL's. Pt has recent history of noncompliance with IV AB.  Action/Plan: Will continue to follow for discharge planning needs. Awaiting ID recommendations about need for continued IV AB or discharge on po AB.  Expected Discharge Date:                  Expected Discharge Plan:  Home/Self Care  In-House Referral:  Clinical Social Work  Discharge planning Services  CM Consult  Post Acute Care Choice:  NA Choice offered to:  NA  DME Arranged:    DME Agency:     HH Arranged:    HH Agency:     Status of Service:  In process, will continue to follow  Medicare Important Message Given:    Date Medicare IM Given:    Medicare IM give by:    Date Additional Medicare IM Given:    Additional Medicare Important Message give by:     If discussed at Long Length of Stay Meetings, dates discussed:    Additional Comments:  Cheryl Flash, RN 07/14/2015, 3:59 PM

## 2015-07-14 NOTE — Progress Notes (Addendum)
ANTIBIOTIC CONSULT NOTE  Pharmacy Consult for Daptomycin / Eraxis / Rocephin Indication: Hx MRSA Infections / Candidemia / Iliopsoas Abscess   Allergies  Allergen Reactions  . Vicodin [Hydrocodone-Acetaminophen] Hives and Itching  . Adhesive [Tape] Rash   Patient Measurements: Height: 6' (182.9 cm) Weight: 194 lb 10.7 oz (88.3 kg) IBW/kg (Calculated) : 77.6  Vital Signs: Temp: 98.1 F (36.7 C) (08/22 0445) Temp Source: Oral (08/22 0445) BP: 95/53 mmHg (08/22 0445) Pulse Rate: 68 (08/22 0445) Intake/Output from previous day: 08/21 0701 - 08/22 0700 In: 6502.5 [P.O.:480; I.V.:5302.5; IV Piggyback:720] Out: 2700 [Urine:2700] Intake/Output from this shift:   Labs:  Recent Labs  07/12/15 0426 07/12/15 1200 07/13/15 0516 07/14/15 0631  WBC 28.8*  --  10.7* 7.9  HGB 8.2*  --  8.3* 8.3*  PLT 190  --  104* 115*  CREATININE 2.11* 2.00* 1.80* 2.17*   Estimated Creatinine Clearance: 51.7 mL/min (by C-G formula based on Cr of 2.17). No results for input(s): VANCOTROUGH, VANCOPEAK, VANCORANDOM, GENTTROUGH, GENTPEAK, GENTRANDOM, TOBRATROUGH, TOBRAPEAK, TOBRARND, AMIKACINPEAK, AMIKACINTROU, AMIKACIN in the last 72 hours.   Microbiology: Recent Results (from the past 720 hour(s))  Culture, blood (routine x 2)     Status: None   Collection Time: 06/15/15  2:36 PM  Result Value Ref Range Status   Specimen Description BLOOD LEFT ARM  Final   Special Requests BOTTLES DRAWN AEROBIC AND ANAEROBIC 8CC EACH  Final   Culture NO GROWTH 5 DAYS  Final   Report Status 06/20/2015 FINAL  Final  Culture, blood (routine x 2)     Status: None   Collection Time: 06/15/15  2:50 PM  Result Value Ref Range Status   Specimen Description BLOOD RIGHT ARM  Final   Special Requests   Final    BOTTLES DRAWN AEROBIC AND ANAEROBIC AEB=8CC ANA=6CC   Culture NO GROWTH 5 DAYS  Final   Report Status 06/20/2015 FINAL  Final  Blood Culture (routine x 2)     Status: None   Collection Time: 06/25/15  7:10 PM   Result Value Ref Range Status   Specimen Description BLOOD LEFT HAND DRAWN BY RN  Final   Special Requests BOTTLES DRAWN AEROBIC AND ANAEROBIC 6CC  Final   Culture NO GROWTH 5 DAYS  Final   Report Status 06/30/2015 FINAL  Final  Fungus culture, blood     Status: None   Collection Time: 06/25/15  7:35 PM  Result Value Ref Range Status   Specimen Description BLOOD LEFT ARM  Final   Special Requests BOTTLES DRAWN AEROBIC AND ANAEROBIC 6CC  Final   Culture NO GROWTH 7 DAYS  Final   Report Status 07/02/2015 FINAL  Final  MRSA PCR Screening     Status: None   Collection Time: 06/26/15 10:55 AM  Result Value Ref Range Status   MRSA by PCR NEGATIVE NEGATIVE Final    Comment:        The GeneXpert MRSA Assay (FDA approved for NASAL specimens only), is one component of a comprehensive MRSA colonization surveillance program. It is not intended to diagnose MRSA infection nor to guide or monitor treatment for MRSA infections.   Blood culture (routine x 2)     Status: None (Preliminary result)   Collection Time: 07/11/15  8:21 PM  Result Value Ref Range Status   Specimen Description BLOOD LEFT HAND  Final   Special Requests BOTTLES DRAWN AEROBIC AND ANAEROBIC 6CC  Final   Culture NO GROWTH 2 DAYS  Final  Report Status PENDING  Incomplete  Blood culture (routine x 2)     Status: None (Preliminary result)   Collection Time: 07/11/15  8:28 PM  Result Value Ref Range Status   Specimen Description BLOOD LEFT ARM  Final   Special Requests BOTTLES DRAWN AEROBIC AND ANAEROBIC 6CC  Final   Culture NO GROWTH 2 DAYS  Final   Report Status PENDING  Incomplete    Anti-infectives    Start     Dose/Rate Route Frequency Ordered Stop   07/11/15 2200  anidulafungin (ERAXIS) 100 mg in sodium chloride 0.9 % 100 mL IVPB     100 mg over 90 Minutes Intravenous Every 24 hours 07/11/15 2025     07/11/15 2100  DAPTOmycin (CUBICIN) 500 mg in sodium chloride 0.9 % IVPB     500 mg 220 mL/hr over 30 Minutes  Intravenous Every 24 hours 07/11/15 2051     07/11/15 2045  vancomycin (VANCOCIN) IVPB 1000 mg/200 mL premix  Status:  Discontinued     1,000 mg 200 mL/hr over 60 Minutes Intravenous  Once 07/11/15 2038 07/11/15 2146     Assessment: Okay for Protocol, recent admissions for similar symptoms.  Non-compliance with portions of outpatient regimens.  Repeat micro pending.  Worsening renal function w/ Estimated Creatinine Clearance: 51.7 mL/min (by C-G formula based on Cr of 2.17).   No dose adjustments needed at this time.  Goal of Therapy: Eradicate infection.   Plan:  Continue Daptomycin 500mg  IV every 24 hours. Continue Eraxis 100mg  IV every 24 hours per MD. Follow up culture results  Rocephin 2gm IV every 24 hours.  Lamonte Richer R 07/14/2015,8:30 AM

## 2015-07-14 NOTE — Clinical Social Work Note (Signed)
CSW received referral for noncompliance, ?SNF for IV antibiotics. Discussed with MD who requested that he have conversation with pt regarding issues prior to CSW visit. Will follow up when appropriate.  Derenda Fennel, LCSW [phone]

## 2015-07-14 NOTE — Progress Notes (Signed)
Subjective: Patient says that he is not feeling good. Still has back pain. He has some nausea and occasional vomiting. He is able to eat. Denies any difficulty breathing.  Objective: Vital signs in last 24 hours: Temp:  [98.1 F (36.7 C)-101 F (38.3 C)] 98.1 F (36.7 C) (08/22 0445) Pulse Rate:  [68-105] 68 (08/22 0445) Resp:  [18] 18 (08/22 0445) BP: (95-147)/(53-84) 95/53 mmHg (08/22 0445) SpO2:  [98 %-100 %] 98 % (08/22 0445)  Intake/Output from previous day: 08/21 0701 - 08/22 0700 In: 6502.5 [P.O.:480; I.V.:5302.5; IV Piggyback:720] Out: 2700 [Urine:2700] Intake/Output this shift:     Recent Labs  07/11/15 2002 07/12/15 0426 07/13/15 0516 07/14/15 0631  HGB 10.4* 8.2* 8.3* 8.3*    Recent Labs  07/13/15 0516 07/14/15 0631  WBC 10.7* 7.9  RBC 3.11* 3.06*  HCT 26.1* 25.0*  PLT 104* 115*    Recent Labs  07/13/15 0516 07/14/15 0631  NA 140 138  K 4.5 3.4*  CL 111 107  CO2 22 24  BUN 25* 18  CREATININE 1.80* 2.17*  GLUCOSE 116* 98  CALCIUM 8.5* 8.2*    Recent Labs  07/12/15 0046  INR 1.27   Generally patient is alert and in no apparent distress Chest is clear to auscultation His heart exam regular rate and rhythm no murmur no S3 Extremities no edema  Assessment/Plan: Problem #1 acute kidney injury: Possibly prerenal versus ATN. Presently is non-oliguric. Patient had 2700 mL of urine output. His BUN and creatinine is increasing today. Problem #2 hypokalemia: His potassium is declining. Problem #3 sepsis: Presently patient is on anti-biotic'Walker. He is a febrile. His white blood cell count has improved. Problem #4 anemia: Most likely iron deficiency anemia: His hemoglobin is declining Problem #5 history of iliopsoas abscess/MRSA/condyle infection. Problem #6 chronic pain Plan: We'll change IV fluid to half-normal saline with 20 mEq of KCl at 135 mL per hour We'll check his basic metabolic panel in the morning.   [name] 07/14/2015,  8:10 AM

## 2015-07-14 NOTE — Progress Notes (Addendum)
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 He was initially discharged at the end of July with a planned course of daptomycin for 4 weeks and Eraxis for 2 weeks as guided by ID. Noncompliant with f/u at short stay for abx. Readmitted and subsequently discharged 8/5 with plan for [facility]. According to chart, pt left state with PICC line and HH refused further care. Noncompliant with antibiotics x3 weeks. 8/19  presented with PICC line in place, signs of sepsis with a systolic blood pressure in the 70s-80s, heart rate as high as 150, leukocytosis of 26.3, lactic acid of 3.08, a creatinine of 2.06. Admitted for further evaluation and management. Hypotension rapidly resolved with aggressive volume resuscitation, sepsis currently resolved. Treated empirically initially with daptomycin and Eraxis. Review of record demonstrated negative follow-up fungal culture 8/3. Data was reviewed with infectious disease Dr. Luciana Axe, who recommended stopping Eraxis, continuing IV daptomycin while an inpatient and Rocephin. Blood cultures this admission growing gram-negative coccobacilli. On discharge, recommendations for oral Zyvox and oral agent directed at gram-negative coccobacilli based on final culture data. Given the patient's recurrent noncompliance, avoid PICC line placement and IV antibiotics as an outpatient.  Assessment/Plan: 1. Severe sepsis with AKI, hypotension. Sepsis and hypotension resolved. Secondary to untreated (noncompliant) MRSA iliopsoas abscess. Also h/o fungemia.  WBC has normalized. BC pending. 2. Iliopsoas abscess/MRSA. Continue daptomycin (previous vanc toxicity 05/2015) 3. 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 Continue anidulafungin.  4. AKI secondary to sepsis, suspect ATN,  non-oliguric. Nephrology following. No significant change.    5. Hyperkalemia secondary to AKI. Resolved 6. Thrombocytopenia, suspect secondary to sepsis. Improving.  7. Anemia of acute illness, superimposed on anemia of chronic disease secondary to longstanding infection. Stable today. 8. Hematuria, recurrent; seen on previous admissions with improvement with treatment. Needs outpatient f/u with urology. Seen by urology 05/2015 as inpatient. 9. Noncompliance with medical care and medications for unclear reasons 10. Social issues. 11. Tobacco use disorder   Overall doing better with normal BP, normalization of WBC, afebrile.  Continue abx and follow up BC.   Check BMP tomorrow, follow up UC.  Consider new PICC 1-2 days but disposition unclear as patient has been noncompliant with PICC line care (left state) and there is concern for misuse. Discussed with case management and CSW may need placement to complete antibiotics.   Code Status: Full DVT prophylaxis: SCDs Family Communication:  Discussed plans with patient. He understands and has no questions at this time Wife at bedside.  Disposition Plan: Discharge home in 2-3 days  Brendia Sacks, MD  [facility]  Pager [phone] If 7PM-7AM, please contact night-coverage at [email or web address], password [facility] 07/14/2015, 7:05 AM  LOS: 3 days   Consultants:  Nephrology   Procedures:    Antibiotics:  Eraxis 8/19>>  Daptomycin 8/19>>  HPI/Subjective: Not feeling well due to issues with IV placement. Pain in left buttocks is consistent but is able to get up and urinate without difficulty. Nausea and vomiting overnight, no difficulty eating this morning.   Objective: Filed Vitals:   07/13/15 0900 07/13/15 1445 07/13/15 2202 07/14/15 0445  BP: 129/72 147/84 108/65 95/53  Pulse: 102 105 86 68  Temp:  101 F (38.3 C) 98.1 F (36.7 C) 98.1 F (36.7 C)  TempSrc:  Oral Oral Oral  Resp: 18 18 18  18  Height:      Weight:       SpO2: 98% 100% 100% 98%    Intake/Output Summary (Last 24 hours) at 07/14/15 0705 Last data filed at 07/14/15 0636  Gross per 24 hour  Intake 6502.5 ml  Output   2700 ml  Net 3802.5 ml     Filed Weights   07/11/15 2200 07/12/15 0406 07/13/15 0500  Weight: 82.7 kg (182 lb 5.1 oz) 94.1 kg (207 lb 7.3 oz) 88.3 kg (194 lb 10.7 oz)    Exam: Afebrile, VSS, No hypoxia General:  Appears calm and comfortable, Lying in bed Cardiovascular: RRR, no m/r/g. No edema.  Respiratory: CTA bilaterally, no w/r/r. Normal respiratory effort. Abdomen: Soft non tender, no rebound or guarding  Musculoskeletal: grossly normal tone BUE/BLE. Grossly normal strength BLE sensations still intact. Psychiatric: grossly normal mood and affect, speech fluent and appropriate Neurological: Grossly non focal   New data reviewed:  WBC 26.3 >>28.8 >> 10.7>>7.9  Hgb stable 8.3 Plts 115  K+ 3.4  BUN unremarkable. Creatinine slightly higher 2.17  UOP 2700  Pertinent data since admission: Lactic acid 3.08 >> 2.6 >> 1.5  Pending data:  UC  BC  Scheduled Meds: . anidulafungin  100 mg Intravenous Q24H  . DAPTOmycin (CUBICIN)  IV  500 mg Intravenous Q24H  . feeding supplement (ENSURE ENLIVE)  237 mL Oral BID BM  . furosemide  40 mg Intravenous BID  . gabapentin  300 mg Oral TID   Continuous Infusions: . sodium chloride 150 mL/hr at 07/14/15 1610    Principal Problem:   Sepsis Active Problems:   Iliopsoas abscess   Anemia of chronic disease   Acute kidney injury   Intractable pain   Hematuria   Candidemia   Thrombocytopenia   Time spent 25 minutes  I, Jessica D. Jari Pigg, acting as scribe, recorded this note contemporaneously in the presence of Dr. Melton Alar. Irene Limbo, M.D. on 07/14/2015 .   I have reviewed the above documentation for accuracy and completeness, and I agree with the above. Brendia Sacks, MD

## 2015-07-15 DIAGNOSIS — A4102 Sepsis due to Methicillin resistant Staphylococcus aureus: Secondary | ICD-10-CM

## 2015-07-15 LAB — CULTURE, BLOOD (ROUTINE X 2)

## 2015-07-15 LAB — BASIC METABOLIC PANEL
ANION GAP: 9 (ref 5–15)
BUN: 17 mg/dL (ref 6–20)
CHLORIDE: 102 mmol/L (ref 101–111)
CO2: 24 mmol/L (ref 22–32)
CREATININE: 2.7 mg/dL — AB (ref 0.61–1.24)
Calcium: 8.6 mg/dL — ABNORMAL LOW (ref 8.9–10.3)
GFR calc non Af Amer: 29 mL/min — ABNORMAL LOW (ref >60)
GFR, EST AFRICAN AMERICAN: 33 mL/min — AB (ref >60)
Glucose, Bld: 144 mg/dL — ABNORMAL HIGH (ref 65–99)
Potassium: 3.5 mmol/L (ref 3.5–5.1)
SODIUM: 135 mmol/L (ref 135–145)

## 2015-07-15 LAB — CK: CK TOTAL: 19 U/L — AB (ref 49–397)

## 2015-07-15 MED ORDER — LINEZOLID 600 MG PO TABS: 600.0000 mg | ORAL_TABLET | Freq: Two times a day (BID) | ORAL | Status: DC

## 2015-07-15 MED ORDER — GABAPENTIN 300 MG PO CAPS: 300.0000 mg | ORAL_CAPSULE | Freq: Three times a day (TID) | ORAL | Status: DC

## 2015-07-15 MED ORDER — LINEZOLID 600 MG PO TABS
600.0000 mg | ORAL_TABLET | Freq: Two times a day (BID) | ORAL | Status: DC
  Administered 2015-07-15: 600 mg via ORAL
  Filled 2015-07-15 (×3): qty 1

## 2015-07-15 MED ORDER — CIPROFLOXACIN HCL 500 MG PO TABS: 500.0000 mg | ORAL_TABLET | Freq: Two times a day (BID) | ORAL | Status: DC

## 2015-07-15 NOTE — Progress Notes (Addendum)
Patient discharged home today.  Patient was given discharge instructions, prescriptions, and care notes.  Patient refused to sign his discharge instructions and tore up his zyvox voucher and prescriptions.  Patient was upset that he was not going home with a prescription for pain medication.  Dr. Ardyth Harps spoke with the patient and explained the reason for no pain medication prescription.   Case management was made aware that the patient tore up his zyvox voucher.  Patient's IV was removed with catheter intact, no bleeding or complications.  Patient left unit in stable condition by a staff member.

## 2015-07-15 NOTE — Care Management Note (Signed)
Case Management Note  Patient Details  Name: [name] MRN: [id number] Date of Birth: 30-Jan-1976  Subjective/Objective:                    Action/Plan:   Expected Discharge Date:                  Expected Discharge Plan:  Home/Self Care  In-House Referral:  Clinical Social Work  Discharge planning Services  CM Consult, Follow-up appt scheduled  Post Acute Care Choice:  NA Choice offered to:  NA  DME Arranged:    DME Agency:     HH Arranged:    HH Agency:     Status of Service:  Completed, signed off  Medicare Important Message Given:    Date Medicare IM Given:    Medicare IM give by:    Date Additional Medicare IM Given:    Additional Medicare Important Message give by:     If discussed at Long Length of Stay Meetings, dates discussed:    Additional Comments: Pt discharged home today with zyvox po. Zyvox drug company has approved to pay for pts zyvox for 2 weeks. Pt given the voucher to take to pts pharmacy ([facility] in [place]). [facility] will order medication today and they will have medication on 07/16/15 to give to pt. Pt will be given dose of zyvox before leaving hospital for today. Also pt follow up appt made with [facility] Dept and documented on AVS. Pt also made aware. No other CM needs noted. Pt verbalized understanding of above about medications and follow up appts. Arlyss Queen [employer], RN 07/15/2015, 2:02 PM

## 2015-07-15 NOTE — Progress Notes (Signed)
Dr. Ardyth Harps notified that patient's urine culture came back positive for e. coli.

## 2015-07-15 NOTE — Progress Notes (Signed)
[name]  MRN: [id number]  DOB/AGE: Apr 10, 1976 39 y.o.  Primary Care Physician:[name]  Admit date: 07/11/2015  Chief Complaint:  Chief Complaint  Walker presents with  . Generalized Body Aches  . Chills    Walker-Pt presented on  07/11/2015 with  Chief Complaint  Walker presents with  . Generalized Body Aches  . Chills  .    Pt today feels better.   Meds . cefTRIAXone (ROCEPHIN)  IV  2 g Intravenous Q24H  . DAPTOmycin (CUBICIN)  IV  500 mg Intravenous Q24H  . feeding supplement (ENSURE ENLIVE)  237 mL Oral BID BM  . furosemide  40 mg Intravenous BID  . gabapentin  300 mg Oral TID       Physical Exam: Vital signs in last 24 hours: Temp:  [98.1 F (36.7 C)-99.6 F (37.6 C)] 98.7 F (37.1 C) (08/23 0610) Pulse Rate:  [77-97] 77 (08/23 0610) Resp:  [18-20] 18 (08/23 0610) BP: (127-159)/(77-92) 140/77 mmHg (08/23 0610) SpO2:  [96 %-100 %] 96 % (08/23 0610) Weight change:  Last BM Date: 07/14/15  Intake/Output from previous day: 08/22 0701 - 08/23 0700 In: 3732.8 [P.O.:1320; I.V.:2302.8; IV Piggyback:110] Out: 900 [Urine:900]     Physical Exam: General- pt is awake,alert, oriented to time place and person Resp- Adrian acute REsp distress, CTA B/Adrian Adrian Rhonchi CVS- S1S2 regular in rate and rhythm GIT- BS+, soft, NT, ND EXT- Adrian LE Edema, Cyanosis   Lab Results: CBC  Recent Labs  07/13/15 0516 07/14/15 0631  WBC 10.7* 7.9  HGB 8.3* 8.3*  HCT 26.1* 25.0*  PLT 104* 115*    BMET  Recent Labs  07/13/15 0516 07/14/15 0631  NA 140 138  K 4.5 3.4*  CL 111 107  CO2 22 24  GLUCOSE 116* 98  BUN 25* 18  CREATININE 1.80* 2.17*  CALCIUM 8.5* 8.2*   Creat 2016  1.6=>2.1=>1.8--2.1 0.7=> 1.6--1.7 ( July admission)  MICRO Recent Results (from the past 240 hour(Walker))  Blood culture (routine x 2)     Status: None   Collection Time: 07/11/15  8:21 PM  Result Value Ref Range Status   Specimen Description BLOOD LEFT HAND DRAWN BY RN  Final   Special Requests BOTTLES DRAWN AEROBIC AND ANAEROBIC 6CC  Final   Culture  Setup Time   Final    GRAM NEGATIVE COCCOBACILLI AEROBIC BOTTLE ONLY CRITICAL RESULT CALLED TO, READ BACK BY AND VERIFIED WITH: Adrian MAYS,RN AT 1135 07/14/15 BY [name]    Culture   Final    ACINETOBACTER CALCOACETICUS/BAUMANNII COMPLEX Performed at [facility]    Report Status 07/15/2015 FINAL  Final   Organism ID, Bacteria ACINETOBACTER CALCOACETICUS/BAUMANNII COMPLEX  Final      Susceptibility   Acinetobacter calcoaceticus/baumannii complex - MIC*    CEFTAZIDIME 4 SENSITIVE Sensitive     CEFTRIAXONE 16 INTERMEDIATE Intermediate     CIPROFLOXACIN <=0.25 SENSITIVE Sensitive     GENTAMICIN <=1 SENSITIVE Sensitive     IMIPENEM <=0.25 SENSITIVE Sensitive     PIP/TAZO <=4 SENSITIVE Sensitive     TRIMETH/SULFA <=20 SENSITIVE Sensitive     AMPICILLIN/SULBACTAM <=2 SENSITIVE Sensitive     * ACINETOBACTER CALCOACETICUS/BAUMANNII COMPLEX  Blood culture (routine x 2)     Status: None (Preliminary result)   Collection Time: 07/11/15  8:28 PM  Result Value Ref Range Status   Specimen Description BLOOD LEFT ARM  Final   Special Requests BOTTLES DRAWN AEROBIC AND ANAEROBIC 6CC  Final  Culture Adrian GROWTH 3 DAYS  Final   Report Status PENDING  Incomplete      Lab Results  Component Value Date   CALCIUM 8.2* 07/14/2015               Impression: 1)Renal  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                AKI sec to Vanco /MRSA infection                 another episode of ATN sec to sepsis/hypotension                AKI now stable with creat at 1.8--2.1                 NON oliguric                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                 Low total CH50 on  06/13/15                 Hematuria                 Proteinuria in u/Walker- will quantify                 Rise in creat                 Hx of MRSA infection                 Will plan for biopsy if not better                        after infection resolves                        pt has had urology eval and still has hematuria                 2)CVS- hemodynamically stable  3)Anemia HGb stable  4)ID- admitted with Sepsis again On  Dapto and ceftriaxone   5)Electrolytes  Hypokalemia     being replete   NOrmonatremic   6)Acid base Co2 at goal     Plan:  BMet pending from this am Will repeat complements levels to see teh activity of disease. will continue current care      [name] 07/15/2015, 9:02 AM

## 2015-07-15 NOTE — Discharge Summary (Addendum)
Physician Discharge Summary  [name] ZOX:[id number] DOB: 03/25/1976 DOA: 07/11/2015  PCP: No PCP Per Patient  Admit date: 07/11/2015 Discharge date: 07/15/2015  Time spent: 45 minutes  Recommendations for Outpatient Follow-up:  -Will be discharged home today. -WIll be sent home on Zyvox for 2 weeks; will need weekly CBC to monitor blood counts. -CM will arrange for OP follow up with the health department.   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 He was initially discharged at the end of July with a planned course of daptomycin for 4 weeks and Eraxis for 2 weeks as guided by infectious diseases. Plan was for him to come to short stay. He came the first day and then disappeared. He was then readmitted and discharged on August 5 at which time arrangements were made for home health agency to provide antibiotics. Details of what happened are not quite clear to me, patient's excuse is that his wife kicked him out of the house and that because of that he was unable to receive antibiotics. There is a note from the home care agency dated 8/11 stating that the patient left the state with the PICC line in his arm and that his family would not disclose where he was. The home health agency is now refusing to participate in his care with good reason. He already had an incident in the emergency  department today that escalated and culminated in security escorting the wife out of the hospital because she was upset at him for not listening to medical advice. He presents again today with fever and chills. Has has signs of sepsis with a systolic blood pressure in the 70s-80s, heart rate as high as 150, has been afebrile, has a leukocytosis of 26.3, lactic acid of 3.08, a creatinine of 2.06. We have been asked to admit him for further evaluation and management.  Hospital Course:   Severe Sepsis with Shock -Due to iliopsoas abscess/MRSA, acinetobacter bacteremia/fungemia; in the face of non-complaince with medical therapy. -My partner has discussed case with ID, Dr. Luciana Walker, and it is believed that the safest option for [name] will be to DC on PO zyvox for 2 weeks in addition to cipro. -Sepsis parameters quickly improved with supportive measures.  MRSA Bacteremia -After discussing case with ID, the safest option for this patient is for him to be discharged on PO zyvox. -Will plan on a 2 week course. -Will need weekly CBCs to monitor for cytopenias.  Fungemia -Repeat fungal cultures negative. -As per discussion with ID, will stop Eraxis.  Acinetobacter Bacteremia -Will DC on cipro for 7 days.  Substance and Tobacco Abuse -He denies any drug use. -Not interested in assistance with smoking cessation. -Exhibits severe drug-seeking tendencies and he was advised by myself, since admission, that no narcotics would be prescribed upon discharge. He has proceeded to say  a number of profanities today on discharge because I am unwilling to prescribe narcotics. He has been advised to find an OP provider to continue to care for his long-term needs.  Noncompliance -Discussed importance of compliance with medical care.  Procedures:  None   Consultations:  None  Discharge Instructions  Discharge Instructions    Increase activity slowly    Complete by:  As directed               Medication List    STOP taking these medications        anidulafungin 100 mg in sodium chloride 0.9 % 100 mL     DAPTOmycin 500 mg in sodium chloride 0.9 % 100 mL     Oxycodone HCl 10 MG Tabs      TAKE these medications        ciprofloxacin 500 MG tablet  Commonly known as:  CIPRO  Take 1 tablet (500 mg total) by mouth 2 (two) times daily.     gabapentin 300 MG capsule  Commonly known as:  NEURONTIN  Take 1 capsule (300 mg total) by mouth 3 (three) times daily.     linezolid 600 MG tablet  Commonly known as:  ZYVOX  Take 1 tablet (600 mg total) by mouth 2 (two) times daily.     nicotine 21 mg/24hr patch  Commonly known as:  NICODERM CQ - dosed in mg/24 hours  Place 1 patch (21 mg total) onto the skin daily.     polyethylene glycol packet  Commonly known as:  MIRALAX / GLYCOLAX  Take 17 g by mouth 2 (two) times daily. May change to as needed once with regular bowel movements.       Allergies  Allergen Reactions  . Vicodin [Hydrocodone-Acetaminophen] Hives and Itching  . Adhesive [Tape] Rash       Follow-up Information    Follow up In 2 weeks.   Why:  with your new primary care provider       The results of significant diagnostics from this hospitalization (including imaging, microbiology, ancillary and laboratory) are listed below for reference.    Significant Diagnostic Studies: Dg Chest Portable 1 View  07/11/2015   CLINICAL DATA:  Absence on the back. Fever, chills and body aches. Chest pain for 2 days.  EXAM: PORTABLE CHEST - 1 VIEW  COMPARISON:  June 25, 2015  FINDINGS: The heart size and mediastinal contours are within normal limits. Right central venous line is unchanged. There is no focal infiltrate, pulmonary edema, or pleural effusion. The visualized skeletal structures are unremarkable.  IMPRESSION: No active cardiopulmonary disease.   Electronically Signed   By: Sherian Rein M.D.   On: 07/11/2015 21:40   Dg Chest Portable 1 View  06/25/2015    CLINICAL DATA:  Generalized body aches and pain.  History of MRSA.  EXAM: PORTABLE CHEST - 1 VIEW  COMPARISON:  06/11/2015  FINDINGS: Right-sided PICC line in place with tip over the SVC. Lungs are adequately inflated without consolidation or effusion. Cardiomediastinal silhouette and remainder of the exam is unchanged.  IMPRESSION: No active disease.   Electronically Signed   By: Elberta Fortis M.D.   On: 06/25/2015 18:52    Microbiology: Recent Results (from the past 240 hour(s))  Blood culture (routine x 2)     Status: None   Collection Time: 07/11/15  8:21 PM  Result Value Ref Range Status   Specimen Description BLOOD LEFT HAND DRAWN BY RN  Final  Special Requests BOTTLES DRAWN AEROBIC AND ANAEROBIC 6CC  Final   Culture  Setup Time   Final    GRAM NEGATIVE COCCOBACILLI AEROBIC BOTTLE ONLY CRITICAL RESULT CALLED TO, READ BACK BY AND VERIFIED WITH: J MAYS,RN AT 1135 07/14/15 BY L BENFIELD    Culture   Final    ACINETOBACTER CALCOACETICUS/BAUMANNII COMPLEX Performed at [facility]    Report Status 07/15/2015 FINAL  Final   Organism ID, Bacteria ACINETOBACTER CALCOACETICUS/BAUMANNII COMPLEX  Final      Susceptibility   Acinetobacter calcoaceticus/baumannii complex - MIC*    CEFTAZIDIME 4 SENSITIVE Sensitive     CEFTRIAXONE 16 INTERMEDIATE Intermediate     CIPROFLOXACIN <=0.25 SENSITIVE Sensitive     GENTAMICIN <=1 SENSITIVE Sensitive     IMIPENEM <=0.25 SENSITIVE Sensitive     PIP/TAZO <=4 SENSITIVE Sensitive     TRIMETH/SULFA <=20 SENSITIVE Sensitive     AMPICILLIN/SULBACTAM <=2 SENSITIVE Sensitive     * ACINETOBACTER CALCOACETICUS/BAUMANNII COMPLEX  Blood culture (routine x 2)     Status: None (Preliminary result)   Collection Time: 07/11/15  8:28 PM  Result Value Ref Range Status   Specimen Description BLOOD LEFT ARM  Final   Special Requests BOTTLES DRAWN AEROBIC AND ANAEROBIC 6CC  Final   Culture NO GROWTH 3 DAYS  Final   Report Status PENDING  Incomplete      Labs: Basic Metabolic Panel:  Recent Labs Lab 07/12/15 0426 07/12/15 1200 07/13/15 0516 07/14/15 0631 07/15/15 0834  NA 135 139 140 138 135  K 6.6* 4.4 4.5 3.4* 3.5  CL 107 108 111 107 102  CO2 23 25 22 24 24   GLUCOSE 119* 116* 116* 98 144*  BUN 34* 34* 25* 18 17  CREATININE 2.11* 2.00* 1.80* 2.17* 2.70*  CALCIUM 8.1* 8.5* 8.5* 8.2* 8.6*   Liver Function Tests:  Recent Labs Lab 07/11/15 2002 07/12/15 0426 07/13/15 0516  AST 165* 79* 45*  ALT 80* 69* 60  ALKPHOS 241* 164* 173*  BILITOT 1.3* 0.8 0.8  PROT 9.4* 6.8 6.6  ALBUMIN 4.4 3.1* 3.0*   No results for input(s): LIPASE, AMYLASE in the last 168 hours. No results for input(s): AMMONIA in the last 168 hours. CBC:  Recent Labs Lab 07/11/15 2002 07/12/15 0426 07/13/15 0516 07/14/15 0631  WBC 26.3* 28.8* 10.7* 7.9  NEUTROABS 25.3* 24.6*  --   --   HGB 10.4* 8.2* 8.3* 8.3*  HCT 32.1* 25.7* 26.1* 25.0*  MCV 82.3 83.7 83.9 81.7  PLT 213 190 104* 115*   Cardiac Enzymes:  Recent Labs Lab 07/11/15 2002 07/15/15 0834  CKTOTAL  --  19*  TROPONINI <0.03  --    BNP: BNP (last 3 results) No results for input(s): BNP in the last 8760 hours.  ProBNP (last 3 results) No results for input(s): PROBNP in the last 8760 hours.  CBG: No results for input(s): GLUCAP in the last 168 hours.     SignedChaya Jan  [facility] Pager: [phone] 07/15/2015, 10:48 AM

## 2015-07-15 NOTE — Clinical Social Work Note (Signed)
Clinical Social Work Assessment  Patient Details  Name: [name] MRN: [id number] Date of Birth: 09-Sep-1976  Date of referral:  07/15/15               Reason for consult:  Other (Comment Required) (compliance)                Permission sought to share information with:    Permission granted to share information::     Name::        Agency::     Relationship::     Contact Information:     Housing/Transportation Living arrangements for the past 2 months:  Single Family Home Source of Information:  Patient Patient Interpreter Needed:  None Criminal Activity/Legal Involvement Pertinent to Current Situation/Hospitalization:  No - Comment as needed Significant Relationships:  None (per pt) Lives with:  Self Do you feel safe going back to the place where you live?  Yes Need for family participation in patient care:  No (Coment)  Care giving concerns:  Pt independent with ADLs.    Social Worker assessment / plan:  CSW met with pt at bedside. Pt alert and oriented. Reports he has been in and out of hospitals for the past 2 months. Pt was very resistant to answering questions by CSW and refused to make eye contact. He did not understand why CSW was interested in his living arrangements, but finally said he lives alone. Pt states he is married, but they are in the process of separating. He denies any support, but said that he was staying with his sister in [place]. Pt reports he was kicked out here. He had home health arranged for his IV antibiotics at this time. CSW asked if pt tried to notify them of his move, but pt states he didn't have numbers. He ended up in the hospital in [place] for a week, but said "things work differently there" as his reason why he didn't have antibiotics arranged there. Pt became frustrated with assessment and demanded, "Where are you going with this? Just get to the point." CSW explained concern about compliance and that this was for pt's health. He relaxed  somewhat after this. CSW asked pt's plan when he leaves hospital and he states he is not sure where he is going, but he is confident he will be able to stay with someone. At this point, anticipate pt will be able to d/c on PO antibiotics per MD. Pt denies any alcohol or drug use. When asked if he is taking any narcotics, pt responded, "Not anymore."   Employment status:    Insurance information:  Medicaid In [place] PT Recommendations:  Not assessed at this time Information / Referral to community resources:  Other (Comment Required) (pt reports no needs)  Patient/Family's Response to care:  Pt irritated with CSW questions, but then apologized, stating, "I'm not trying to be a butt hole."   Patient/Family's Understanding of and Emotional Response to Diagnosis, Current Treatment, and Prognosis:  Pt reports understanding of importance of antibiotics, but does not appear to be taking this seriously. He states, "Things just came up" which he felt were out of his control and why he could not get IV antibiotics.   Emotional Assessment Appearance:  Appears stated age Attitude/Demeanor/Rapport:  Avoidant Affect (typically observed):  Defensive Orientation:  Oriented to Self, Oriented to Place, Oriented to  Time, Oriented to Situation Alcohol / Substance use:  Other (pt denies) Psych involvement (Current and /or in the community):  No (Comment)  Discharge Needs  Concerns to be addressed:  Compliance Issues Concerns Readmission within the last 30 days:  Yes Current discharge risk:  Lack of support system Barriers to Discharge:  Continued Medical Work up   Salome Arnt, [place] 07/15/2015, 9:11 AM [phone]

## 2015-07-16 LAB — URINE CULTURE: Culture: >=100000

## 2015-07-16 LAB — C4 COMPLEMENT: Complement C4, Body Fluid: 42 mg/dL (ref 14–44)

## 2015-07-16 LAB — CULTURE, BLOOD (ROUTINE X 2): Culture: NO GROWTH

## 2015-07-16 LAB — C3 COMPLEMENT: C3 Complement: 170 mg/dL — ABNORMAL HIGH (ref 82–167)

## 2015-07-16 LAB — COMPLEMENT, TOTAL: Compl, Total (CH50): 59 U/mL (ref 42–60)

## 2015-07-19 LAB — CULTURE, BLOOD (ROUTINE X 2)
CULTURE: NO GROWTH
Culture: NO GROWTH

## 2015-07-21 ENCOUNTER — Ambulatory Visit: Payer: Self-pay | Admitting: Infectious Disease

## 2015-07-21 ENCOUNTER — Ambulatory Visit (INDEPENDENT_AMBULATORY_CARE_PROVIDER_SITE_OTHER): Payer: Medicaid Other | Admitting: Infectious Disease

## 2015-07-21 ENCOUNTER — Encounter: Payer: Self-pay | Admitting: Infectious Disease

## 2015-07-21 VITALS — BP 160/94 | HR 92 | Temp 98.1°F | Wt 188.0 lb

## 2015-07-21 DIAGNOSIS — D696 Thrombocytopenia, unspecified: Secondary | ICD-10-CM | POA: Diagnosis not present

## 2015-07-21 DIAGNOSIS — F1990 Other psychoactive substance use, unspecified, uncomplicated: Secondary | ICD-10-CM | POA: Diagnosis not present

## 2015-07-21 DIAGNOSIS — R7881 Bacteremia: Secondary | ICD-10-CM

## 2015-07-21 DIAGNOSIS — K6812 Psoas muscle abscess: Secondary | ICD-10-CM | POA: Diagnosis not present

## 2015-07-21 DIAGNOSIS — B379 Candidiasis, unspecified: Secondary | ICD-10-CM

## 2015-07-21 DIAGNOSIS — B377 Candidal sepsis: Secondary | ICD-10-CM | POA: Diagnosis not present

## 2015-07-21 DIAGNOSIS — B370 Candidal stomatitis: Secondary | ICD-10-CM | POA: Diagnosis not present

## 2015-07-21 DIAGNOSIS — B9562 Methicillin resistant Staphylococcus aureus infection as the cause of diseases classified elsewhere: Secondary | ICD-10-CM | POA: Diagnosis not present

## 2015-07-21 DIAGNOSIS — A4159 Other Gram-negative sepsis: Secondary | ICD-10-CM | POA: Diagnosis not present

## 2015-07-21 HISTORY — DX: Other gram-negative sepsis: A41.59

## 2015-07-21 HISTORY — DX: Other psychoactive substance use, unspecified, uncomplicated: F19.90

## 2015-07-21 HISTORY — DX: Candidiasis, unspecified: B37.9

## 2015-07-21 LAB — COMPLETE METABOLIC PANEL WITH GFR
ALBUMIN: 3.7 g/dL (ref 3.6–5.1)
ALK PHOS: 135 U/L — AB (ref 40–115)
ALT: 10 U/L (ref 9–46)
AST: 11 U/L (ref 10–40)
BUN: 15 mg/dL (ref 7–25)
CALCIUM: 8.9 mg/dL (ref 8.6–10.3)
CHLORIDE: 104 mmol/L (ref 98–110)
CO2: 28 mmol/L (ref 20–31)
Creat: 2.02 mg/dL — ABNORMAL HIGH (ref 0.60–1.35)
GFR, EST NON AFRICAN AMERICAN: 41 mL/min — AB (ref >=60)
GFR, Est African American: 48 mL/min — ABNORMAL LOW (ref >=60)
Glucose, Bld: 88 mg/dL (ref 65–99)
POTASSIUM: 4.1 mmol/L (ref 3.5–5.3)
SODIUM: 141 mmol/L (ref 135–146)
Total Bilirubin: 0.5 mg/dL (ref 0.2–1.2)
Total Protein: 7.4 g/dL (ref 6.1–8.1)

## 2015-07-21 LAB — CBC WITH DIFFERENTIAL/PLATELET
BASOS PCT: 0 % (ref 0–1)
Basophils Absolute: 0 10*3/uL (ref 0.0–0.1)
EOS PCT: 1 % (ref 0–5)
Eosinophils Absolute: 0.1 10*3/uL (ref 0.0–0.7)
HEMATOCRIT: 28.2 % — AB (ref 39.0–52.0)
HEMOGLOBIN: 9.2 g/dL — AB (ref 13.0–17.0)
Lymphocytes Relative: 27 % (ref 12–46)
Lymphs Abs: 2.1 10*3/uL (ref 0.7–4.0)
MCH: 25.8 pg — AB (ref 26.0–34.0)
MCHC: 32.6 g/dL (ref 30.0–36.0)
MCV: 79.2 fL (ref 78.0–100.0)
MONO ABS: 0.5 10*3/uL (ref 0.1–1.0)
MONOS PCT: 7 % (ref 3–12)
MPV: 8.8 fL (ref 8.6–12.4)
NEUTROS ABS: 5 10*3/uL (ref 1.7–7.7)
Neutrophils Relative %: 65 % (ref 43–77)
Platelets: 333 10*3/uL (ref 150–400)
RBC: 3.56 MIL/uL — AB (ref 4.22–5.81)
RDW: 15.9 % — AB (ref 11.5–15.5)
WBC: 7.7 10*3/uL (ref 4.0–10.5)

## 2015-07-21 LAB — C-REACTIVE PROTEIN: CRP: 1.5 mg/dL — AB (ref <0.60)

## 2015-07-21 MED ORDER — DOXYCYCLINE HYCLATE 100 MG PO TABS: 100.0000 mg | ORAL_TABLET | Freq: Two times a day (BID) | ORAL | Status: DC

## 2015-07-21 MED ORDER — CIPROFLOXACIN HCL 500 MG PO TABS: 500.0000 mg | ORAL_TABLET | Freq: Two times a day (BID) | ORAL | Status: DC

## 2015-07-21 NOTE — Patient Instructions (Signed)
We will get blood work today  Please STOP the LINEZOLID (ZYVOX)  Start doxycyline and continue until reassessed by [place] in ID clinic  I have written for further cipro so that you can complete planned 2 weeks of this antibiotic

## 2015-07-21 NOTE — Progress Notes (Signed)
Subjective:    Patient ID: [name], male    DOB: 04-Oct-1976, 39 y.o.   MRN: [id number]  HPI  39 year old man with suspicion of being an active IVDU who was admitted to [facility] system in June with MRSA bacteremia and iliopsoas abscess sp IR drainage. Apparently patient was initially discharged in early July on IV vancomycin but then readmitted on July 20th and found to candida parapsilosis fungemia. On imaging from June thru July 20th he has shown development of 1. SI joint consistent with septic arthritis and surrounding myositis and soft tissue phlegmon.2.1 cm fluid collection/abscess at the superior aspect of the left iliacus, larger than on prior MRI. New 1.2 cm collection just superior to the left pelvic sidewall  He underwent TEE which was negative for vegetations and was treated with IV datpomycin and IV echinocandin therapy. He received 8 days of both IV drugs in the hospital and was DC with plan for 4 more weeks of IV vancomycin and echinocandin x 2 weeks via an infusion center but he came one day and then disappeared then readmitted and sent home with home health.  However then more chaos ensued with some reports that the patient left the state with PICC line in place and having fired him from their practice. He was readmitted in August now with acinetobacter in blood cultures 1/1 on 07/11/15.   He was now placed on oral cipro and oral zyvox though even prior to DC he had developed thrombocytopenia.   He now presents for follouwup in our clinic though he was not on my schedule for today.  He has improved but persistent pain in his left lower back and hip joint. He requested narcotics from my CMA upon being given orders for labs and when to followup with [place].  He denied IVDU to me but he is clearly not being truthful.   Review of Systems  Constitutional: Positive for appetite change. Negative for fever, chills, diaphoresis, activity change, fatigue and unexpected weight  change.  HENT: Negative for congestion, rhinorrhea, sinus pressure, sneezing, sore throat and trouble swallowing.   Eyes: Negative for photophobia and visual disturbance.  Respiratory: Negative for cough, chest tightness, shortness of breath, wheezing and stridor.   Cardiovascular: Negative for chest pain, palpitations and leg swelling.  Gastrointestinal: Negative for nausea, vomiting, abdominal pain, diarrhea, constipation, blood in stool, abdominal distention and anal bleeding.  Genitourinary: Negative for dysuria, hematuria, flank pain and difficulty urinating.  Musculoskeletal: Positive for myalgias, back pain, arthralgias and gait problem. Negative for joint swelling.  Skin: Negative for color change, pallor, rash and wound.  Neurological: Negative for dizziness, tremors, weakness and light-headedness.  Hematological: Negative for adenopathy. Does not bruise/bleed easily.  Psychiatric/Behavioral: Negative for behavioral problems, confusion, sleep disturbance, dysphoric mood, decreased concentration and agitation.       Objective:   Physical Exam  Constitutional: He is oriented to person, place, and time. He appears well-developed and well-nourished.  HENT:  Head: Normocephalic and atraumatic.  Eyes: Conjunctivae and EOM are normal.  Neck: Normal range of motion. Neck supple.  Cardiovascular: Normal rate, regular rhythm and normal heart sounds.  Exam reveals no gallop and no friction rub.   No murmur heard. Pulmonary/Chest: Effort normal and breath sounds normal. No respiratory distress. He has no wheezes.  Abdominal: Soft. He exhibits no distension.  Musculoskeletal: He exhibits no edema or tenderness.       Left hip: He exhibits decreased range of motion.  Pain in  joint and lower back with straight leg raise  Neurological: He is alert and oriented to person, place, and time.  Skin: Skin is warm and dry. No rash noted. No erythema. No pallor.  Psychiatric: His speech is delayed.  He is slowed.          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 I will plan on trying to get him through a year of treatment. He may need further radiologic drainage and potentially even surgery but his likely IVDU--he denies this but he evidence for it is abundantly clear complicate this. May need imaging sometime in near future  Will get labs today including ESR and CRP  #3 Candidemia: never received 2 straight weeks of antifungal therapy Got 8 days straight at one point and another 5-6 days in house. Candidemia did not yet recur however.   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   I spent greater than 45  minutes with the patient including greater than 50% of time in face to face counsel of the patient re his MRSA bactermia, psoas, ilacus abscess, fungemia and acintobacter bacteremia and in coordination of their care.

## 2015-07-22 LAB — SEDIMENTATION RATE: Sed Rate: 97 mm/hr — ABNORMAL HIGH (ref 0–15)

## 2015-07-23 ENCOUNTER — Emergency Department (HOSPITAL_COMMUNITY): Payer: Medicaid Other

## 2015-07-23 ENCOUNTER — Encounter (HOSPITAL_COMMUNITY): Payer: Self-pay

## 2015-07-23 ENCOUNTER — Emergency Department (HOSPITAL_COMMUNITY)
Admission: EM | Admit: 2015-07-23 | Discharge: 2015-07-24 | Disposition: A | Discharge status: Home / Self Care | Payer: Medicaid Other | Attending: Emergency Medicine | Admitting: Emergency Medicine

## 2015-07-23 DIAGNOSIS — Z8719 Personal history of other diseases of the digestive system: Secondary | ICD-10-CM | POA: Diagnosis not present

## 2015-07-23 DIAGNOSIS — R1030 Lower abdominal pain, unspecified: Secondary | ICD-10-CM | POA: Diagnosis not present

## 2015-07-23 DIAGNOSIS — Z79899 Other long term (current) drug therapy: Secondary | ICD-10-CM | POA: Insufficient documentation

## 2015-07-23 DIAGNOSIS — Z8619 Personal history of other infectious and parasitic diseases: Secondary | ICD-10-CM | POA: Insufficient documentation

## 2015-07-23 DIAGNOSIS — G8929 Other chronic pain: Secondary | ICD-10-CM | POA: Insufficient documentation

## 2015-07-23 DIAGNOSIS — R079 Chest pain, unspecified: Secondary | ICD-10-CM | POA: Diagnosis not present

## 2015-07-23 DIAGNOSIS — Z720 Tobacco use: Secondary | ICD-10-CM | POA: Diagnosis not present

## 2015-07-23 DIAGNOSIS — M545 Low back pain: Secondary | ICD-10-CM | POA: Diagnosis not present

## 2015-07-23 DIAGNOSIS — Z87828 Personal history of other (healed) physical injury and trauma: Secondary | ICD-10-CM | POA: Diagnosis not present

## 2015-07-23 DIAGNOSIS — M549 Dorsalgia, unspecified: Secondary | ICD-10-CM

## 2015-07-23 DIAGNOSIS — Z8614 Personal history of Methicillin resistant Staphylococcus aureus infection: Secondary | ICD-10-CM | POA: Diagnosis not present

## 2015-07-23 LAB — BASIC METABOLIC PANEL
Anion gap: 9 (ref 5–15)
BUN: 12 mg/dL (ref 6–20)
CALCIUM: 9.2 mg/dL (ref 8.9–10.3)
CO2: 28 mmol/L (ref 22–32)
CREATININE: 1.99 mg/dL — AB (ref 0.61–1.24)
Chloride: 105 mmol/L (ref 101–111)
GFR calc non Af Amer: 41 mL/min — ABNORMAL LOW (ref >60)
GFR, EST AFRICAN AMERICAN: 48 mL/min — AB (ref >60)
GLUCOSE: 102 mg/dL — AB (ref 65–99)
Potassium: 3.9 mmol/L (ref 3.5–5.1)
Sodium: 142 mmol/L (ref 135–145)

## 2015-07-23 LAB — I-STAT TROPONIN, ED: TROPONIN I, POC: 0 ng/mL (ref 0.00–0.08)

## 2015-07-23 LAB — URINE MICROSCOPIC-ADD ON

## 2015-07-23 LAB — CBC
HCT: 32.3 % — ABNORMAL LOW (ref 39.0–52.0)
Hemoglobin: 10.3 g/dL — ABNORMAL LOW (ref 13.0–17.0)
MCH: 26 pg (ref 26.0–34.0)
MCHC: 31.9 g/dL (ref 30.0–36.0)
MCV: 81.6 fL (ref 78.0–100.0)
PLATELETS: 333 10*3/uL (ref 150–400)
RBC: 3.96 MIL/uL — ABNORMAL LOW (ref 4.22–5.81)
RDW: 14.8 % (ref 11.5–15.5)
WBC: 7 10*3/uL (ref 4.0–10.5)

## 2015-07-23 LAB — URINALYSIS, ROUTINE W REFLEX MICROSCOPIC
Bilirubin Urine: NEGATIVE
GLUCOSE, UA: NEGATIVE mg/dL
KETONES UR: NEGATIVE mg/dL
NITRITE: NEGATIVE
PH: 7 (ref 5.0–8.0)
Protein, ur: 30 mg/dL — AB
SPECIFIC GRAVITY, URINE: 1.012 (ref 1.005–1.030)
Urobilinogen, UA: 0.2 mg/dL (ref 0.0–1.0)

## 2015-07-23 MED ORDER — GADOBENATE DIMEGLUMINE 529 MG/ML IV SOLN: 10.0000 mL | Freq: Once | INTRAVENOUS | Status: DC | PRN

## 2015-07-23 MED ORDER — ONDANSETRON HCL 4 MG/2ML IJ SOLN
4.0000 mg | Freq: Once | INTRAMUSCULAR | Status: AC
  Administered 2015-07-23: 4 mg via INTRAVENOUS
  Filled 2015-07-23: qty 2

## 2015-07-23 MED ORDER — HYDROMORPHONE HCL 1 MG/ML IJ SOLN
1.0000 mg | Freq: Once | INTRAMUSCULAR | Status: AC
  Administered 2015-07-23: 1 mg via INTRAVENOUS
  Filled 2015-07-23: qty 1

## 2015-07-23 MED ORDER — IOHEXOL 300 MG/ML  SOLN
100.0000 mL | Freq: Once | INTRAMUSCULAR | Status: AC | PRN
  Administered 2015-07-23: 100 mL via INTRAVENOUS

## 2015-07-23 NOTE — ED Notes (Signed)
Patient c/o chest pain and back pain x 1 week. Patient states he had "A terrible infection" and then he went out of town and PICC line had not been flushed. Patient had the PICC line removed last week due to possible infection.

## 2015-07-23 NOTE — ED Notes (Addendum)
Attempted to take pt. IV out per EDP,Gentry, pt. Stated "You aren't going to take out my IV until I get my discharge papers and you are going to have to hold me down to take out my IV." pt. removed own IV and left the building. Notified RN,Erica.

## 2015-07-23 NOTE — ED Provider Notes (Signed)
CSN: [id number]     Arrival date & time 07/23/15  1618 History   First MD Initiated Contact with Patient 07/23/15 1712     Chief Complaint  Patient presents with  . Chest Pain  . Back Pain     (Consider location/radiation/quality/duration/timing/severity/associated sxs/prior Treatment) Patient is a 39 y.o. male presenting with back pain.  Back Pain Location:  Lumbar spine Quality:  Stabbing Radiates to:  L thigh Pain severity:  Severe Pain is:  Same all the time Onset quality:  Gradual Duration:  3 days Timing:  Constant Progression:  Worsening Chronicity:  Chronic Context comment:  Complicated illiopsoas muscle abscess sp drainage, intermittent compliance with abx Relieved by:  Nothing Worsened by:  Movement, standing, touching and twisting Ineffective treatments:  None tried Associated symptoms: chest pain (intermittently)   Associated symptoms: no bladder incontinence, no bowel incontinence, no fever, no numbness, no perianal numbness and no tingling     Past Medical History  Diagnosis Date  . H/O back injury   . MRSA (methicillin resistant Staphylococcus aureus)   . 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 MRSA bacteremia 04/2015  . MRSA bacteremia 07/21/2015  . Sepsis due to Acinetobacter 07/21/2015  . Candida parapsilosis infection 07/21/2015  . IV drug user 07/21/2015   Past Surgical History  Procedure Laterality Date  . Skin biopsy    . Abdmoninal drain insertion    . Tee without cardioversion N/A 06/13/2015    Procedure: TRANSESOPHAGEAL ECHOCARDIOGRAM (TEE);  Surgeon: Wendall Stade, MD;  Location: [facility];  Service: Cardiovascular;  Laterality: N/A;   Family History  Problem Relation Age of Onset  . Cancer Father    Social History  Substance Use Topics  . Smoking status: Current Every Day Smoker -- 0.50 packs/day for 20 years    Types: Cigarettes  . Smokeless tobacco: Never Used  . Alcohol Use: No    Review of Systems  Constitutional: Negative for fever.   Cardiovascular: Positive for chest pain (intermittently).  Gastrointestinal: Negative for bowel incontinence.  Genitourinary: Negative for bladder incontinence.  Musculoskeletal: Positive for back pain.  Neurological: Negative for tingling and numbness.  All other systems reviewed and are negative.     Allergies  Vicodin and Adhesive  Home Medications   Prior to Admission medications   Medication Sig Start Date End Date Taking? Authorizing Provider  ciprofloxacin (CIPRO) 500 MG tablet Take 1 tablet (500 mg total) by mouth 2 (two) times daily. 07/21/15   Randall Hiss, MD  doxycycline (VIBRA-TABS) 100 MG tablet Take 1 tablet (100 mg total) by mouth 2 (two) times daily. 07/21/15   Randall Hiss, MD  gabapentin (NEURONTIN) 300 MG capsule Take 1 capsule (300 mg total) by mouth 3 (three) times daily. 07/15/15   Henderson Cloud, MD  nicotine (NICODERM CQ - DOSED IN MG/24 HOURS) 21 mg/24hr patch Place 1 patch (21 mg total) onto the skin daily. Patient not taking: Reported on 06/25/2015 06/19/15   Gwenyth Bender, NP  polyethylene glycol [facility] / Ethelene Hal) packet Take 17 g by mouth 2 (two) times daily. May change to as needed once with regular bowel movements. Patient not taking: Reported on 05/28/2015 05/23/15   Osvaldo Shipper, MD   BP 133/77 mmHg  Pulse 78  Temp(Src) 98.9 F (37.2 C) (Oral)  Resp 19  Ht 6' (1.829 m)  Wt 186 lb (84.369 kg)  BMI 25.22 kg/m2  SpO2 99% Physical Exam  Constitutional: He is oriented  to person, place, and time. He appears well-developed and well-nourished.  HENT:  Head: Normocephalic and atraumatic.  Eyes: Conjunctivae and EOM are normal.  Neck: Normal range of motion. Neck supple.  Cardiovascular: Normal rate, regular rhythm and normal heart sounds.   Pulmonary/Chest: Effort normal and breath sounds normal. No respiratory distress.  Abdominal: He exhibits no distension. There is no tenderness. There is no rebound and no guarding.   Musculoskeletal: Normal range of motion.       Lumbar back: He exhibits tenderness and bony tenderness.  Straight leg positive on L  Neurological: He is alert and oriented to person, place, and time.  Skin: Skin is warm and dry.  Vitals reviewed.   ED Course  Procedures (including critical care time) Labs Review Labs Reviewed  BASIC METABOLIC PANEL - Abnormal; Notable for the following:    Glucose, Bld 102 (*)    Creatinine, Ser 1.99 (*)    GFR calc non Af Amer 41 (*)    GFR calc Af Amer 48 (*)    All other components within normal limits  CBC - Abnormal; Notable for the following:    RBC 3.96 (*)    Hemoglobin 10.3 (*)    HCT 32.3 (*)    All other components within normal limits  URINALYSIS, ROUTINE W REFLEX MICROSCOPIC (NOT AT [facility]) - Abnormal; Notable for the following:    APPearance CLOUDY (*)    Hgb urine dipstick LARGE (*)    Protein, ur 30 (*)    Leukocytes, UA TRACE (*)    All other components within normal limits  URINE MICROSCOPIC-ADD ON - Abnormal; Notable for the following:    Squamous Epithelial / LPF FEW (*)    All other components within normal limits  I-STAT TROPOININ, ED    Imaging Review Dg Chest 2 View  07/23/2015   CLINICAL DATA:  Chest pain all over. Shortness of breath for 2 days. History of smoking.  EXAM: CHEST  2 VIEW  COMPARISON:  07/11/2015  FINDINGS: Cardiomediastinal silhouette is within normal limits. Lungs are free of focal consolidations and pleural effusions. No pulmonary edema. Degenerative changes are seen in the lower thoracic spine.  IMPRESSION: No evidence for acute  abnormality.   Electronically Signed   By: Norva Pavlov M.D.   On: 07/23/2015 17:02   Mr Lumbar Spine Wo Contrast  07/23/2015   CLINICAL DATA:  Initial evaluation for acute back pain.  EXAM: MRI LUMBAR SPINE WITHOUT CONTRAST  TECHNIQUE: Multiplanar, multisequence MR imaging of the lumbar spine was performed. No intravenous contrast was administered.  COMPARISON:  Prior  MRI from 06/11/2015.  FINDINGS: Study is severely limited by motion artifact.  Vertebral bodies are normally aligned with preservation of the normal lumbar lordosis. Vertebral body heights are preserved. No fracture or listhesis.  There is persistent abnormal marrow edema centered about the left SI joint. Left SI joint is asymmetrically widened with erosive changes. Findings again concerning for septic arthritis. Direct comparison with prior study somewhat limited due to extensive motion artifact on this exam an lack of IV contrast. No definite fluid collection, although again examination is limited. No other abnormal bone marrow edema or evidence for infection. The paraspinous soft tissues are grossly unremarkable. No psoas abscess. Previously identified collection at the anterior aspect of the left iliacus not definitely seen on this exam.  Conus medullaris terminates normally at the L1-2 level. Signal intensity within the visualized cord is normal. Nerve roots of the cauda equina within normal limits.  No significant degenerative changes at the T12-L1 through L3-4 levels.  L4-5: Degenerative disc desiccation with disc bulge. Superimposed facet and ligamentous hypertrophy. Probable superimposed central disc protrusion again noted. Mild canal and bilateral foraminal narrowing is stable. There is resultant  L5-S1: Circumferential disc bulge with disc desiccation again seen, is stable from previous. No focal disc herniation. Superimposed mild facet hypertrophy. Mild lateral recess stenosis again seen bilaterally, grossly similar. Mild bilateral foraminal narrowing also not significantly changed.  IMPRESSION: 1. Limited study due to motion artifact and lack of IV contrast. 2. Persistent abnormal marrow edema centered about the left SI joint with asymmetric joint space widening and erosive changes. Findings are suspicious for persistent septic arthritis, although please note that MRI changes/improvement usually lag  clinical improvement. No abscess or loculated fluid collection identified on this limited exam. No evidence for new infection within the lumbar spine. 3. 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   Electronically Signed   By: Rise Mu M.D.   On: 07/23/2015 23:06   Ct Abdomen Pelvis W Contrast  07/23/2015   ADDENDUM REPORT: 07/23/2015 21:37  ADDENDUM: There is residual asymmetric prominence of the left iliacus muscle, with mildly decreased attenuation. This may reflect residual myositis. No definite abscess is seen. Mild soft tissue stranding is seen along the left pelvic sidewall.  The cortical irregularity and sclerosis at the left sacroiliac joint reflects changes of sacroiliitis. The irregularity is somewhat more prominent than on the prior study, raising question for mild progression of osteomyelitis, either acute or somewhat chronic in nature. MRI would be helpful for further evaluation, as deemed clinically appropriate.   Electronically Signed   By: Roanna Raider M.D.   On: 07/23/2015 21:37   07/23/2015   CLINICAL DATA:  Chronic lower abdominal pain for 2 months, with 60 pounds of weight loss. Admitted with Acinetobacter bacteremia. Initial encounter.  EXAM: CT ABDOMEN AND PELVIS WITH CONTRAST  TECHNIQUE: Multidetector CT imaging of the abdomen and pelvis was performed using the standard protocol following bolus administration of intravenous contrast.  CONTRAST:  OMNIPAQUE IOHEXOL 300 MG/ML  SOLN  COMPARISON:  CT of the abdomen and pelvis performed 06/09/2015, and renal ultrasound performed 06/12/2015  FINDINGS: The visualized lung bases are clear.  The liver is unremarkable in appearance. Scattered calcified granulomata are seen within the spleen. The gallbladder is within normal limits. The pancreas and adrenal glands are unremarkable.  The kidneys are unremarkable in appearance. There is no evidence of hydronephrosis. No renal or ureteral stones are seen. No perinephric  stranding is appreciated.  No free fluid is identified. The small bowel is unremarkable in appearance. The stomach is within normal limits. No acute vascular abnormalities are seen. Minimal calcification is noted along the abdominal aorta and its branches.  The appendix is normal in caliber and contains air, without evidence of appendicitis. The colon is unremarkable in appearance.  The bladder is mildly distended. Apparent bladder wall thickening may reflect chronic inflammation or possibly cystitis. Would correlate for associated symptoms. The prostate remains normal in size. No inguinal lymphadenopathy is seen.  No acute osseous abnormalities are identified. There is widening of the left sacroiliac joint and associated cortical irregularity; this appears to reflect a chronic healed fracture.  IMPRESSION: 1. Apparent bladder wall thickening may reflect chronic inflammation or possibly cystitis. Would correlate for associated symptoms. 2. Widening of the left sacroiliac joint and associated cortical irregularity. This appears to reflect a chronic healed fracture. 3. Otherwise unremarkable contrast-enhanced CT of the  abdomen and pelvis.  Electronically Signed: By: Roanna Raider M.D. On: 07/23/2015 20:18   I have personally reviewed and evaluated these images and lab results as part of my medical decision-making.   EKG Interpretation   Date/Time:  Wednesday July 23 2015 16:27:44 EDT Ventricular Rate:  107 PR Interval:  123 QRS Duration: 66 QT Interval:  303 QTC Calculation: 404 R Axis:   27 Text Interpretation:  Sinus or ectopic atrial tachycardia Atrial premature  complex Repol abnrm suggests ischemia, diffuse leads Minimal ST elevation,  inferior leads No significant change since last tracing Confirmed by  Mirian Mo [phone]) on 07/23/2015 5:13:45 PM      MDM   Final diagnoses:  Back pain    39 y.o. male with pertinent PMH of MRSA abscess of iliopsoas, as well as bactremia and SI OM  sp prior abd drain presents with recurrent back pain.  There has been strong concern for malingering and drug-seeking behavior in the patient in the past.  No trauma, pt states he has been subjectively febrile but no measured fever.  No GI symptoms.  He was seen by ID 2 days ago and switched to doxy and cipro from cipro and zyvox due to thrombocytopenia.      On arrival today the patient is not forthcoming regarding his complicated course, including that he states that they had to remove his PICC line due to "they didn't put something in it".  In reality, the patient had his PICC line removed after he fled the state and was noncompliant with multiple appointments and there was concern for drug abuse.    Wu as above without change from prior.  I spoke with ID re: same (in fact the pt's ID physician) who recommended continued plan of po abx.  I questioned the pt at length re: his compliance, and he states he is not currently on any antibiotics.  I strongly encouraged him to take his abx as prescribed, but the pt became irate when I told him I was not able to provide him with a prescription for narcotics.  I attempted to allay concerns and sat in the pt's room for approximately 10 min while he continuously raised his voice and told me I was "assaulting" him with regards to informing him of the reasons why I could not prescribe narcotics.  I examined the patient in the [place] controlled substance database and he has multiple prescriptions from different providers. I am extremely concerned that the patient is drug-seeking on top of his real disease. Despite my best efforts, the patient refused to listen to instructions on taking antibiotics or to further discuss his care, including UA, and insisted on leaving. He attempted to walk out with his IV and despite the fact that I told him this wasn't possible.  He then refused to let us remove his IV and this necessitated calling security. The patient then removed  his own IV and walked out.    I have reviewed all laboratory and imaging studies if ordered as above  1. Back pain         Mirian Mo, MD 07/24/15 0001

## 2015-07-23 NOTE — ED Notes (Signed)
Patient transported to MRI 

## 2015-07-23 NOTE — Progress Notes (Signed)
Patient noted to have been discharged from the hospital on 08/23.  Patient was discharged on Zyvox per previous CM note was awarded 2 weeks free of Zyvox through drug company.  Patient was also scheduled follow up appointment with [facility] as this is his pcp list on his Medicaid [employer] card.  Patient was seen in ID clinic on 08/29.

## 2015-07-24 NOTE — ED Notes (Signed)
Witnessed patient yelling at staff.  Patient left without receiving paperwork and was visibly upset without reason given.  Pt ambulating with steady gait upon leaving.  IV was removed by patient prior to leaving.

## 2015-10-30 ENCOUNTER — Other Ambulatory Visit: Payer: Self-pay | Admitting: Advanced Practice Midwife

## 2015-10-30 MED ORDER — METRONIDAZOLE 500 MG PO TABS: 2000.0000 mg | ORAL_TABLET | Freq: Once | ORAL | Status: DC

## 2015-10-30 NOTE — Progress Notes (Signed)
Wife has Trich. Rx flagyl 2gm PO X once

## 2015-11-18 NOTE — Telephone Encounter (Signed)
error 

## 2016-05-02 IMAGING — CT CT ABD-PELV W/ CM
2 of 4 series · 11 of 46 positions shown, 12 images · IV contrast (omnipaque)
Comparison: CT dated 05/22/2015 and 06/02/2015

CLINICAL DATA: 38-year-old male with history of left psoas abscess
and drainage presenting with abdominal pain

EXAM:
CT ABDOMEN AND PELVIS WITH CONTRAST
TECHNIQUE: Multidetector CT imaging of the abdomen and pelvis was performed
using the standard protocol following bolus administration of
intravenous contrast.
CONTRAST:  100mL OMNIPAQUE IOHEXOL 300 MG/ML  SOLN

[Series 201: routine, idose (2) · axial · 0.78mm/px · z∈[+47,+477]mm · 8 of 104 slices shown, 9 images]
[im 9/104  soft-tissue]
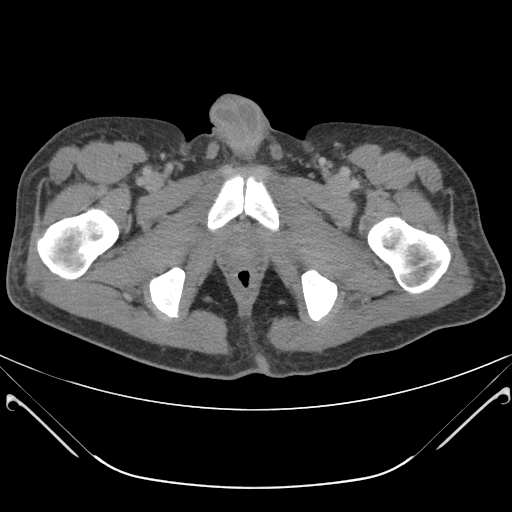
[im 9/104  bone]
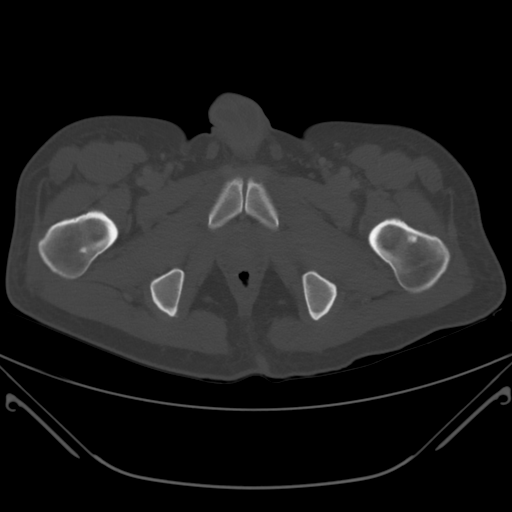
[im 22/104  soft-tissue]
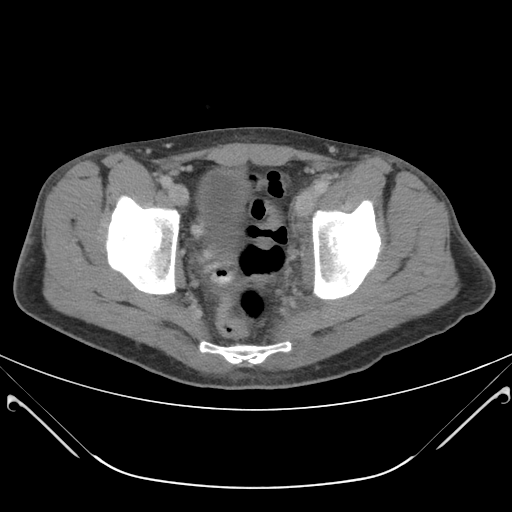
[im 35/104  soft-tissue]
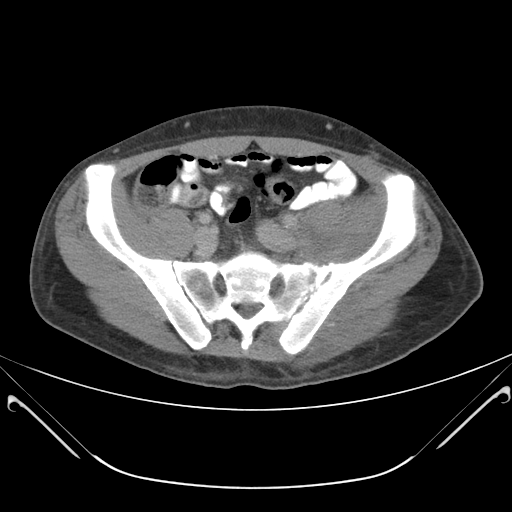
[im 48/104  soft-tissue]
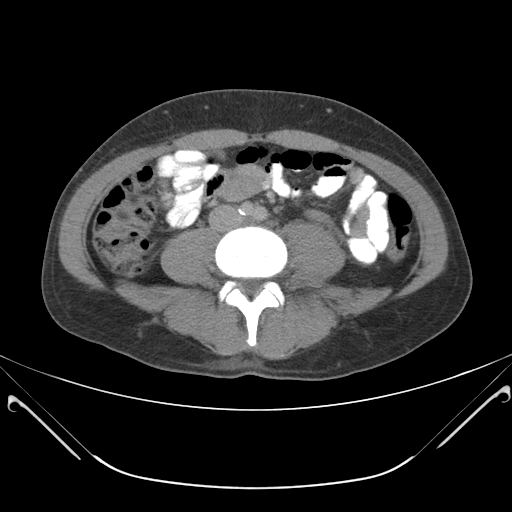
[im 56/104  soft-tissue]
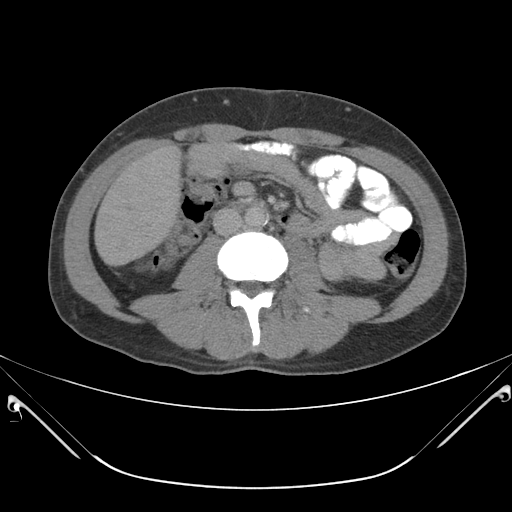
[im 69/104  soft-tissue]
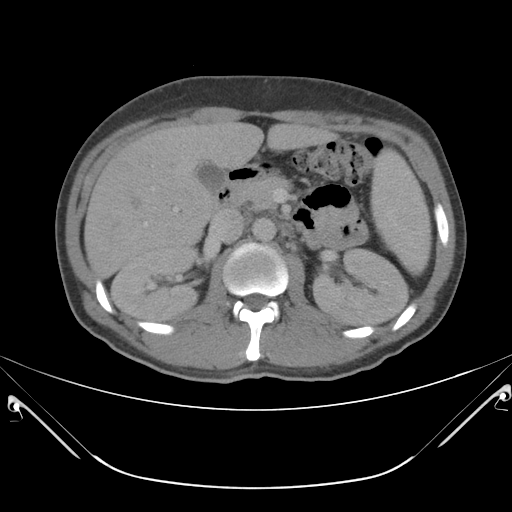
[im 82/104  soft-tissue]
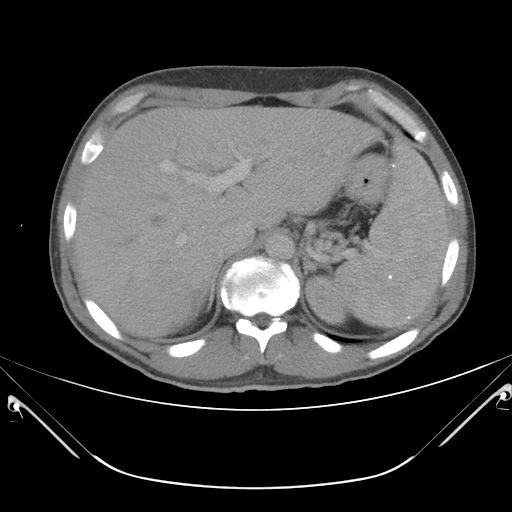
[im 95/104  soft-tissue]
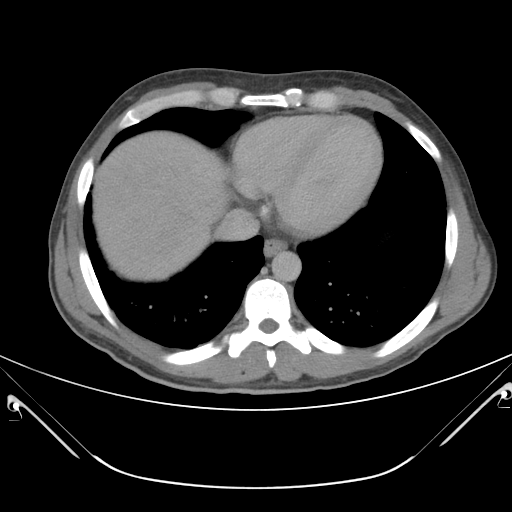

[Series 203: coronals, idose (2) · coronal · 0.45mm/px · 3 of 107 slices shown]
[im 36/107  soft-tissue]
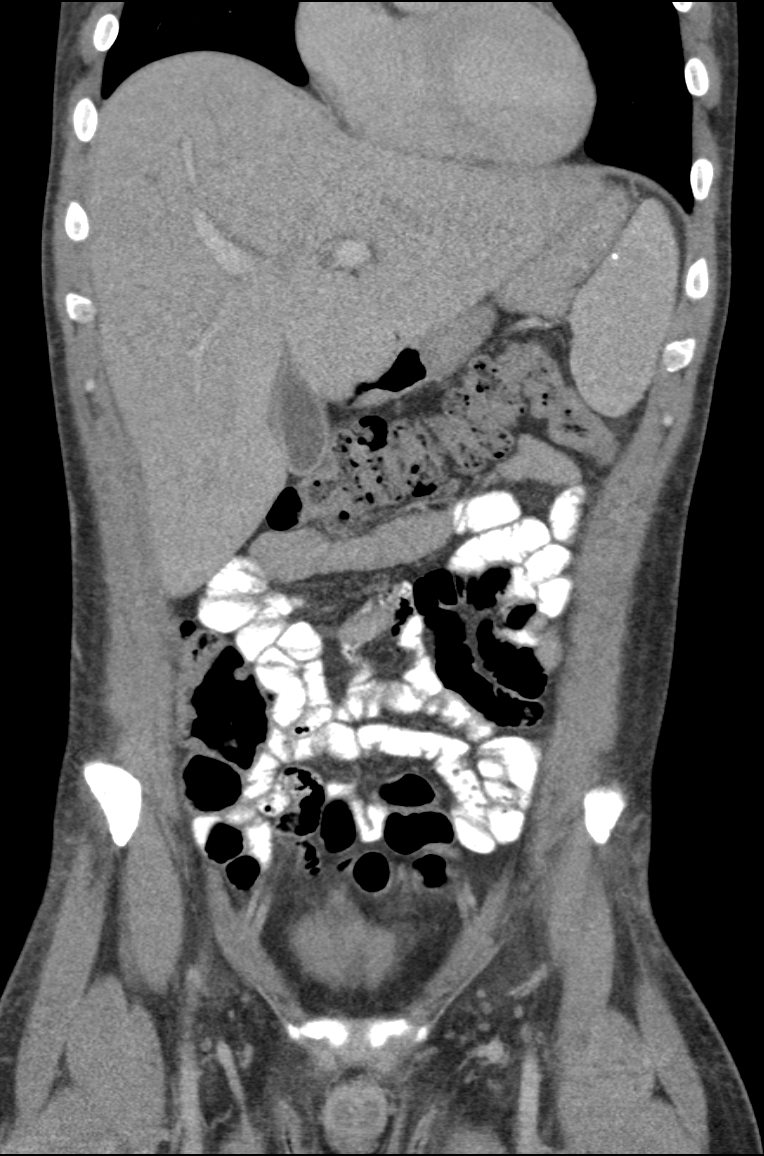
[im 48/107  soft-tissue]
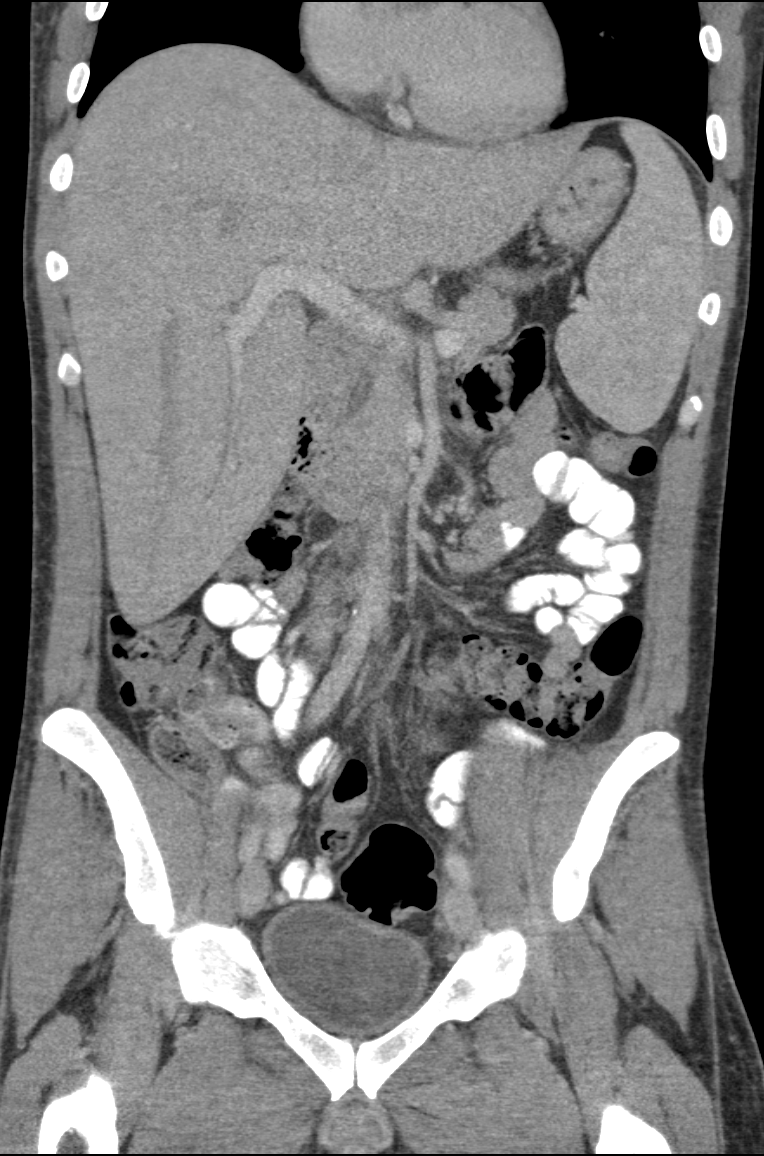
[im 59/107  soft-tissue]
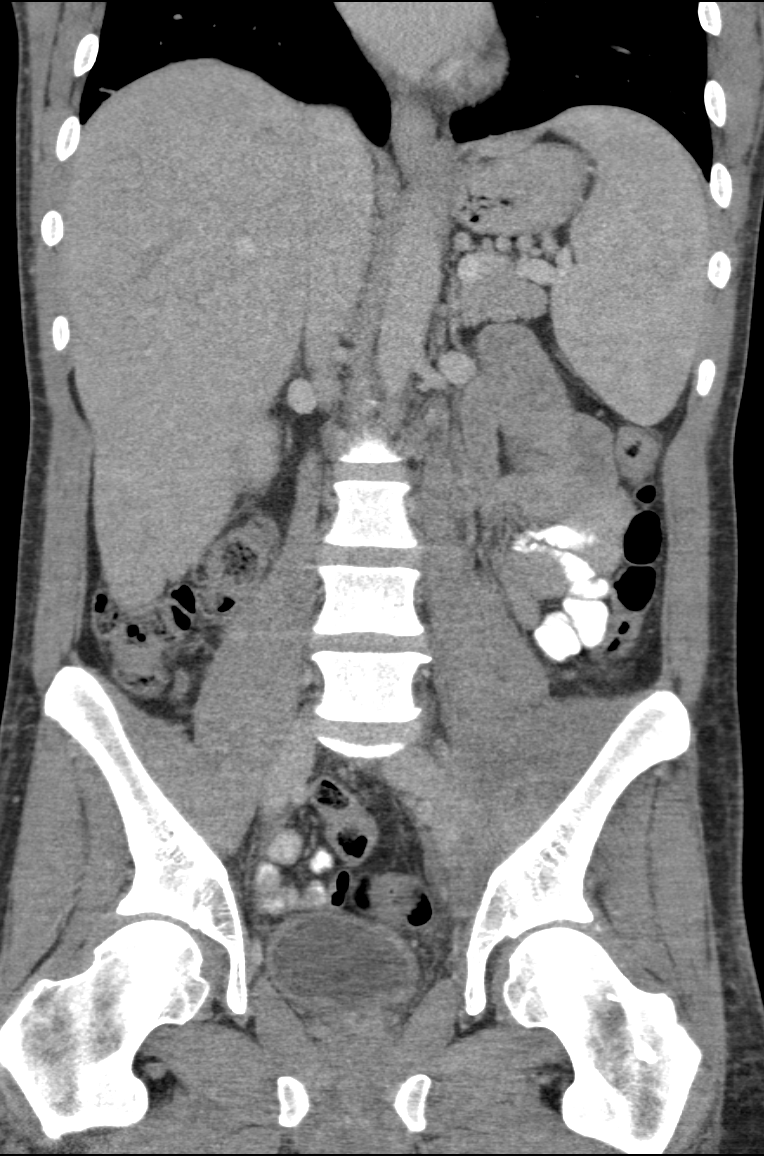

[11 of 46 positions shown; findings below may reference images not displayed]

FINDINGS: The visualized lung bases are clear. No intra-abdominal free air or
free fluid.

Trace pericholecystic fluid may be present. There is mild periportal
edema. The liver is otherwise unremarkable. No gallstone identified.
The pancreas appears unremarkable. Splenomegaly measuring up to 16
cm. The adrenal glands, kidneys, visualized ureters and urinary
bladder appear unremarkable. The prostate and seminal vesicles are
grossly unremarkable.

Moderate stool throughout the colon. No evidence of bowel
obstruction or inflammation. Normal appendix.

The visualized abdominal aorta and IVC are patent. No portal venous
gas identified. There is no tendinopathy. Is there is diffuse
haziness of the retroperitoneal and periaortic fat planes.

There is inflammatory changes of the left psoas and iliacus muscles.
there has been interval removal of the left pelvic wall pigtail
drainage catheter. A 1.3 x 1.0 cm hypodense focus noted in the left
iliacus muscle (series 201, image [DATE] represent residual fluid.
There is overall interval improvement of the inflammatory changes of
the left pelvic sidewall. No drainable collection/abscess
identified.

There is irregularity of the left sacroiliac joint compatible with
sacroiliitis which is new compared to the study dated 05/22/2015 and
progressed since the study 06/02/2015. There is degenerative changes
of the spine. No acute fracture. Mild diffuse subcutaneous soft
tissue stranding noted.
IMPRESSION: Interval abdominal of the left pelvic side hole drainage catheter
with overall improvement of the inflammatory changes of the left
psoas and iliacus muscles. Small stable hypodense focus in the
superior aspect of the left iliacus muscle likely represents
residual fluid. No drainable fluid collection/ abscess identified.

Irregularity of the left SI joint, with interval progression
compared the prior study concerning for sacroiliitis. Clinical
correlation is recommended.

## 2018-06-04 ENCOUNTER — Other Ambulatory Visit: Payer: Self-pay

## 2018-06-04 ENCOUNTER — Emergency Department (HOSPITAL_COMMUNITY): Payer: Medicaid Other

## 2018-06-04 ENCOUNTER — Encounter (HOSPITAL_COMMUNITY): Payer: Self-pay | Admitting: Emergency Medicine

## 2018-06-04 ENCOUNTER — Emergency Department (HOSPITAL_COMMUNITY)
Admission: EM | Admit: 2018-06-04 | Discharge: 2018-06-04 | Disposition: A | Discharge status: Home / Self Care | Payer: Medicaid Other | Attending: Emergency Medicine | Admitting: Emergency Medicine

## 2018-06-04 DIAGNOSIS — W228XXA Striking against or struck by other objects, initial encounter: Secondary | ICD-10-CM | POA: Insufficient documentation

## 2018-06-04 DIAGNOSIS — F17210 Nicotine dependence, cigarettes, uncomplicated: Secondary | ICD-10-CM | POA: Diagnosis not present

## 2018-06-04 DIAGNOSIS — S40011A Contusion of right shoulder, initial encounter: Secondary | ICD-10-CM

## 2018-06-04 DIAGNOSIS — S20211A Contusion of right front wall of thorax, initial encounter: Secondary | ICD-10-CM

## 2018-06-04 DIAGNOSIS — Y929 Unspecified place or not applicable: Secondary | ICD-10-CM | POA: Diagnosis not present

## 2018-06-04 DIAGNOSIS — Z79899 Other long term (current) drug therapy: Secondary | ICD-10-CM | POA: Diagnosis not present

## 2018-06-04 DIAGNOSIS — Y999 Unspecified external cause status: Secondary | ICD-10-CM | POA: Diagnosis not present

## 2018-06-04 DIAGNOSIS — S299XXA Unspecified injury of thorax, initial encounter: Secondary | ICD-10-CM | POA: Diagnosis present

## 2018-06-04 DIAGNOSIS — Y9389 Activity, other specified: Secondary | ICD-10-CM | POA: Diagnosis not present

## 2018-06-04 MED ORDER — NAPROXEN 500 MG PO TABS: 500.0000 mg | ORAL_TABLET | Freq: Two times a day (BID) | ORAL | 0 refills | Status: DC
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
## 2018-06-04 NOTE — ED Notes (Signed)
Call to respiratory  They will come and teach incentive spir

## 2018-06-04 NOTE — Discharge Instructions (Signed)
Your x-ray shows a possible third rib fracture, it is not obvious, I cannot see it but the radiologist thought there may be one.  Thankfully this should get better over the next month.  Use the breathing tool that we have given you every hour to help take deep breaths.  Naprosyn twice a day for pain.  Ice packs and rest.  Emergency department for severe or worsening pain or difficulty breathing

## 2019-01-04 ENCOUNTER — Encounter (HOSPITAL_COMMUNITY): Payer: Self-pay | Admitting: *Deleted

## 2019-01-04 ENCOUNTER — Emergency Department (HOSPITAL_COMMUNITY): Payer: Medicaid Other

## 2019-01-04 ENCOUNTER — Other Ambulatory Visit: Payer: Self-pay

## 2019-01-04 ENCOUNTER — Emergency Department (HOSPITAL_COMMUNITY)
Admission: EM | Admit: 2019-01-04 | Discharge: 2019-01-04 | Disposition: A | Discharge status: Home / Self Care | Payer: Medicaid Other | Attending: Emergency Medicine | Admitting: Emergency Medicine

## 2019-01-04 DIAGNOSIS — F17210 Nicotine dependence, cigarettes, uncomplicated: Secondary | ICD-10-CM | POA: Insufficient documentation

## 2019-01-04 DIAGNOSIS — J111 Influenza due to unidentified influenza virus with other respiratory manifestations: Secondary | ICD-10-CM | POA: Diagnosis not present

## 2019-01-04 DIAGNOSIS — R509 Fever, unspecified: Secondary | ICD-10-CM | POA: Diagnosis present

## 2019-01-04 LAB — INFLUENZA PANEL BY PCR (TYPE A & B)
INFLAPCR: POSITIVE — AB
Influenza B By PCR: NEGATIVE

## 2019-01-04 MED ORDER — KETOROLAC TROMETHAMINE 30 MG/ML IJ SOLN
30.0000 mg | Freq: Once | INTRAMUSCULAR | Status: AC
  Administered 2019-01-04: 30 mg via INTRAVENOUS
  Filled 2019-01-04: qty 1

## 2019-01-04 MED ORDER — SODIUM CHLORIDE 0.9 % IV BOLUS
1000.0000 mL | Freq: Once | INTRAVENOUS | Status: AC
  Administered 2019-01-04: 1000 mL via INTRAVENOUS

## 2019-01-04 MED ORDER — ONDANSETRON 4 MG PO TBDP: ORAL_TABLET | ORAL | 0 refills | Status: AC

## 2019-01-04 MED ORDER — NAPROXEN 500 MG PO TABS: ORAL_TABLET | ORAL | 0 refills | Status: AC

## 2019-01-04 MED ORDER — ONDANSETRON HCL 4 MG/2ML IJ SOLN
4.0000 mg | Freq: Once | INTRAMUSCULAR | Status: AC
  Administered 2019-01-04: 4 mg via INTRAVENOUS
  Filled 2019-01-04: qty 2

## 2019-01-04 NOTE — Discharge Instructions (Addendum)
Drink plenty of fluids and follow-up with your doctor next week if not improving. 

## 2019-01-04 NOTE — ED Provider Notes (Signed)
[facility] Provider [facility]   CSN: [id number] Arrival date & time: 01/04/19  2029     History   Chief Complaint Chief Complaint  Patient presents with  . Fever    HPI [name] is a 43 y.o. male.  Patient complains of fevers aches cough for a week some vomiting  The history is provided by the patient. No language interpreter was used.  Cough  Cough characteristics:  Dry Sputum characteristics:  Nondescript Severity:  Moderate Onset quality:  Sudden Timing:  Constant Progression:  Worsening Chronicity:  New Smoker: no   Context: not exposure to allergens   Relieved by:  Nothing Worsened by:  Nothing Ineffective treatments:  None tried Associated symptoms: fever   Associated symptoms: no chest pain, no eye discharge, no headaches and no rash     Past Medical History:  Diagnosis Date  . Candida parapsilosis infection 07/21/2015  . H/O back injury   . 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 IV drug user 07/21/2015  . MRSA (methicillin resistant Staphylococcus aureus)   . MRSA bacteremia 04/2015  . MRSA bacteremia 07/21/2015  . Sepsis due to Acinetobacter [facility](HCC) 07/21/2015    Patient Active Problem List   Diagnosis Date Noted  . MRSA bacteremia 07/21/2015  . Sepsis due to Acinetobacter (HCC) 07/21/2015  . Candida parapsilosis infection 07/21/2015  . IV drug user 07/21/2015  . Thrombocytopenia (HCC) 07/13/2015  . Sepsis (HCC) 07/11/2015  . Bacteremia 06/25/2015  . Candidemia (HCC)   . Abscess   . Anemia of chronic disease 06/11/2015  . Acute kidney injury (HCC) 06/11/2015  . Intractable pain 06/11/2015  . Hematuria 06/11/2015  . H/O back injury   . Psoas abscess (HCC)   . Iliopsoas abscess (HCC) 05/15/2015    Past Surgical History:  Procedure Laterality Date  . abdmoninal drain insertion    . SKIN BIOPSY    . TEE WITHOUT CARDIOVERSION N/A 06/13/2015   Procedure: TRANSESOPHAGEAL ECHOCARDIOGRAM (TEE);  Surgeon: Wendall StadePeter C Nishan, MD;   Location: [facility];  Service: Cardiovascular;  Laterality: N/A;        Home Medications    Prior to Admission medications   Medication Sig Start Date End Date Taking? Authorizing Provider  LATUDA 20 MG TABS tablet Take 20 mg by mouth Walker. 05/19/18   [provider]  naproxen (NAPROSYN) 500 MG tablet Take 1 pill every 12 hours as needed for fevers and aches 01/04/19   Bethann BerkshireZammit, Ardys Hataway, MD  ondansetron (ZOFRAN ODT) 4 MG disintegrating tablet 4mg  ODT q4 hours prn nausea/vomit 01/04/19   Bethann BerkshireZammit, Olufemi Mofield, MD  SUBOXONE 8-2 MG FILM Place 1 Film under the tongue 2 (two) times Walker. 05/30/18   [provider]    Family History Family History  Problem Relation Age of Onset  . Cancer Father     Social History Social History   Tobacco Use  . Smoking status: Current Every Day Smoker    Packs/day: 1.50    Years: 20.00    Pack years: 30.00    Types: Cigarettes  . Smokeless tobacco: Never Used  Substance Use Topics  . Alcohol use: No  . Drug use: No    Comment: quit marijuana- 3-4 months ago     Allergies   Vicodin [hydrocodone-acetaminophen] and Adhesive [tape]   Review of Systems Review of Systems  Constitutional: Positive for fever. Negative for appetite change and fatigue.  HENT: Negative for congestion, ear discharge and sinus pressure.   Eyes: Negative  for discharge.  Respiratory: Positive for cough.   Cardiovascular: Negative for chest pain.  Gastrointestinal: Negative for abdominal pain and diarrhea.  Genitourinary: Negative for frequency and hematuria.  Musculoskeletal: Negative for back pain.  Skin: Negative for rash.  Neurological: Negative for seizures and headaches.  Psychiatric/Behavioral: Negative for hallucinations.     Physical Exam Updated Vital Signs BP 123/73 (BP Location: Right Arm)   Pulse (!) 102   Temp 100 F (37.8 C) (Oral)   Resp 18   Ht 6' (1.829 m)   Wt 90.7 kg   SpO2 96%   BMI 27.12 kg/m   Physical Exam Vitals signs and  nursing note reviewed.  Constitutional:      Appearance: He is well-developed.  HENT:     Head: Normocephalic.     Nose: Nose normal.  Eyes:     General: No scleral icterus.    Conjunctiva/sclera: Conjunctivae normal.  Neck:     Musculoskeletal: Neck supple.     Thyroid: No thyromegaly.  Cardiovascular:     Rate and Rhythm: Normal rate and regular rhythm.     Heart sounds: No murmur. No friction rub. No gallop.   Pulmonary:     Breath sounds: No stridor. No wheezing or rales.  Chest:     Chest wall: No tenderness.  Abdominal:     General: There is no distension.     Tenderness: There is no abdominal tenderness. There is no rebound.  Musculoskeletal: Normal range of motion.  Lymphadenopathy:     Cervical: No cervical adenopathy.  Skin:    Findings: No erythema or rash.  Neurological:     Mental Status: He is oriented to person, place, and time.     Motor: No abnormal muscle tone.     Coordination: Coordination normal.  Psychiatric:        Behavior: Behavior normal.      ED Treatments / Results  Labs (all labs ordered are listed, but only abnormal results are displayed) Labs Reviewed  INFLUENZA PANEL BY PCR (TYPE A & B) - Abnormal; Notable for the following components:      Result Value   Influenza A By PCR POSITIVE (*)    All other components within normal limits  CBC WITH DIFFERENTIAL/PLATELET  COMPREHENSIVE METABOLIC PANEL    EKG None  Radiology Dg Chest 2 View  Result Date: 01/04/2019 CLINICAL DATA:  Productive cough, fever. EXAM: CHEST - 2 VIEW COMPARISON:  Radiographs of June 04, 2018. FINDINGS: The heart size and mediastinal contours are within normal limits. Both lungs are clear. The visualized skeletal structures are unremarkable. IMPRESSION: No active cardiopulmonary disease. Electronically Signed   By: Lupita RaiderJames  Green Jr, M.D.   On: 01/04/2019 20:58    Procedures Procedures (including critical care time)  Medications Ordered in ED Medications    sodium chloride 0.9 % bolus 1,000 mL (1,000 mLs Intravenous New Bag/Given 01/04/19 2208)  ketorolac (TORADOL) 30 MG/ML injection 30 mg (30 mg Intravenous Given 01/04/19 2208)  ondansetron (ZOFRAN) injection 4 mg (4 mg Intravenous Given 01/04/19 2207)     Initial Impression / Assessment and Plan / ED Course  I have reviewed the triage vital signs and the nursing notes.  Pertinent labs & imaging results that were available during my care of the patient were reviewed by me and considered in my medical decision making (see chart for details).     Chest x-ray negative.  Influenza positive.  Patient did not want to wait around for his lab  work to finish.  Diagnosis influenza.  He is given a nonsteroidal and Zofran  Final Clinical Impressions(s) / ED Diagnoses   Final diagnoses:  Influenza    ED Discharge Orders         Ordered    naproxen (NAPROSYN) 500 MG tablet     01/04/19 2223    ondansetron (ZOFRAN ODT) 4 MG disintegrating tablet     01/04/19 2223           Bethann Berkshire, MD 01/04/19 2226
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
## 2019-01-04 NOTE — ED Triage Notes (Signed)
Pt c/o n/v/d, cough, fever, chills for the past 4 days,

## 2019-11-19 ENCOUNTER — Other Ambulatory Visit: Payer: Self-pay

## 2019-11-19 ENCOUNTER — Encounter (HOSPITAL_BASED_OUTPATIENT_CLINIC_OR_DEPARTMENT_OTHER): Payer: Self-pay | Admitting: Emergency Medicine

## 2019-11-19 ENCOUNTER — Emergency Department (HOSPITAL_BASED_OUTPATIENT_CLINIC_OR_DEPARTMENT_OTHER)
Admission: EM | Admit: 2019-11-19 | Discharge: 2019-11-19 | Disposition: A | Discharge status: Home / Self Care | Payer: Medicaid Other | Attending: Emergency Medicine | Admitting: Emergency Medicine

## 2019-11-19 DIAGNOSIS — Z79899 Other long term (current) drug therapy: Secondary | ICD-10-CM | POA: Diagnosis not present

## 2019-11-19 DIAGNOSIS — F17210 Nicotine dependence, cigarettes, uncomplicated: Secondary | ICD-10-CM | POA: Diagnosis not present

## 2019-11-19 DIAGNOSIS — H5712 Ocular pain, left eye: Secondary | ICD-10-CM | POA: Diagnosis present

## 2019-11-19 DIAGNOSIS — H01004 Unspecified blepharitis left upper eyelid: Secondary | ICD-10-CM

## 2019-11-19 DIAGNOSIS — N189 Chronic kidney disease, unspecified: Secondary | ICD-10-CM | POA: Diagnosis not present

## 2019-11-19 MED ORDER — TETRACAINE HCL 0.5 % OP SOLN
OPHTHALMIC | Status: AC
  Filled 2019-11-19: qty 4

## 2019-11-19 MED ORDER — FLUORESCEIN SODIUM 1 MG OP STRP
ORAL_STRIP | OPHTHALMIC | Status: AC
  Filled 2019-11-19: qty 1

## 2019-11-19 MED ORDER — ERYTHROMYCIN 5 MG/GM OP OINT
TOPICAL_OINTMENT | Freq: Three times a day (TID) | OPHTHALMIC | Status: DC
  Administered 2019-11-19: 1 via OPHTHALMIC
  Filled 2019-11-19: qty 3.5

## 2019-11-19 MED ORDER — TETRACAINE HCL 0.5 % OP SOLN
1.0000 [drp] | Freq: Once | OPHTHALMIC | Status: AC
  Administered 2019-11-19: 1 [drp] via OPHTHALMIC

## 2019-11-19 MED ORDER — FLUORESCEIN SODIUM 1 MG OP STRP
1.0000 | ORAL_STRIP | Freq: Once | OPHTHALMIC | Status: AC
  Administered 2019-11-19: 03:00:00 1 via OPHTHALMIC

## 2019-11-19 NOTE — ED Triage Notes (Signed)
  Patient comes in with L eye pain that started earlier yesterday.  Patient states he has had clear drainage from eye and when he wakes up the eye is matted shut.  Patient states it feels like something is in his eye.  No redness noted on sclera.  No drainage evident at this time.  Patient says it feels like a stye.

## 2019-11-19 NOTE — ED Provider Notes (Signed)
[facility] EMERGENCY DEPARTMENT Provider Note   CSN: [id number] Arrival date & time: 11/19/19  0229     History Chief Complaint  Patient presents with  . Eye Pain  . Eye Drainage    Adrian SCHARA is a 43 y.o. male.  The history is provided by the patient.  Eye Pain This is a new problem. The current episode started 12 to 24 hours ago. The problem occurs constantly. The problem has been gradually worsening. Pertinent negatives include no headaches. Nothing aggravates the symptoms. Nothing relieves the symptoms.  Patient reports left eyelid swelling and mild blurred vision.  He has also had some clear drainage from his left eye.  He reports he was cutting metal several days ago and may have injured his eye, but did not have any issues until the past 24 hours.  He does not wear contacts or glasses.  No fevers or headache.  No other acute complaints.     Past Medical History:  Diagnosis Date  . Candida parapsilosis infection 07/21/2015  . H/O back injury   . Iliopsoas abscess ([place]) 04/2015   left  . IV drug user 07/21/2015  . MRSA (methicillin resistant Staphylococcus aureus)   . MRSA bacteremia 04/2015  . MRSA bacteremia 07/21/2015  . Sepsis due to Acinetobacter [facility]) 07/21/2015    Patient Active Problem List   Diagnosis Date Noted  . MRSA bacteremia 07/21/2015  . Sepsis due to Acinetobacter ([place]) 07/21/2015  . Candida parapsilosis infection 07/21/2015  . IV drug user 07/21/2015  . Thrombocytopenia ([place]) 07/13/2015  . Sepsis ([place]) 07/11/2015  . Bacteremia 06/25/2015  . Candidemia ([place])   . Abscess   . Anemia of chronic disease 06/11/2015  . Acute kidney injury ([place]) 06/11/2015  . Intractable pain 06/11/2015  . Hematuria 06/11/2015  . H/O back injury   . Psoas abscess ([place])   . Iliopsoas abscess ([place]) 05/15/2015    Past Surgical History:  Procedure Laterality Date  . abdmoninal drain insertion    . SKIN BIOPSY    . TEE WITHOUT CARDIOVERSION N/A 06/13/2015    Procedure: TRANSESOPHAGEAL ECHOCARDIOGRAM (TEE);  Surgeon: Josue Hector, MD;  Location: [facility];  Service: Cardiovascular;  Laterality: N/A;       Family History  Problem Relation Age of Onset  . Cancer Father     Social History   Tobacco Use  . Smoking status: Current Every Day Smoker    Packs/day: 1.50    Years: 20.00    Pack years: 30.00    Types: Cigarettes  . Smokeless tobacco: Never Used  Substance Use Topics  . Alcohol use: No  . Drug use: No    Comment: quit marijuana- 3-4 months ago    Home Medications Prior to Admission medications   Medication Sig Start Date End Date Taking? Authorizing Provider  LATUDA 20 MG TABS tablet Take 20 mg by mouth daily. 05/19/18   [provider]  naproxen (NAPROSYN) 500 MG tablet Take 1 pill every 12 hours as needed for fevers and aches 01/04/19   Milton Ferguson, MD  ondansetron (ZOFRAN ODT) 4 MG disintegrating tablet 4mg  ODT q4 hours prn nausea/vomit 01/04/19   Milton Ferguson, MD  SUBOXONE 8-2 MG FILM Place 1 Film under the tongue 2 (two) times daily. 05/30/18   [provider]    Allergies    Vicodin [hydrocodone-acetaminophen] and Adhesive [tape]  Review of Systems   Review of Systems  Constitutional: Negative for fever.  Eyes: Positive for pain  and discharge.  Neurological: Negative for headaches.    Physical Exam Updated Vital Signs BP (!) 141/83 (BP Location: Right Arm)   Pulse 89   Temp 98.2 F (36.8 C) (Oral)   Resp 20   Ht 1.829 m (6')   Wt 90.7 kg   SpO2 98%   BMI 27.12 kg/m   Physical Exam CONSTITUTIONAL: Well developed/well nourished HEAD: Normocephalic/atraumatic EYES: EOMI/PERRL, os-no corneal abrasion, no foreign bodies, no consensual pain, no corneal hazing, no proptosis.  No conjunctival erythema.  There is mild edema erythema to the left upper eyelid.  No rash noted to face.  Visual acuity noted ENMT: Mucous membranes moist NECK: supple no meningeal signs LUNGS:  no apparent  distress NEURO: Pt is awake/alert/appropriate, moves all extremitiesx4.  No facial droop.   EXTREMITIES:  full ROM SKIN: warm, color normal PSYCH: no abnormalities of mood noted, alert and oriented to situation  ED Results / Procedures / Treatments   Labs (all labs ordered are listed, but only abnormal results are displayed) Labs Reviewed - No data to display  EKG None  Radiology No results found.  Procedures Procedures    Medications Ordered in ED Medications  erythromycin ophthalmic ointment (1 application Left Eye Given 11/19/19 0303)  fluorescein 1 MG ophthalmic strip (has no administration in time range)  tetracaine (PONTOCAINE) 0.5 % ophthalmic solution (has no administration in time range)  tetracaine (PONTOCAINE) 0.5 % ophthalmic solution 1 drop (1 drop Left Eye Given 11/19/19 0305)  fluorescein ophthalmic strip 1 strip (1 strip Left Eye Given 11/19/19 0305)    ED Course  I have reviewed the triage vital signs and the nursing notes.       MDM Rules/Calculators/A&P                      Patient well-appearing.  No signs of abrasion or acute eye injury.  Issues seem to be related to blepharitis of the left upper eyelid. Will start erythromycin and refer to ophthalmology Final Clinical Impression(s) / ED Diagnoses Final diagnoses:  Blepharitis of left upper eyelid, unspecified type    Rx / DC Orders ED Discharge Orders    None       Zadie Rhine, MD 11/19/19 [phone]

## 2020-07-12 ENCOUNTER — Emergency Department (HOSPITAL_BASED_OUTPATIENT_CLINIC_OR_DEPARTMENT_OTHER)
Admission: EM | Admit: 2020-07-12 | Discharge: 2020-07-12 | Disposition: A | Discharge status: Home / Self Care | Payer: Medicaid Other

## 2020-07-12 ENCOUNTER — Other Ambulatory Visit: Payer: Self-pay

## 2020-07-14 ENCOUNTER — Encounter (HOSPITAL_BASED_OUTPATIENT_CLINIC_OR_DEPARTMENT_OTHER): Payer: Self-pay | Admitting: *Deleted

## 2020-07-14 ENCOUNTER — Emergency Department (HOSPITAL_BASED_OUTPATIENT_CLINIC_OR_DEPARTMENT_OTHER)
Admission: EM | Admit: 2020-07-14 | Discharge: 2020-07-15 | Disposition: A | Discharge status: Home / Self Care | Payer: Medicaid Other | Attending: Emergency Medicine | Admitting: Emergency Medicine

## 2020-07-14 ENCOUNTER — Other Ambulatory Visit: Payer: Self-pay

## 2020-07-14 DIAGNOSIS — R0981 Nasal congestion: Secondary | ICD-10-CM | POA: Diagnosis present

## 2020-07-14 DIAGNOSIS — F17210 Nicotine dependence, cigarettes, uncomplicated: Secondary | ICD-10-CM | POA: Diagnosis not present

## 2020-07-14 DIAGNOSIS — U071 COVID-19: Secondary | ICD-10-CM | POA: Insufficient documentation

## 2020-07-14 LAB — SARS CORONAVIRUS 2 BY RT PCR (HOSPITAL ORDER, PERFORMED IN ~~LOC~~ HOSPITAL LAB): SARS Coronavirus 2: POSITIVE — AB

## 2020-07-14 NOTE — ED Provider Notes (Signed)
[facility] EMERGENCY DEPARTMENT Provider Note   CSN: [id number] Arrival date & time: 07/14/20  1957     History Chief Complaint  Patient presents with  . Covid Exposure    [name] is a 44 y.o. male.  Patient is a 44 year old male with past medical history of IV drug use with bacteremia.  He presents today for evaluation of nasal congestion and cough.  Patient was exposed to a family member with Covid.  Denies fevers or chills.  He denies chest pain or difficulty breathing.  He is requesting a Covid test.  He tells me he tested positive for Covid last year and was moderately ill but did not require hospitalization.  Today's symptoms he describes as mild.  The history is provided by the patient.       Past Medical History:  Diagnosis Date  . Candida parapsilosis infection 07/21/2015  . H/O back injury   . 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 IV drug user 07/21/2015  . MRSA (methicillin resistant Staphylococcus aureus)   . MRSA bacteremia 04/2015  . MRSA bacteremia 07/21/2015  . Sepsis due to Acinetobacter [facility]) 07/21/2015    Patient Active Problem List   Diagnosis Date Noted  . MRSA bacteremia 07/21/2015  . Sepsis due to Acinetobacter (HCC) 07/21/2015  . Candida parapsilosis infection 07/21/2015  . IV drug user 07/21/2015  . Thrombocytopenia (HCC) 07/13/2015  . Sepsis (HCC) 07/11/2015  . Bacteremia 06/25/2015  . Candidemia (HCC)   . Abscess   . Anemia of chronic disease 06/11/2015  . Acute kidney injury (HCC) 06/11/2015  . Intractable pain 06/11/2015  . Hematuria 06/11/2015  . H/O back injury   . Psoas abscess (HCC)   . Iliopsoas abscess (HCC) 05/15/2015    Past Surgical History:  Procedure Laterality Date  . abdmoninal drain insertion    . SKIN BIOPSY    . TEE WITHOUT CARDIOVERSION N/A 06/13/2015   Procedure: TRANSESOPHAGEAL ECHOCARDIOGRAM (TEE);  Surgeon: Wendall Stade, MD;  Location: [facility];  Service: Cardiovascular;  Laterality: N/A;        Family History  Problem Relation Age of Onset  . Cancer Father     Social History   Tobacco Use  . Smoking status: Current Every Day Smoker    Packs/day: 1.50    Years: 20.00    Pack years: 30.00    Types: Cigarettes  . Smokeless tobacco: Never Used  Substance Use Topics  . Alcohol use: No  . Drug use: No    Comment: quit marijuana- 3-4 months ago    Home Medications Prior to Admission medications   Medication Sig Start Date End Date Taking? Authorizing Provider  LATUDA 20 MG TABS tablet Take 20 mg by mouth daily. 05/19/18   [provider]  naproxen (NAPROSYN) 500 MG tablet Take 1 pill every 12 hours as needed for fevers and aches 01/04/19   Bethann Berkshire, MD  ondansetron (ZOFRAN ODT) 4 MG disintegrating tablet 4mg  ODT q4 hours prn nausea/vomit 01/04/19   01/06/19, MD  SUBOXONE 8-2 MG FILM Place 1 Film under the tongue 2 (two) times daily. 05/30/18   [provider]    Allergies    Vicodin [hydrocodone-acetaminophen] and Adhesive [tape]  Review of Systems   Review of Systems  All other systems reviewed and are negative.   Physical Exam Updated Vital Signs BP (!) 132/92   Pulse 96   Temp 98.8 F (37.1 C) (Oral)   Resp 18  Ht 6' (1.829 m)   Wt 90.7 kg   SpO2 97%   BMI 27.12 kg/m   Physical Exam Vitals and nursing note reviewed.  Constitutional:      General: He is not in acute distress.    Appearance: He is well-developed. He is not diaphoretic.  HENT:     Head: Normocephalic and atraumatic.  Cardiovascular:     Rate and Rhythm: Normal rate and regular rhythm.     Heart sounds: No murmur heard.  No friction rub.  Pulmonary:     Effort: Pulmonary effort is normal. No respiratory distress.     Breath sounds: Normal breath sounds. No wheezing or rales.  Abdominal:     General: Bowel sounds are normal. There is no distension.     Palpations: Abdomen is soft.     Tenderness: There is no abdominal tenderness.   Musculoskeletal:        General: Normal range of motion.     Cervical back: Normal range of motion and neck supple.  Skin:    General: Skin is warm and dry.  Neurological:     Mental Status: He is alert and oriented to person, place, and time.     Coordination: Coordination normal.     ED Results / Procedures / Treatments   Labs (all labs ordered are listed, but only abnormal results are displayed) Labs Reviewed  SARS CORONAVIRUS 2 BY RT PCR (HOSPITAL ORDER, PERFORMED IN [facility]) - Abnormal; Notable for the following components:      Result Value   SARS Coronavirus 2 POSITIVE (*)    All other components within normal limits    EKG None  Radiology No results found.  Procedures Procedures (including critical care time)  Medications Ordered in ED Medications - No data to display  ED Course  I have reviewed the triage vital signs and the nursing notes.  Pertinent labs & imaging results that were available during my care of the patient were reviewed by me and considered in my medical decision making (see chart for details).    MDM Rules/Calculators/A&P  Patient advised of positive Covid test.  His vital signs are stable with no hypoxia.  He is in no distress.  Patient to be discharged with symptomatic care, home isolation, and return as needed for difficulty breathing.  [name] was evaluated in Emergency Department on 07/14/2020 for the symptoms described in the history of present illness. He was evaluated in the context of the global COVID-19 pandemic, which necessitated consideration that the patient might be at risk for infection with the SARS-CoV-2 virus that causes COVID-19. Institutional protocols and algorithms that pertain to the evaluation of patients at risk for COVID-19 are in a state of rapid change based on information released by regulatory bodies including the CDC and federal and state organizations. These policies and algorithms were  followed during the patient's care in the ED.   Final Clinical Impression(s) / ED Diagnoses Final diagnoses:  None    Rx / DC Orders ED Discharge Orders    None       Geoffery Lyons, MD 07/14/20 [phone]

## 2020-07-14 NOTE — Discharge Instructions (Signed)
Take Tylenol 1000 mg every 6 hours as needed for fever.  Drink plenty of fluids and get plenty of rest.  Isolate at home until you feel better +1-week.  Return to the emergency department if you develop difficulty breathing, severe chest pains, or other new and concerning symptoms.     Person Under Monitoring Name: [name]  Location: [address] [place] [address]   Infection Prevention Recommendations for Individuals Confirmed to have, or Being Evaluated for, 2019 Novel Coronavirus (COVID-19) Infection Who Receive Care at Home  Individuals who are confirmed to have, or are being evaluated for, COVID-19 should follow the prevention steps below until a healthcare provider or local or state health department says they can return to normal activities.  Stay home except to get medical care You should restrict activities outside your home, except for getting medical care. Do not go to work, school, or public areas, and do not use public transportation or taxis.  Call ahead before visiting your doctor Before your medical appointment, call the healthcare provider and tell them that you have, or are being evaluated for, COVID-19 infection. This will help the healthcare provider's office take steps to keep other people from getting infected. Ask your healthcare provider to call the local or state health department.  Monitor your symptoms Seek prompt medical attention if your illness is worsening (e.g., difficulty breathing). Before going to your medical appointment, call the healthcare provider and tell them that you have, or are being evaluated for, COVID-19 infection. Ask your healthcare provider to call the local or state health department.  Wear a facemask You should wear a facemask that covers your nose and mouth when you are in the same room with other people and when you visit a healthcare provider. People who live with or visit you should also wear a facemask while  they are in the same room with you.  Separate yourself from other people in your home As much as possible, you should stay in a different room from other people in your home. Also, you should use a separate bathroom, if available.  Avoid sharing household items You should not share dishes, drinking glasses, cups, eating utensils, towels, bedding, or other items with other people in your home. After using these items, you should wash them thoroughly with soap and water.  Cover your coughs and sneezes Cover your mouth and nose with a tissue when you cough or sneeze, or you can cough or sneeze into your sleeve. Throw used tissues in a lined trash can, and immediately wash your hands with soap and water for at least 20 seconds or use an alcohol-based hand rub.  Wash your [employer] your hands often and thoroughly with soap and water for at least 20 seconds. You can use an alcohol-based hand sanitizer if soap and water are not available and if your hands are not visibly dirty. Avoid touching your eyes, nose, and mouth with unwashed hands.   Prevention Steps for Caregivers and Household Members of Individuals Confirmed to have, or Being Evaluated for, COVID-19 Infection Being Cared for in the Home  If you live with, or provide care at home for, a person confirmed to have, or being evaluated for, COVID-19 infection please follow these guidelines to prevent infection:  Follow healthcare provider's instructions Make sure that you understand and can help the patient follow any healthcare provider instructions for all care.  Provide for the patient's basic needs You should help the patient with basic  needs in the home and provide support for getting groceries, prescriptions, and other personal needs.  Monitor the patient's symptoms If they are getting sicker, call his or her medical provider and tell them that the patient has, or is being evaluated for, COVID-19 infection. This will help  the healthcare provider's office take steps to keep other people from getting infected. Ask the healthcare provider to call the local or state health department.  Limit the number of people who have contact with the patient If possible, have only one caregiver for the patient. Other household members should stay in another home or place of residence. If this is not possible, they should stay in another room, or be separated from the patient as much as possible. Use a separate bathroom, if available. Restrict visitors who do not have an essential need to be in the home.  Keep older adults, very young children, and other sick people away from the patient Keep older adults, very young children, and those who have compromised immune systems or chronic health conditions away from the patient. This includes people with chronic heart, lung, or kidney conditions, diabetes, and cancer.  Ensure good ventilation Make sure that shared spaces in the home have good air flow, such as from an air conditioner or an opened window, weather permitting.  Wash your hands often Wash your hands often and thoroughly with soap and water for at least 20 seconds. You can use an alcohol based hand sanitizer if soap and water are not available and if your hands are not visibly dirty. Avoid touching your eyes, nose, and mouth with unwashed hands. Use disposable paper towels to dry your hands. If not available, use dedicated cloth towels and replace them when they become wet.  Wear a facemask and gloves Wear a disposable facemask at all times in the room and gloves when you touch or have contact with the patient's blood, body fluids, and/or secretions or excretions, such as sweat, saliva, sputum, nasal mucus, vomit, urine, or feces.  Ensure the mask fits over your nose and mouth tightly, and do not touch it during use. Throw out disposable facemasks and gloves after using them. Do not reuse. Wash your hands immediately  after removing your facemask and gloves. If your personal clothing becomes contaminated, carefully remove clothing and launder. Wash your hands after handling contaminated clothing. Place all used disposable facemasks, gloves, and other waste in a lined container before disposing them with other household waste. Remove gloves and wash your hands immediately after handling these items.  Do not share dishes, glasses, or other household items with the patient Avoid sharing household items. You should not share dishes, drinking glasses, cups, eating utensils, towels, bedding, or other items with a patient who is confirmed to have, or being evaluated for, COVID-19 infection. After the person uses these items, you should wash them thoroughly with soap and water.  Wash laundry thoroughly Immediately remove and wash clothes or bedding that have blood, body fluids, and/or secretions or excretions, such as sweat, saliva, sputum, nasal mucus, vomit, urine, or feces, on them. Wear gloves when handling laundry from the patient. Read and follow directions on labels of laundry or clothing items and detergent. In general, wash and dry with the warmest temperatures recommended on the label.  Clean all areas the individual has used often Clean all touchable surfaces, such as counters, tabletops, doorknobs, bathroom fixtures, toilets, phones, keyboards, tablets, and bedside tables, every day. Also, clean any surfaces that may have blood,  body fluids, and/or secretions or excretions on them. Wear gloves when cleaning surfaces the patient has come in contact with. Use a diluted bleach solution (e.g., dilute bleach with 1 part bleach and 10 parts water) or a household disinfectant with a label that says EPA-registered for coronaviruses. To make a bleach solution at home, add 1 tablespoon of bleach to 1 quart (4 cups) of water. For a larger supply, add  cup of bleach to 1 gallon (16 cups) of water. Read labels of  cleaning products and follow recommendations provided on product labels. Labels contain instructions for safe and effective use of the cleaning product including precautions you should take when applying the product, such as wearing gloves or eye protection and making sure you have good ventilation during use of the product. Remove gloves and wash hands immediately after cleaning.  Monitor yourself for signs and symptoms of illness Caregivers and household members are considered close contacts, should monitor their health, and will be asked to limit movement outside of the home to the extent possible. Follow the monitoring steps for close contacts listed on the symptom monitoring form.   ? If you have additional questions, contact your local health department or call the epidemiologist on call at [phone] (available 24/7). ? This guidance is subject to change. For the most up-to-date guidance from [facility], please refer to their website: [email or web address]

## 2020-07-14 NOTE — ED Triage Notes (Signed)
Cough, runny nose today. He has had a Covid exposure. States he needs a Covid test to return to work.

## 2022-01-10 ENCOUNTER — Other Ambulatory Visit: Payer: Self-pay

## 2022-01-10 ENCOUNTER — Encounter (HOSPITAL_BASED_OUTPATIENT_CLINIC_OR_DEPARTMENT_OTHER): Payer: Self-pay | Admitting: Emergency Medicine

## 2022-01-10 ENCOUNTER — Emergency Department (HOSPITAL_BASED_OUTPATIENT_CLINIC_OR_DEPARTMENT_OTHER)
Admission: EM | Admit: 2022-01-10 | Discharge: 2022-01-10 | Disposition: A | Discharge status: Home / Self Care | Payer: Medicaid Other | Attending: Emergency Medicine | Admitting: Emergency Medicine

## 2022-01-10 DIAGNOSIS — R200 Anesthesia of skin: Secondary | ICD-10-CM | POA: Diagnosis not present

## 2022-01-10 DIAGNOSIS — M545 Low back pain, unspecified: Secondary | ICD-10-CM | POA: Diagnosis present

## 2022-01-10 DIAGNOSIS — M5432 Sciatica, left side: Secondary | ICD-10-CM | POA: Diagnosis not present

## 2022-01-10 MED ORDER — PREDNISONE 10 MG PO TABS: 40.0000 mg | ORAL_TABLET | Freq: Every day | ORAL | 0 refills | Status: AC

## 2022-01-10 MED ORDER — PREDNISONE 50 MG PO TABS
60.0000 mg | ORAL_TABLET | Freq: Once | ORAL | Status: AC
  Administered 2022-01-10: 60 mg via ORAL
  Filled 2022-01-10: qty 1

## 2022-01-10 NOTE — Discharge Instructions (Signed)
Been as much time off your feet as possible.  Work note provided.  Take the prednisone as directed.  Make an appointment to follow-up with sports medicine.  Information provided above.

## 2022-01-10 NOTE — ED Provider Notes (Signed)
[place] EMERGENCY DEPT Provider Note   CSN: [id number] Arrival date & time: 01/10/22  1840     History  Chief Complaint  Patient presents with   Numbness    [name] is a 46 y.o. male.  Patient reports about 3 weeks ago was having a lot of left-sided low back pain.  And starting about a week and a half ago he started getting pain that radiating into his left leg.  Numbness to the top and bottom of his foot.  Denies any incontinence.  Denies any involvement in the right leg.  No fall or injury.  The lower part of the back pain has resolved.  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      Home Medications Prior to Admission medications   Medication Sig Start Date End Date Taking? Authorizing Provider  predniSONE (DELTASONE) 10 MG tablet Take 4 tablets (40 mg total) by mouth daily. 01/10/22  Yes Fredia Sorrow, MD  LATUDA 20 MG TABS tablet Take 20 mg by mouth daily. 05/19/18   [provider]  naproxen (NAPROSYN) 500 MG tablet Take 1 pill every 12 hours as needed for fevers and aches 01/04/19   Milton Ferguson, MD  ondansetron (ZOFRAN ODT) 4 MG disintegrating tablet 4mg  ODT q4 hours prn nausea/vomit 01/04/19   Milton Ferguson, MD  SUBOXONE 8-2 MG FILM Place 1 Film under the tongue 2 (two) times daily. 05/30/18   [provider]      Allergies    Vicodin [hydrocodone-acetaminophen] and Adhesive [tape]    Review of Systems   Review of Systems  Constitutional:  Negative for chills and fever.  HENT:  Negative for ear pain and sore throat.   Eyes:  Negative for pain and visual disturbance.  Respiratory:  Negative for cough and shortness of breath.   Cardiovascular:  Negative for chest pain and palpitations.  Gastrointestinal:  Negative for abdominal pain and vomiting.  Genitourinary:  Negative for dysuria and hematuria.  Musculoskeletal:  Positive for back pain. Negative for arthralgias.  Skin:  Negative for color change and  rash.  Neurological:  Positive for numbness. Negative for seizures and syncope.  All other systems reviewed and are negative.  Physical Exam Updated Vital Signs BP 132/90    Pulse 84    Temp 98.3 F (36.8 C) (Oral)    Resp 16    Ht 1.829 m (6')    Wt 113.4 kg    SpO2 94%    BMI 33.91 kg/m  Physical Exam Vitals and nursing note reviewed.  Constitutional:      General: He is not in acute distress.    Appearance: Normal appearance. He is well-developed.  HENT:     Head: Normocephalic and atraumatic.  Eyes:     Extraocular Movements: Extraocular movements intact.     Conjunctiva/sclera: Conjunctivae normal.     Pupils: Pupils are equal, round, and reactive to light.  Cardiovascular:     Rate and Rhythm: Normal rate and regular rhythm.     Heart sounds: No murmur heard. Pulmonary:     Effort: Pulmonary effort is normal. No respiratory distress.     Breath sounds: Normal breath sounds.  Abdominal:     Palpations: Abdomen is soft.     Tenderness: There is no abdominal tenderness.  Musculoskeletal:        General: No swelling.     Cervical back: Normal range of motion and neck supple.     Comments: Good  strength to the toes on the left foot both plantarflexion and dorsiflexion.  Dorsalis pedis pulses very strong 2+.  Patient states numbness to the top and bottom of the foot.  Right lower extremity without any weakness or numbness.  Skin:    General: Skin is warm and dry.     Capillary Refill: Capillary refill takes less than 2 seconds.  Neurological:     General: No focal deficit present.     Mental Status: He is alert and oriented to person, place, and time.     Cranial Nerves: No cranial nerve deficit.     Sensory: Sensory deficit present.     Motor: No weakness.  Psychiatric:        Mood and Affect: Mood normal.    ED Results / Procedures / Treatments   Labs (all labs ordered are listed, but only abnormal results are displayed) Labs Reviewed - No data to  display  EKG None  Radiology No results found.  Procedures Procedures    Medications Ordered in ED Medications  predniSONE (DELTASONE) tablet 60 mg (has no administration in time range)    ED Course/ Medical Decision Making/ A&P                           Medical Decision Making  Patient's symptoms consistent with left-sided sciatica.  We will go ahead and treat with prednisone 60 mg here and then a 5-day course of prednisone at home and given referral to sports medicine out at [facility].  And also give patient a work note.   Final Clinical Impression(s) / ED Diagnoses Final diagnoses:  Sciatica of left side    Rx / DC Orders ED Discharge Orders          Ordered    predniSONE (DELTASONE) 10 MG tablet  Daily        01/10/22 2200              Fredia Sorrow, MD 01/10/22 2201

## 2022-01-10 NOTE — ED Triage Notes (Signed)
Pt reports left leg numbness and pain. Reports he was having lower back pain and numbness 3 weeks ago that has resolved. Pt reports he is a recovering addict and requests no narcotics.

## 2022-01-27 DIAGNOSIS — F17210 Nicotine dependence, cigarettes, uncomplicated: Secondary | ICD-10-CM | POA: Insufficient documentation

## 2022-01-27 DIAGNOSIS — F1121 Opioid dependence, in remission: Secondary | ICD-10-CM | POA: Insufficient documentation

## 2022-01-29 ENCOUNTER — Ambulatory Visit: Payer: Medicaid Other | Admitting: Family Medicine

## 2022-02-26 ENCOUNTER — Institutional Professional Consult (permissible substitution): Payer: Medicaid Other | Admitting: Primary Care

## 2023-01-05 DIAGNOSIS — F1121 Opioid dependence, in remission: Secondary | ICD-10-CM | POA: Diagnosis not present

## 2023-01-21 DIAGNOSIS — Z3009 Encounter for other general counseling and advice on contraception: Secondary | ICD-10-CM | POA: Diagnosis not present

## 2023-03-08 ENCOUNTER — Encounter: Payer: Self-pay | Admitting: *Deleted

## 2023-07-23 ENCOUNTER — Emergency Department (HOSPITAL_BASED_OUTPATIENT_CLINIC_OR_DEPARTMENT_OTHER)
Admission: EM | Admit: 2023-07-23 | Discharge: 2023-07-23 | Disposition: A | Discharge status: Home / Self Care | Payer: Medicaid Other | Attending: Emergency Medicine | Admitting: Emergency Medicine

## 2023-07-23 ENCOUNTER — Other Ambulatory Visit: Payer: Self-pay

## 2023-07-23 ENCOUNTER — Encounter (HOSPITAL_BASED_OUTPATIENT_CLINIC_OR_DEPARTMENT_OTHER): Payer: Self-pay

## 2023-07-23 ENCOUNTER — Emergency Department (HOSPITAL_BASED_OUTPATIENT_CLINIC_OR_DEPARTMENT_OTHER): Payer: Medicaid Other

## 2023-07-23 DIAGNOSIS — W450XXA Nail entering through skin, initial encounter: Secondary | ICD-10-CM | POA: Diagnosis not present

## 2023-07-23 DIAGNOSIS — S91341A Puncture wound with foreign body, right foot, initial encounter: Secondary | ICD-10-CM | POA: Diagnosis present

## 2023-07-23 DIAGNOSIS — T148XXA Other injury of unspecified body region, initial encounter: Secondary | ICD-10-CM

## 2023-07-23 DIAGNOSIS — Z23 Encounter for immunization: Secondary | ICD-10-CM | POA: Insufficient documentation

## 2023-07-23 MED ORDER — CIPROFLOXACIN HCL 500 MG PO TABS
500.0000 mg | ORAL_TABLET | Freq: Once | ORAL | Status: AC
  Administered 2023-07-23: 500 mg via ORAL
  Filled 2023-07-23: qty 1

## 2023-07-23 MED ORDER — TETANUS-DIPHTH-ACELL PERTUSSIS 5-2.5-18.5 LF-MCG/0.5 IM SUSY
0.5000 mL | PREFILLED_SYRINGE | Freq: Once | INTRAMUSCULAR | Status: AC
  Administered 2023-07-23: 0.5 mL via INTRAMUSCULAR
  Filled 2023-07-23: qty 0.5

## 2023-07-23 MED ORDER — CIPROFLOXACIN HCL 500 MG PO TABS: 500.0000 mg | ORAL_TABLET | Freq: Two times a day (BID) | ORAL | 0 refills | Status: AC

## 2023-07-23 NOTE — Discharge Instructions (Signed)
Please read and follow all provided instructions.  Your diagnoses today include:  1. Puncture wound     Tests performed today include: Vital signs. See below for your results today.  X-ray of your foot: I do not see any signs of fracture or foreign bodies, however I will notify you if you have any concerning findings  Medications prescribed:  Ciprofloxacin - antibiotic  You have been prescribed an antibiotic medicine: take the entire course of medicine even if you are feeling better. Stopping early can cause the antibiotic not to work.  Take any prescribed medications only as directed.   Home care instructions:  Follow any educational materials contained in this packet. Keep affected area above the level of your heart when possible. Wash area gently twice a day with warm soapy water. Do not apply alcohol or hydrogen peroxide. Cover the area if it draining or weeping.   Follow-up instructions: Follow-up with primary care as needed.  Return instructions:  Return to the Emergency Department if you have: Fever Worsening symptoms Worsening pain Worsening swelling Redness of the skin that moves away from the affected area, especially if it streaks away from the affected area  Any other emergent concerns  Your vital signs today were: BP (!) 142/96 (BP Location: Left Arm)   Pulse 89   Temp 98.2 F (36.8 C) (Oral)   Resp 20   Ht 6' (1.829 m)   Wt 113.4 kg   SpO2 96%   BMI 33.91 kg/m  If your blood pressure (BP) was elevated above 135/85 this visit, please have this repeated by your doctor within one month. --------------

## 2023-07-23 NOTE — ED Provider Notes (Signed)
[place] EMERGENCY DEPARTMENT AT [facility] Provider Note   CSN: [id number] Arrival date & time: 07/23/23  2010     History  Chief Complaint  Patient presents with   Foot Injury    right    [name] is a 47 y.o. male.  Patient presents emergency department today for evaluation of right foot puncture wound.  Patient was wearing crocs and stepped on a piece of plywood had a nail in it.  The nail embedded into his foot.  It was a rusty nail.  Injury occurred about 45 minutes ago.  Patient bled a significant amount into his shoe.  Bleeding now stopped.  Unknown last tetanus.       Home Medications Prior to Admission medications   Medication Sig Start Date End Date Taking? Authorizing Provider  LATUDA 20 MG TABS tablet Take 20 mg by mouth daily. 05/19/18   [provider]  naproxen (NAPROSYN) 500 MG tablet Take 1 pill every 12 hours as needed for fevers and aches 01/04/19   Bethann Berkshire, MD  ondansetron (ZOFRAN ODT) 4 MG disintegrating tablet 4mg  ODT q4 hours prn nausea/vomit 01/04/19   Bethann Berkshire, MD  predniSONE (DELTASONE) 10 MG tablet Take 4 tablets (40 mg total) by mouth daily. 01/10/22   Vanetta Mulders, MD  SUBOXONE 8-2 MG FILM Place 1 Film under the tongue 2 (two) times daily. 05/30/18   [provider]      Allergies    Vicodin [hydrocodone-acetaminophen] and Adhesive [tape]    Review of Systems   Review of Systems  Physical Exam Updated Vital Signs BP (!) 142/96 (BP Location: Left Arm)   Pulse 89   Temp 98.2 F (36.8 C) (Oral)   Resp 20   Ht 6' (1.829 m)   Wt 113.4 kg   SpO2 96%   BMI 33.91 kg/m  Physical Exam Vitals and nursing note reviewed.  Constitutional:      Appearance: He is well-developed.  HENT:     Head: Normocephalic and atraumatic.  Eyes:     Conjunctiva/sclera: Conjunctivae normal.  Pulmonary:     Effort: No respiratory distress.  Musculoskeletal:     Cervical back: Normal range of motion and neck  supple.     Comments: Puncture wound noted to the sole of the right foot, no active bleeding.  Patient does have a fair amount of dried blood over the forefoot and toes.  Skin:    General: Skin is warm and dry.  Neurological:     Mental Status: He is alert.     ED Results / Procedures / Treatments   Labs (all labs ordered are listed, but only abnormal results are displayed) Labs Reviewed - No data to display  EKG None  Radiology No results found.  Procedures Procedures    Medications Ordered in ED Medications  Tdap (BOOSTRIX) injection 0.5 mL (has no administration in time range)  ciprofloxacin (CIPRO) tablet 500 mg (has no administration in time range)    ED Course/ Medical Decision Making/ A&P    Patient seen and examined. History obtained directly from patient.   Labs/EKG: None ordered  Imaging: Ordered x-ray of the foot.  Medications/Fluids: Ordered: Tdap, Cipro due to puncture wound through sole of shoe.   Most recent vital signs reviewed and are as follows: BP (!) 142/96 (BP Location: Left Arm)   Pulse 89   Temp 98.2 F (36.8 C) (Oral)   Resp 20   Ht 6' (1.829 m)  Wt 113.4 kg   SpO2 96%   BMI 33.91 kg/m   Initial impression: Puncture wound  9:23 PM Reassessment performed. Patient appears stable.  Wound bandaged.  Tetanus updated.  Imaging personally visualized and interpreted including: X-ray, I do not see any signs of fracture or foreign bodies.  Reviewed pertinent lab work and imaging with patient at bedside. Questions answered.   Most current vital signs reviewed and are as follows: BP (!) 142/96 (BP Location: Left Arm)   Pulse 89   Temp 98.2 F (36.8 C) (Oral)   Resp 20   Ht 6' (1.829 m)   Wt 113.4 kg   SpO2 96%   BMI 33.91 kg/m   Plan: Discharge to home.   Prescriptions written for: Ciprofloxacin x 5 days  Other home care instructions discussed: Wound care  ED return instructions discussed: Pt urged to return with worsening  pain, worsening swelling, expanding area of redness or streaking up extremity, fever, or any other concerns.  Urged to take complete course of antibiotics as prescribed.   Follow-up instructions discussed: Patient encouraged to follow-up with their PCP as needed.                                 Medical Decision Making Amount and/or Complexity of Data Reviewed Radiology: ordered.  Risk Prescription drug management.   Patient with puncture wound of foot.  X-ray appears negative.  Bleeding controlled.  Tetanus updated.  Patient will be given Cipro for prophylaxis as the nail went through his shoe.        Final Clinical Impression(s) / ED Diagnoses Final diagnoses:  Puncture wound    Rx / DC Orders ED Discharge Orders          Ordered    ciprofloxacin (CIPRO) 500 MG tablet  2 times daily        07/23/23 2123              Renne Crigler, PA-C 07/23/23 2125    Virgina Norfolk, DO 07/23/23 2202

## 2023-07-23 NOTE — ED Notes (Signed)
Wound care performed. Wound cleansed with NS. Bacitracin applied. Gauze dressing placed.

## 2023-07-23 NOTE — ED Triage Notes (Signed)
Pt to ED c/o right foot injury. Pt was outside and stepped on a piece of wood with nails in it. Wood slab stuck to pt crock shoe and penetrated foot. Reports 3 holes in the bottom of his shoe, pulled off plywood and shoe prior to arrival, bleeding controlled in triage. Unknown last tetanus shot

## 2024-08-27 ENCOUNTER — Other Ambulatory Visit: Payer: Self-pay

## 2024-08-27 ENCOUNTER — Emergency Department (HOSPITAL_COMMUNITY)
Admission: EM | Admit: 2024-08-27 | Discharge: 2024-08-27 | Disposition: A | Discharge status: Home / Self Care | Payer: Self-pay | Attending: Emergency Medicine | Admitting: Emergency Medicine

## 2024-08-27 ENCOUNTER — Encounter (HOSPITAL_COMMUNITY): Payer: Self-pay

## 2024-08-27 ENCOUNTER — Emergency Department (HOSPITAL_COMMUNITY): Payer: Self-pay

## 2024-08-27 DIAGNOSIS — L03115 Cellulitis of right lower limb: Secondary | ICD-10-CM | POA: Insufficient documentation

## 2024-08-27 DIAGNOSIS — D72829 Elevated white blood cell count, unspecified: Secondary | ICD-10-CM | POA: Insufficient documentation

## 2024-08-27 LAB — CBC WITH DIFFERENTIAL/PLATELET
Abs Immature Granulocytes: 0.05 K/uL (ref 0.00–0.07)
Basophils Absolute: 0.1 K/uL (ref 0.0–0.1)
Basophils Relative: 0 %
Eosinophils Absolute: 0 K/uL (ref 0.0–0.5)
Eosinophils Relative: 0 %
HCT: 42.3 % (ref 39.0–52.0)
Hemoglobin: 13.9 g/dL (ref 13.0–17.0)
Immature Granulocytes: 0 %
Lymphocytes Relative: 10 %
Lymphs Abs: 1.5 K/uL (ref 0.7–4.0)
MCH: 28.3 pg (ref 26.0–34.0)
MCHC: 32.9 g/dL (ref 30.0–36.0)
MCV: 86 fL (ref 80.0–100.0)
Monocytes Absolute: 1 K/uL (ref 0.1–1.0)
Monocytes Relative: 7 %
Neutro Abs: 12.3 K/uL — ABNORMAL HIGH (ref 1.7–7.7)
Neutrophils Relative %: 83 %
Platelets: 193 K/uL (ref 150–400)
RBC: 4.92 MIL/uL (ref 4.22–5.81)
RDW: 13.4 % (ref 11.5–15.5)
WBC: 14.9 K/uL — ABNORMAL HIGH (ref 4.0–10.5)
nRBC: 0 % (ref 0.0–0.2)

## 2024-08-27 LAB — COMPREHENSIVE METABOLIC PANEL WITH GFR
ALT: 79 U/L — ABNORMAL HIGH (ref 0–44)
AST: 91 U/L — ABNORMAL HIGH (ref 15–41)
Albumin: 4.4 g/dL (ref 3.5–5.0)
Alkaline Phosphatase: 202 U/L — ABNORMAL HIGH (ref 38–126)
Anion gap: 11 (ref 5–15)
BUN: 18 mg/dL (ref 6–20)
CO2: 25 mmol/L (ref 22–32)
Calcium: 9 mg/dL (ref 8.9–10.3)
Chloride: 103 mmol/L (ref 98–111)
Creatinine, Ser: 0.99 mg/dL (ref 0.61–1.24)
GFR, Estimated: >60 mL/min (ref >60)
Glucose, Bld: 99 mg/dL (ref 70–99)
Potassium: 3.8 mmol/L (ref 3.5–5.1)
Sodium: 138 mmol/L (ref 135–145)
Total Bilirubin: 0.9 mg/dL (ref 0.0–1.2)
Total Protein: 7.6 g/dL (ref 6.5–8.1)

## 2024-08-27 LAB — C-REACTIVE PROTEIN: CRP: 13.1 mg/dL — ABNORMAL HIGH (ref <1.0)

## 2024-08-27 LAB — SEDIMENTATION RATE: Sed Rate: 36 mm/h — ABNORMAL HIGH (ref 0–15)

## 2024-08-27 MED ORDER — OXYCODONE HCL 5 MG PO TABS
5.0000 mg | ORAL_TABLET | Freq: Once | ORAL | Status: AC
  Administered 2024-08-27: 5 mg via ORAL
  Filled 2024-08-27: qty 1

## 2024-08-27 MED ORDER — CLINDAMYCIN HCL 150 MG PO CAPS
300.0000 mg | ORAL_CAPSULE | Freq: Once | ORAL | Status: AC
  Administered 2024-08-27: 300 mg via ORAL
  Filled 2024-08-27: qty 2

## 2024-08-27 MED ORDER — CEPHALEXIN 500 MG PO CAPS: 500.0000 mg | ORAL_CAPSULE | Freq: Four times a day (QID) | ORAL | 0 refills | Status: AC

## 2024-08-27 MED ORDER — CEPHALEXIN 500 MG PO CAPS
500.0000 mg | ORAL_CAPSULE | Freq: Once | ORAL | Status: AC
  Administered 2024-08-27: 500 mg via ORAL
  Filled 2024-08-27: qty 1

## 2024-08-27 MED ORDER — CLINDAMYCIN HCL 150 MG PO CAPS: 150.0000 mg | ORAL_CAPSULE | Freq: Four times a day (QID) | ORAL | 0 refills | Status: AC

## 2024-08-27 NOTE — Discharge Instructions (Signed)
 [name]  Thank you for allowing us  to take care of you today.  You came to the Emergency Department today because you had worsening redness and pain on the outside of your right knee.  Here in the emergency department it does look like you have a soft tissue infection.  We are not seeing lots of fluid in your knee on x-ray or on ultrasound.  You have a soft tissue infection (cellulitis).  We did talk about the potential for a infection in your joint (septic arthritis).  Typically people would have more pain with ambulation, and fluid in their knee that is visible either on x-ray or ultrasound.  We talked about trying to get a sample of fluid, versus treating the soft tissue infection and having close follow-up.  You would rather treat the soft tissue infection and watch closely to see how you progress.  If you have worsening pain, redness, fever, if your whole knee becomes red swollen and painful, that would be a reason to come back to the emergency department, or if you do not have improvement after 48 hours of antibiotics or worsening despite the antibiotics.  You should follow-up closely with your primary care doctor, if you are not able to get in with your primary care doctor, you should come back to the ED or go to urgent care for reevaluation within the next 3 to 5 days  To-Do: 1. Please follow-up with your primary doctor within 2 days / as soon as possible.   Please return to the Emergency Department or call 911 if you experience have worsening of your symptoms, or do not get better, chest pain, shortness of breath, severe or significantly worsening pain, high fever, severe confusion, pass out or have any reason to think that you need emergency medical care.   We hope you feel better soon.   Mitzie Later, MD Department of Emergency Medicine [facility]

## 2024-08-27 NOTE — ED Provider Notes (Signed)
 [place] EMERGENCY DEPARTMENT AT [facility] Provider Note   CSN: [id number] Arrival date & time: 08/27/24  1000     History Chief Complaint  Patient presents with   Knee Pain    HPI: [name] is a 48 y.o. male with history pertinent for prior IVDU in remission for 7 years, prior bacteremia, candidemia who presents complaining of left knee pain. Patient arrived via POV accompanied by son, [name].  History provided by patient.  No interpreter required during this encounter.  Patient reports that several days ago he was working on renovating a bathroom.  Reports that he was on his knees for a significant amount of time, and was also installing installation.  Reports there is also numerous insects in the area, and believes that he may have experienced a spider bite to his right knee.  Reports that he has had progressive pain over the past several days, worse with ambulation.  Reports that he also believes that he may have gotten insulation under his fingernails and/or on his knee, and he has been scratching at it.  Reports that he had an area of punctate erythema on the lateral aspect of his right knee last night, however this morning the erythema had increased.  Reports that he has a history of infections that became severe when he was utilizing recreational substances, therefore he wanted to come to the emergency department to be evaluated.  Patient's recorded medical, surgical, social, medication list and allergies were reviewed in the Snapshot window as part of the initial history.   Prior to Admission medications   Medication Sig Start Date End Date Taking? Authorizing Provider  cephALEXin  (KEFLEX ) 500 MG capsule Take 1 capsule (500 mg total) by mouth 4 (four) times daily for 5 days. 08/27/24 09/01/24 Yes Rogelia Jerilynn RAMAN, MD  clindamycin  (CLEOCIN ) 150 MG capsule Take 1 capsule (150 mg total) by mouth every 6 (six) hours for 5 days. 08/27/24 09/01/24 Yes Rogelia Jerilynn RAMAN,  MD  ciprofloxacin  (CIPRO ) 500 MG tablet Take 1 tablet (500 mg total) by mouth 2 (two) times daily. 07/23/23   Desiderio Chew, PA-C  LATUDA 20 MG TABS tablet Take 20 mg by mouth daily. 05/19/18   [provider]  naproxen  (NAPROSYN ) 500 MG tablet Take 1 pill every 12 hours as needed for fevers and aches 01/04/19   Suzette Pac, MD  ondansetron  (ZOFRAN  ODT) 4 MG disintegrating tablet 4mg  ODT q4 hours prn nausea/vomit 01/04/19   Zammit, Joseph, MD  predniSONE  (DELTASONE ) 10 MG tablet Take 4 tablets (40 mg total) by mouth daily. 01/10/22   Zackowski, Scott, MD  SUBOXONE 8-2 MG FILM Place 1 Film under the tongue 2 (two) times daily. 05/30/18   [provider]     Allergies: Vicodin [hydrocodone -acetaminophen ] and Adhesive [tape]   Review of Systems   ROS as per HPI  Physical Exam Updated Vital Signs BP 131/79   Pulse 86   Temp 98.5 F (36.9 C) (Oral)   Resp 18   Ht 6' (1.829 m)   Wt 108.9 kg   SpO2 97%   BMI 32.55 kg/m  Physical Exam Vitals and nursing note reviewed.  Constitutional:      General: He is not in acute distress.    Appearance: He is well-developed.  HENT:     Head: Normocephalic and atraumatic.  Eyes:     Conjunctiva/sclera: Conjunctivae normal.  Cardiovascular:     Rate and Rhythm: Normal rate and regular rhythm.  Heart sounds: No murmur heard. Pulmonary:     Effort: Pulmonary effort is normal. No respiratory distress.     Breath sounds: Normal breath sounds.  Abdominal:     Palpations: Abdomen is soft.     Tenderness: There is no abdominal tenderness.  Musculoskeletal:        General: No swelling.     Cervical back: Neck supple.     Right knee: Swelling and erythema (Lateral aspect, see image) present. No deformity or effusion.     Left knee: Normal.  Skin:    General: Skin is warm and dry.     Capillary Refill: Capillary refill takes less than 2 seconds.  Neurological:     Mental Status: He is alert.     Gait: Gait normal.   Psychiatric:        Mood and Affect: Mood normal.        ED Course/ Medical Decision Making/ A&P   Procedures .Ultrasound ED Soft Tissue  Date/Time: 08/27/2024 4:40 PM  Performed by: Rogelia Jerilynn RAMAN, MD Authorized by: Rogelia Jerilynn RAMAN, MD   Procedure details:    Indications: limb pain and evaluate for cellulitis     Transverse view:  Visualized   Longitudinal view:  Visualized   Images: archived   Location:    Location comment:  Right knee   : Medial and lateral aspect knee. Findings:     cellulitis present Comments:     On ultrasound, there is no appreciable joint effusion, there is cobblestoning and increased fluid in the superficial soft tissue planes consistent with cellulitis, there is no focal fluid collection concerning for abscess    Medications Ordered in ED Medications  oxyCODONE  (Oxy IR/ROXICODONE ) immediate release tablet 5 mg (5 mg Oral Given 08/27/24 1056)  clindamycin  (CLEOCIN ) capsule 300 mg (300 mg Oral Given 08/27/24 1236)  cephALEXin  (KEFLEX ) capsule 500 mg (500 mg Oral Given 08/27/24 1236)    Medical Decision Making:   [name] is a 48 y.o. male who presents for right knee pain as per above.  Physical exam is pertinent for erythema, swelling, tenderness of the lateral aspect right knee.   The differential includes but is not limited to cellulitis, abscess, septic arthritis, gout.  Independent historian: None  External data reviewed: No pertinent external data  Initial Plan:   Screening labs including CBC and Metabolic panel to evaluate for infectious or metabolic etiology of disease.  ESR and CRP to further restratify for septic arthritis  X-ray of the knee to evaluate for effusion or bony derangement  Labs: Ordered, Independent interpretation, and Details: CBC with slight leukocytosis with left shift.  No anemia or thrombocytopenia, CMP without AKI, emergent electrolyte derangement, mild elevation of LFTs which patient previously has  had.  ESR mildly elevated to 36, CRP in process at time of discharge  Radiology: Ordered, Independent interpretation, Details: Personally reviewed plain films of the knee, do not appreciate displaced fracture, dislocation or knee effusion, and All images reviewed independently.  Agree with radiology report at this time.   DG Knee Complete 4 Views Right Result Date: 08/27/2024 CLINICAL DATA:  Right lateral knee pain, swelling and redness. Worked on his knees remodeling a bathroom a few days ago. EXAM: RIGHT KNEE - COMPLETE 4+ VIEW COMPARISON:  None Available. FINDINGS: Diffuse anterior and lateral subcutaneous edema. Mild medial joint space narrowing and spur formation. Mild tibial spine spur formation. No soft tissue gas, bone destruction, periosteal reaction, fracture or effusion seen. IMPRESSION: 1. Diffuse  anterior and lateral subcutaneous edema. 2. Mild medial degenerative changes. Electronically Signed   By: Elspeth Bathe M.D.   On: 08/27/2024 11:07    EKG/Medicine tests: Not indicated EKG Interpretation:    Interventions: Oxycodone , clindamycin , Keflex   See the EMR for full details regarding lab and imaging results.  Patient presents for right knee pain.  Evidence of cellulitis on exam, no focal fluid collection on exam or on ultrasound concerning for abscess.  Did consider septic arthritis, however patient able to ambulate and range knee, no fever, no effusion on ultrasound nor on x-ray.  Patient does have elevation of ESR which is more consistent with cellulitis as well as mild leukocytosis also consistent with cellulitis.  Exam and ultrasound revealed cellulitis as well.  Did discuss with patient that there is a possibility of underlying septic arthritis, however given lack of effusion, I feel that this is overall less likely, and do not feel that patient requires arthrocentesis at this time given workup is consistent with cellulitis.  Discussed risks and benefits of arthrocentesis, patient  would like to defer at this time and trial course of p.o. antibiotics for cellulitis, I believe that this is reasonable.  Will prescribe Keflex  as well as clindamycin , first dose provided in the ED, discharged in stable condition with plan for close follow-up with PCP.  Presentation is most consistent with acute complicated illness and I did consider and rule out acute life/limb-threatening illness  Discussion of management or test interpretations with external provider(s): Not indicated  Risk Drugs:Prescription drug management  Disposition: DISCHARGE: I believe that the patient is safe for discharge home with outpatient follow-up. Patient was informed of all pertinent physical exam, laboratory, and imaging findings. Patient's suspected etiology of their symptom presentation was discussed with the patient and all questions were answered. We discussed following up with PCP. I provided thorough ED return precautions. The patient feels safe and comfortable with this plan.  MDM generated using voice dictation software and may contain dictation errors.  Please contact me for any clarification or with any questions.  Clinical Impression:  1. Cellulitis of knee, right      Discharge   Final Clinical Impression(s) / ED Diagnoses Final diagnoses:  Cellulitis of knee, right    Rx / DC Orders ED Discharge Orders          Ordered    cephALEXin  (KEFLEX ) 500 MG capsule  4 times daily        08/27/24 1255    clindamycin  (CLEOCIN ) 150 MG capsule  Every 6 hours        08/27/24 1255             Rogelia Jerilynn RAMAN, MD 08/27/24 1646

## 2024-08-27 NOTE — ED Triage Notes (Signed)
 Pt arrived via POV from home c/o right knee pain and swelling. 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
